# Patient Record
Sex: Female | Born: 1985 | Race: White | Hispanic: No | Smoking: Former smoker
Health system: Southern US, Community
[De-identification: ages and names within clinical notes are randomized; demographics above are authoritative.]

## PROBLEM LIST (undated history)

## (undated) ENCOUNTER — Inpatient Hospital Stay: Payer: Self-pay

## (undated) ENCOUNTER — Ambulatory Visit: Admission: EM | Payer: Medicaid Other | Source: Home / Self Care

## (undated) DIAGNOSIS — D509 Iron deficiency anemia, unspecified: Secondary | ICD-10-CM

## (undated) DIAGNOSIS — G43909 Migraine, unspecified, not intractable, without status migrainosus: Secondary | ICD-10-CM

## (undated) DIAGNOSIS — M179 Osteoarthritis of knee, unspecified: Secondary | ICD-10-CM

## (undated) DIAGNOSIS — O24419 Gestational diabetes mellitus in pregnancy, unspecified control: Secondary | ICD-10-CM

## (undated) DIAGNOSIS — F1110 Opioid abuse, uncomplicated: Secondary | ICD-10-CM

## (undated) DIAGNOSIS — R519 Headache, unspecified: Secondary | ICD-10-CM

## (undated) DIAGNOSIS — R51 Headache: Secondary | ICD-10-CM

## (undated) DIAGNOSIS — M2241 Chondromalacia patellae, right knee: Secondary | ICD-10-CM

## (undated) DIAGNOSIS — F32A Depression, unspecified: Secondary | ICD-10-CM

## (undated) HISTORY — DX: Depression, unspecified: F32.A

## (undated) HISTORY — DX: Morbid (severe) obesity due to excess calories: E66.01

## (undated) HISTORY — DX: Chondromalacia patellae, right knee: M22.41

## (undated) HISTORY — DX: Iron deficiency anemia, unspecified: D50.9

## (undated) HISTORY — DX: Osteoarthritis of knee, unspecified: M17.9

## (undated) HISTORY — DX: Migraine, unspecified, not intractable, without status migrainosus: G43.909

## (undated) HISTORY — DX: Gestational diabetes mellitus in pregnancy, unspecified control: O24.419

---

## 2005-03-03 ENCOUNTER — Ambulatory Visit: Payer: Self-pay | Admitting: Otolaryngology

## 2005-12-07 HISTORY — PX: TONSILLECTOMY: SUR1361

## 2007-03-24 ENCOUNTER — Emergency Department: Payer: Self-pay | Admitting: Internal Medicine

## 2007-07-05 ENCOUNTER — Emergency Department: Payer: Self-pay | Admitting: Emergency Medicine

## 2007-08-01 ENCOUNTER — Emergency Department: Payer: Self-pay | Admitting: Emergency Medicine

## 2008-03-17 ENCOUNTER — Other Ambulatory Visit: Payer: Self-pay

## 2008-03-17 ENCOUNTER — Emergency Department: Payer: Self-pay | Admitting: Emergency Medicine

## 2008-03-23 ENCOUNTER — Emergency Department: Payer: Self-pay | Admitting: Emergency Medicine

## 2008-10-17 ENCOUNTER — Emergency Department: Payer: Self-pay | Admitting: Emergency Medicine

## 2008-10-19 ENCOUNTER — Ambulatory Visit: Payer: Self-pay | Admitting: Emergency Medicine

## 2008-10-19 ENCOUNTER — Emergency Department: Payer: Self-pay | Admitting: Emergency Medicine

## 2008-10-31 ENCOUNTER — Emergency Department: Payer: Self-pay | Admitting: Internal Medicine

## 2008-11-02 ENCOUNTER — Ambulatory Visit: Payer: Self-pay | Admitting: Unknown Physician Specialty

## 2009-01-11 ENCOUNTER — Inpatient Hospital Stay: Payer: Self-pay | Admitting: Psychiatry

## 2009-04-01 ENCOUNTER — Emergency Department: Payer: Self-pay | Admitting: Unknown Physician Specialty

## 2009-07-06 ENCOUNTER — Emergency Department: Payer: Self-pay | Admitting: Emergency Medicine

## 2009-08-04 ENCOUNTER — Emergency Department: Payer: Self-pay | Admitting: Emergency Medicine

## 2009-09-02 ENCOUNTER — Observation Stay: Payer: Self-pay | Admitting: Surgery

## 2009-09-15 ENCOUNTER — Observation Stay: Payer: Self-pay

## 2009-10-07 ENCOUNTER — Encounter: Payer: Self-pay | Admitting: Maternal & Fetal Medicine

## 2009-10-23 ENCOUNTER — Observation Stay: Payer: Self-pay | Admitting: Obstetrics and Gynecology

## 2009-11-09 ENCOUNTER — Emergency Department: Payer: Self-pay | Admitting: Emergency Medicine

## 2009-11-25 ENCOUNTER — Ambulatory Visit: Payer: Self-pay

## 2009-12-07 ENCOUNTER — Ambulatory Visit: Payer: Self-pay

## 2009-12-21 ENCOUNTER — Observation Stay: Payer: Self-pay | Admitting: Obstetrics and Gynecology

## 2009-12-31 ENCOUNTER — Observation Stay: Payer: Self-pay

## 2010-01-07 ENCOUNTER — Ambulatory Visit: Payer: Self-pay

## 2010-01-15 ENCOUNTER — Observation Stay: Payer: Self-pay | Admitting: Obstetrics and Gynecology

## 2010-01-19 ENCOUNTER — Inpatient Hospital Stay: Payer: Self-pay | Admitting: Obstetrics and Gynecology

## 2010-06-01 ENCOUNTER — Emergency Department: Payer: Self-pay | Admitting: Emergency Medicine

## 2010-06-02 ENCOUNTER — Emergency Department: Payer: Self-pay | Admitting: Emergency Medicine

## 2010-06-03 ENCOUNTER — Emergency Department: Payer: Self-pay

## 2010-06-09 ENCOUNTER — Emergency Department: Payer: Self-pay | Admitting: Emergency Medicine

## 2010-08-20 ENCOUNTER — Emergency Department: Payer: Self-pay | Admitting: Emergency Medicine

## 2010-09-26 ENCOUNTER — Emergency Department: Payer: Self-pay | Admitting: Emergency Medicine

## 2010-10-26 ENCOUNTER — Emergency Department: Payer: Self-pay | Admitting: Emergency Medicine

## 2010-11-09 ENCOUNTER — Emergency Department: Payer: Self-pay | Admitting: Unknown Physician Specialty

## 2010-12-07 HISTORY — PX: ANKLE SURGERY: SHX546

## 2011-03-15 ENCOUNTER — Emergency Department: Payer: Self-pay | Admitting: Emergency Medicine

## 2011-03-20 ENCOUNTER — Inpatient Hospital Stay: Payer: Self-pay | Admitting: Psychiatry

## 2011-04-30 ENCOUNTER — Emergency Department: Payer: Self-pay | Admitting: Emergency Medicine

## 2011-05-18 ENCOUNTER — Emergency Department: Payer: Self-pay | Admitting: Unknown Physician Specialty

## 2011-05-22 ENCOUNTER — Emergency Department: Payer: Self-pay | Admitting: Emergency Medicine

## 2011-05-26 ENCOUNTER — Emergency Department: Payer: Self-pay | Admitting: *Deleted

## 2011-06-08 ENCOUNTER — Emergency Department: Payer: Self-pay | Admitting: Emergency Medicine

## 2011-06-16 ENCOUNTER — Emergency Department: Payer: Self-pay | Admitting: Emergency Medicine

## 2011-07-26 ENCOUNTER — Inpatient Hospital Stay: Payer: Self-pay | Admitting: Psychiatry

## 2011-08-04 ENCOUNTER — Inpatient Hospital Stay: Payer: Self-pay | Admitting: Psychiatry

## 2011-08-19 ENCOUNTER — Emergency Department: Payer: Self-pay | Admitting: *Deleted

## 2011-08-23 ENCOUNTER — Inpatient Hospital Stay: Payer: Self-pay | Admitting: Psychiatry

## 2011-09-25 ENCOUNTER — Emergency Department: Payer: Self-pay | Admitting: Emergency Medicine

## 2011-09-28 ENCOUNTER — Inpatient Hospital Stay: Payer: Self-pay | Admitting: Unknown Physician Specialty

## 2011-10-11 ENCOUNTER — Emergency Department: Payer: Self-pay | Admitting: Emergency Medicine

## 2011-12-08 HISTORY — PX: DILATION AND CURETTAGE OF UTERUS: SHX78

## 2011-12-31 ENCOUNTER — Emergency Department: Payer: Self-pay | Admitting: Emergency Medicine

## 2011-12-31 LAB — COMPREHENSIVE METABOLIC PANEL
Alkaline Phosphatase: 68 U/L (ref 50–136)
BUN: 11 mg/dL (ref 7–18)
Bilirubin,Total: 0.3 mg/dL (ref 0.2–1.0)
Calcium, Total: 8.9 mg/dL (ref 8.5–10.1)
Chloride: 107 mmol/L (ref 98–107)
Co2: 26 mmol/L (ref 21–32)
Creatinine: 0.9 mg/dL (ref 0.60–1.30)
EGFR (Non-African Amer.): >60
Glucose: 85 mg/dL (ref 65–99)
SGOT(AST): 25 U/L (ref 15–37)
SGPT (ALT): 35 U/L
Sodium: 142 mmol/L (ref 136–145)

## 2011-12-31 LAB — URINALYSIS, COMPLETE
Bacteria: NONE SEEN
Blood: NEGATIVE
Glucose,UR: NEGATIVE mg/dL (ref 0–75)
Leukocyte Esterase: NEGATIVE
Nitrite: NEGATIVE
Ph: 6 (ref 4.5–8.0)
Protein: NEGATIVE
Specific Gravity: 1.02 (ref 1.003–1.030)
WBC UR: 1 /HPF (ref 0–5)

## 2011-12-31 LAB — CBC
HCT: 41.2 % (ref 35.0–47.0)
MCHC: 32.7 g/dL (ref 32.0–36.0)
Platelet: 260 10*3/uL (ref 150–440)
RBC: 4.79 10*6/uL (ref 3.80–5.20)
RDW: 17 % — ABNORMAL HIGH (ref 11.5–14.5)
WBC: 8 10*3/uL (ref 3.6–11.0)

## 2011-12-31 LAB — DRUG SCREEN, URINE
Amphetamines, Ur Screen: NEGATIVE (ref ?–1000)
Cocaine Metabolite,Ur ~~LOC~~: NEGATIVE (ref ?–300)
MDMA (Ecstasy)Ur Screen: NEGATIVE (ref ?–500)
Methadone, Ur Screen: NEGATIVE (ref ?–300)
Opiate, Ur Screen: NEGATIVE (ref ?–300)
Phencyclidine (PCP) Ur S: POSITIVE (ref ?–25)
Tricyclic, Ur Screen: NEGATIVE (ref ?–1000)

## 2011-12-31 LAB — ETHANOL: Ethanol %: <0.003 % (ref 0.000–0.080)

## 2011-12-31 LAB — TSH: Thyroid Stimulating Horm: 1.7 u[IU]/mL

## 2012-01-21 LAB — URINALYSIS, COMPLETE
Bilirubin,UR: NEGATIVE
Blood: NEGATIVE
Glucose,UR: NEGATIVE mg/dL (ref 0–75)
Leukocyte Esterase: NEGATIVE
Nitrite: NEGATIVE
Protein: NEGATIVE
WBC UR: <1 /HPF (ref 0–5)

## 2012-01-21 LAB — TSH: Thyroid Stimulating Horm: 2.45 u[IU]/mL

## 2012-01-21 LAB — COMPREHENSIVE METABOLIC PANEL
Albumin: 4.6 g/dL (ref 3.4–5.0)
Alkaline Phosphatase: 68 U/L (ref 50–136)
BUN: 12 mg/dL (ref 7–18)
Bilirubin,Total: 0.3 mg/dL (ref 0.2–1.0)
Creatinine: 0.97 mg/dL (ref 0.60–1.30)
EGFR (Non-African Amer.): >60
Glucose: 76 mg/dL (ref 65–99)
Osmolality: 282 (ref 275–301)
Sodium: 142 mmol/L (ref 136–145)
Total Protein: 8.7 g/dL — ABNORMAL HIGH (ref 6.4–8.2)

## 2012-01-21 LAB — CBC
HCT: 45.6 % (ref 35.0–47.0)
HGB: 14.9 g/dL (ref 12.0–16.0)
MCH: 27.9 pg (ref 26.0–34.0)
MCHC: 32.6 g/dL (ref 32.0–36.0)
RDW: 17.6 % — ABNORMAL HIGH (ref 11.5–14.5)
WBC: 11.1 10*3/uL — ABNORMAL HIGH (ref 3.6–11.0)

## 2012-01-21 LAB — DRUG SCREEN, URINE
Amphetamines, Ur Screen: NEGATIVE (ref ?–1000)
Barbiturates, Ur Screen: NEGATIVE (ref ?–200)
Benzodiazepine, Ur Scrn: NEGATIVE (ref ?–200)
MDMA (Ecstasy)Ur Screen: NEGATIVE (ref ?–500)
Methadone, Ur Screen: NEGATIVE (ref ?–300)
Phencyclidine (PCP) Ur S: POSITIVE (ref ?–25)
Tricyclic, Ur Screen: POSITIVE (ref ?–1000)

## 2012-01-21 LAB — ETHANOL
Ethanol %: <0.003 % (ref 0.000–0.080)
Ethanol: <3 mg/dL

## 2012-01-21 LAB — SALICYLATE LEVEL: Salicylates, Serum: 2.9 mg/dL — ABNORMAL HIGH

## 2012-01-21 LAB — PREGNANCY, URINE: Pregnancy Test, Urine: NEGATIVE m[IU]/mL

## 2012-01-22 ENCOUNTER — Inpatient Hospital Stay: Payer: Self-pay | Admitting: Psychiatry

## 2012-02-11 ENCOUNTER — Emergency Department: Payer: Self-pay | Admitting: Emergency Medicine

## 2012-04-15 ENCOUNTER — Ambulatory Visit: Payer: Self-pay | Admitting: Specialist

## 2012-04-20 ENCOUNTER — Ambulatory Visit: Payer: Self-pay | Admitting: Specialist

## 2012-04-20 LAB — HEMOGLOBIN: HGB: 11.7 g/dL — ABNORMAL LOW (ref 12.0–16.0)

## 2012-04-20 LAB — DRUG SCREEN, URINE
Benzodiazepine, Ur Scrn: NEGATIVE (ref ?–200)
Cannabinoid 50 Ng, Ur ~~LOC~~: NEGATIVE (ref ?–50)
Cocaine Metabolite,Ur ~~LOC~~: NEGATIVE (ref ?–300)
MDMA (Ecstasy)Ur Screen: POSITIVE (ref ?–500)
Tricyclic, Ur Screen: NEGATIVE (ref ?–1000)

## 2012-04-20 LAB — PREGNANCY, URINE: Pregnancy Test, Urine: NEGATIVE m[IU]/mL

## 2012-04-21 LAB — HEMATOCRIT: HCT: 36.2 % (ref 35.0–47.0)

## 2012-05-26 IMAGING — US US PELV - US TRANSVAGINAL
1 series · 17 of 25 positions shown · non-contrast
Comparison: none

REASON FOR EXAM: left lower quadrant pain, please eval for torsion with
doppler flow
COMMENTS:

PROCEDURE:     US  - US PELVIS EXAM W/TRANSVAGINAL  - June 10, 2010  [DATE]
RESULT:
HISTORY: Left lower quadrant pain.

[Series 1: us pelv - us transvaginal · 17 of 57 slices shown]
[im 1/57]
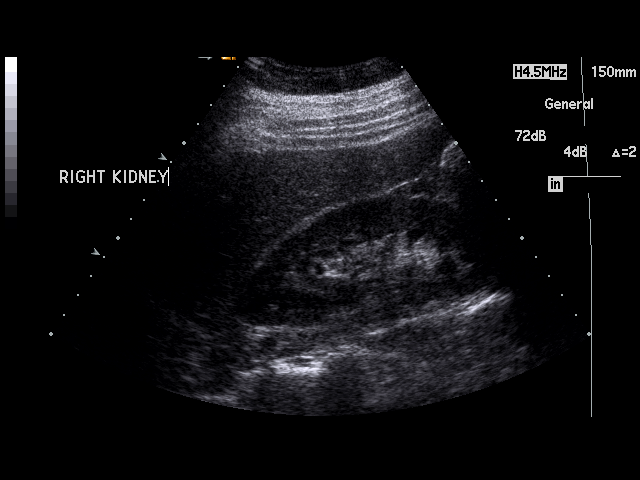
[im 5/57]
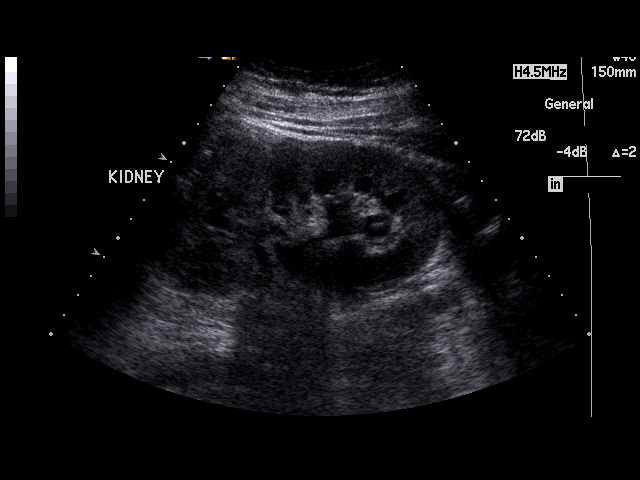
[im 8/57]
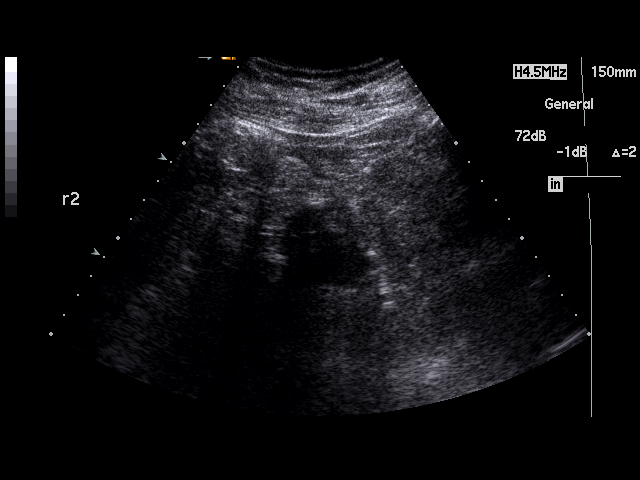
[im 12/57]
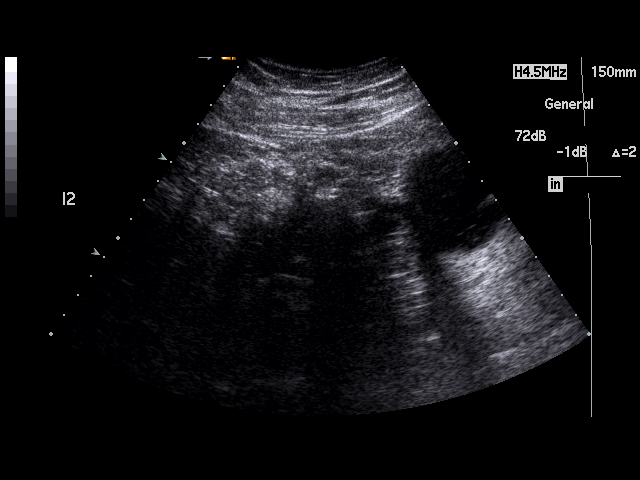
[im 15/57]
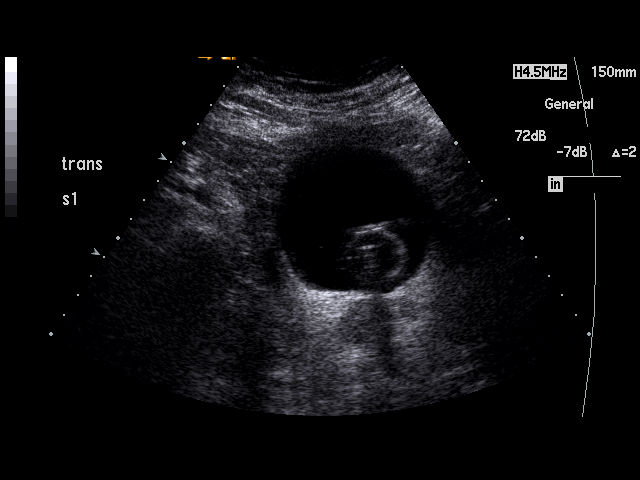
[im 19/57]
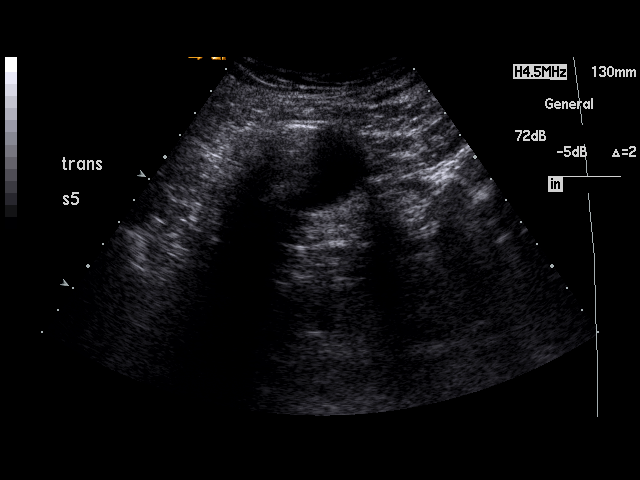
[im 22/57]
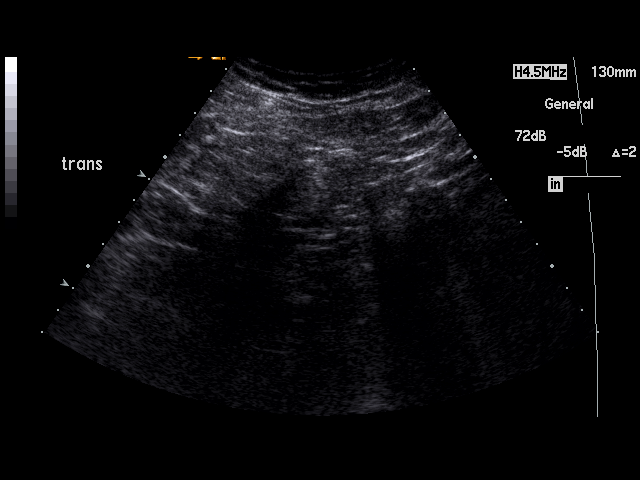
[im 26/57]
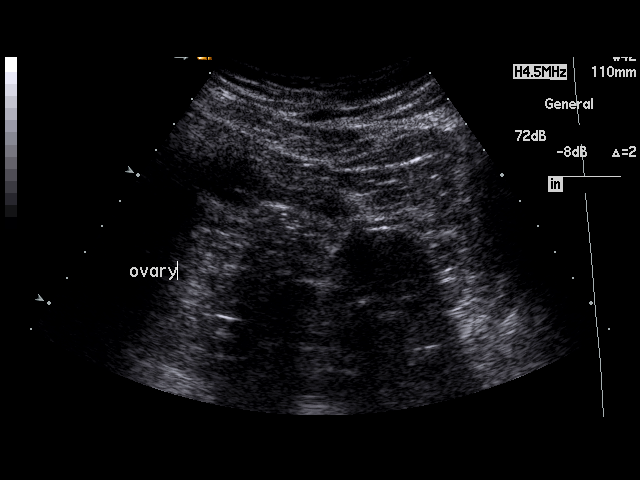
[im 29/57]
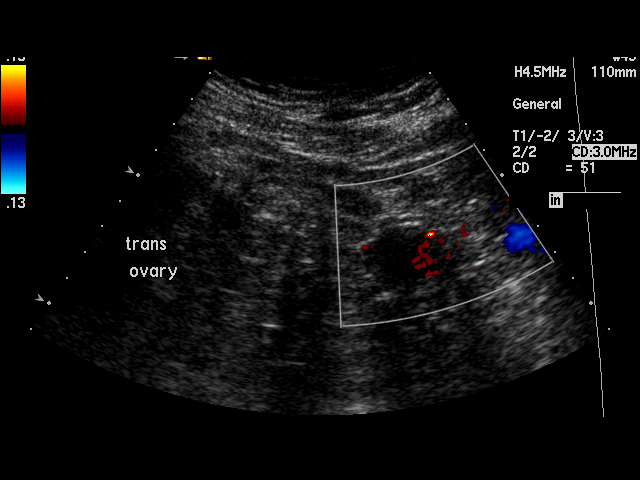
[im 31/57]
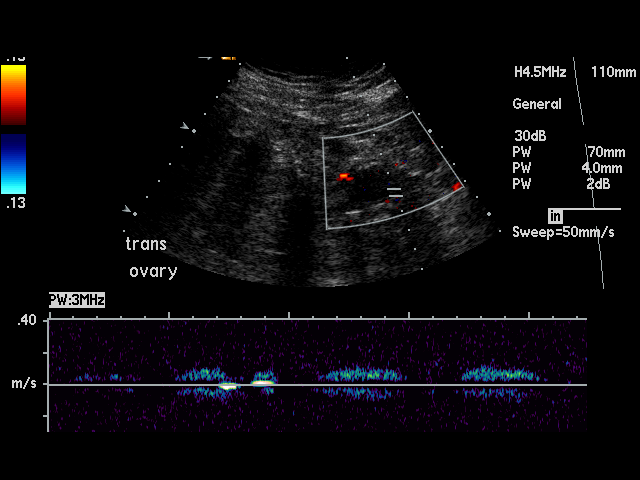
[im 36/57]
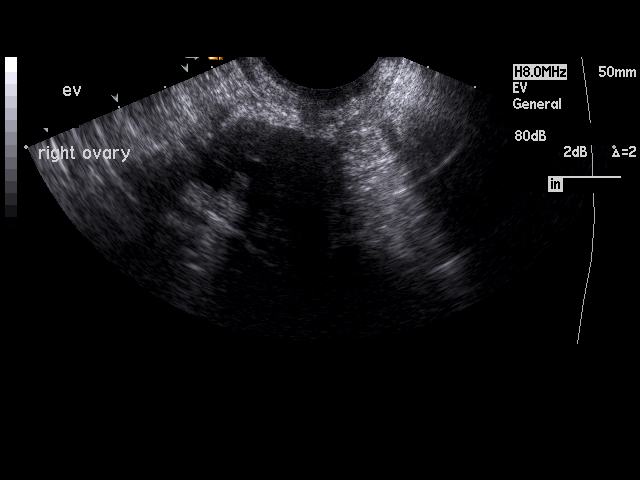
[im 38/57]
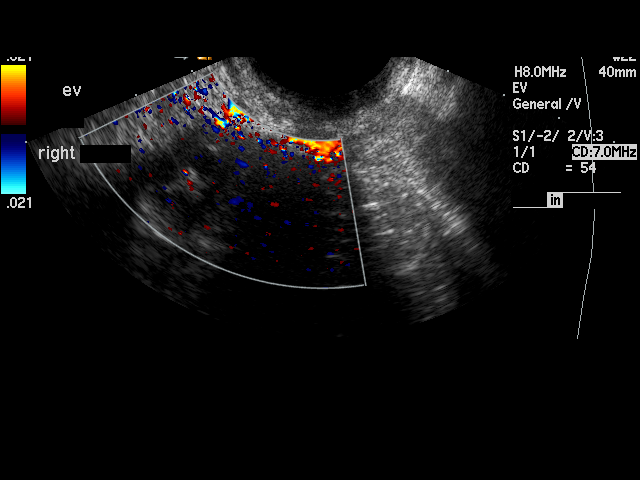
[im 43/57]
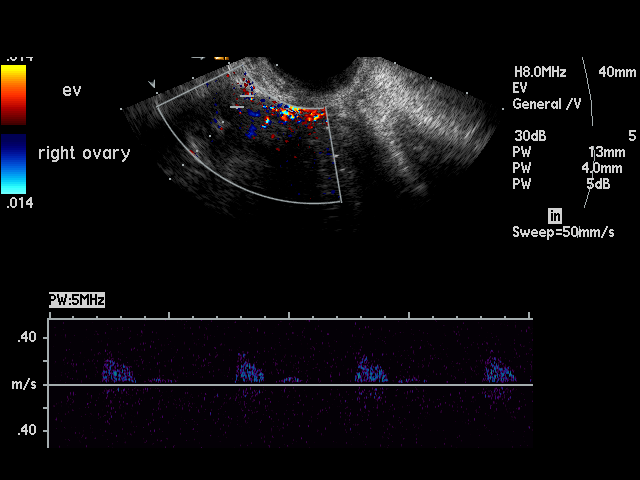
[im 45/57]
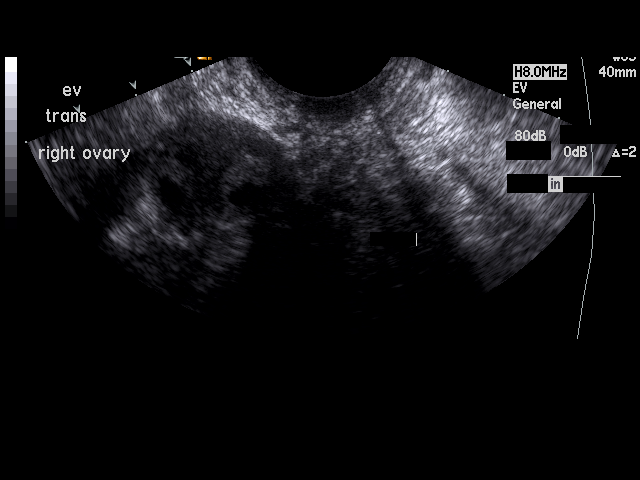
[im 50/57]
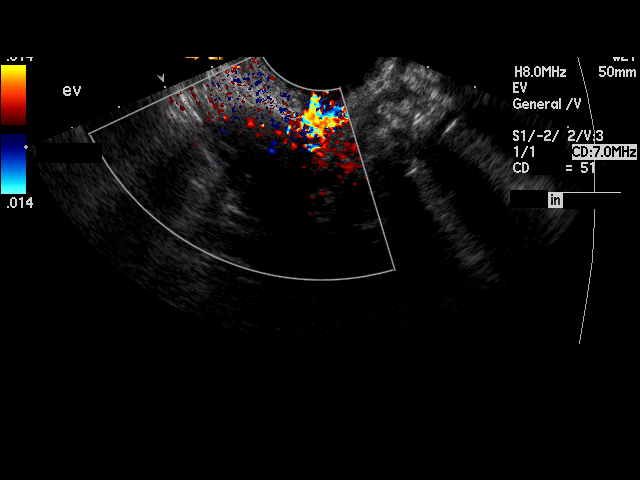
[im 52/57]
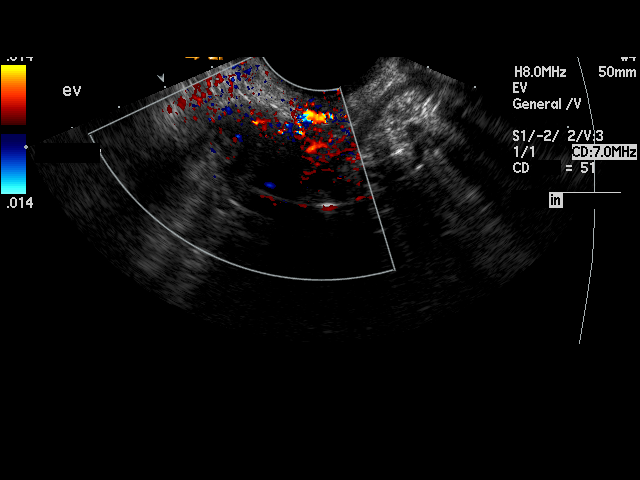
[im 57/57]
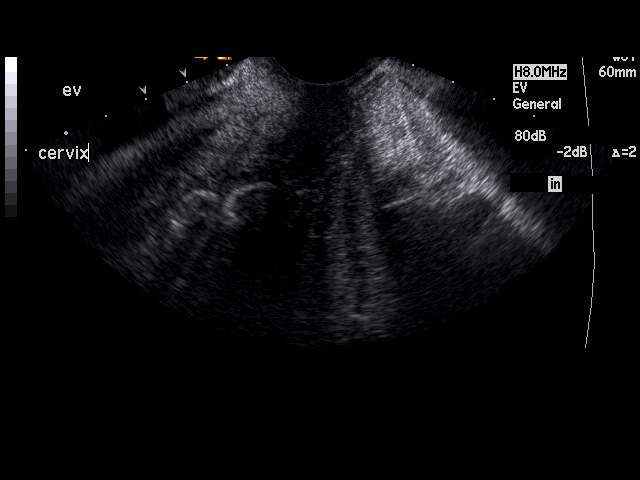

[17 of 25 positions shown; findings below may reference images not displayed]

FINDINGS: The uterus measures 8.1 x 3.5 x 4.6 cm. The endometrial stripe
is 3 mm and is normal. No free fluid is noted. The right ovary measures
cm in maximum diameter and the left 2.5. No abnormal flow signal is noted.
No evidence of torsion. Minimal hydronephrosis is noted in the left kidney
with a tiny punctate calcification in the lower pole most likely tiny renal
calculus.
IMPRESSION: 1. Normal pelvic ultrasound No evidence of torsion.
2. Mild hydronephrosis on the left with tiny punctate left lower pole
calyceal stone.

## 2012-06-30 ENCOUNTER — Encounter: Payer: Self-pay | Admitting: Obstetrics & Gynecology

## 2012-07-06 ENCOUNTER — Ambulatory Visit: Payer: Self-pay | Admitting: Obstetrics and Gynecology

## 2012-07-06 LAB — CBC
HCT: 39.3 % (ref 35.0–47.0)
HGB: 13.3 g/dL (ref 12.0–16.0)
MCHC: 33.8 g/dL (ref 32.0–36.0)
MCV: 86 fL (ref 80–100)
WBC: 8.6 10*3/uL (ref 3.6–11.0)

## 2012-07-06 LAB — COMPREHENSIVE METABOLIC PANEL
Albumin: 3.5 g/dL (ref 3.4–5.0)
Alkaline Phosphatase: 76 U/L (ref 50–136)
Anion Gap: 4 — ABNORMAL LOW (ref 7–16)
BUN: 8 mg/dL (ref 7–18)
Calcium, Total: 8.7 mg/dL (ref 8.5–10.1)
Glucose: 84 mg/dL (ref 65–99)
Osmolality: 277 (ref 275–301)
Potassium: 3.8 mmol/L (ref 3.5–5.1)
SGOT(AST): 20 U/L (ref 15–37)
SGPT (ALT): 23 U/L
Total Protein: 7 g/dL (ref 6.4–8.2)

## 2012-07-07 ENCOUNTER — Ambulatory Visit: Payer: Self-pay | Admitting: Obstetrics and Gynecology

## 2012-07-27 LAB — COMPREHENSIVE METABOLIC PANEL
Albumin: 4.2 g/dL (ref 3.4–5.0)
Alkaline Phosphatase: 98 U/L (ref 50–136)
Anion Gap: 4 — ABNORMAL LOW (ref 7–16)
BUN: 12 mg/dL (ref 7–18)
Bilirubin,Total: 0.3 mg/dL (ref 0.2–1.0)
EGFR (African American): >60
Glucose: 81 mg/dL (ref 65–99)
Potassium: 3.7 mmol/L (ref 3.5–5.1)
SGOT(AST): 77 U/L — ABNORMAL HIGH (ref 15–37)
SGPT (ALT): 69 U/L (ref 12–78)
Total Protein: 8.2 g/dL (ref 6.4–8.2)

## 2012-07-27 LAB — URINALYSIS, COMPLETE
Blood: NEGATIVE
Nitrite: NEGATIVE
Ph: 5 (ref 4.5–8.0)
Protein: NEGATIVE
Specific Gravity: 1.038 (ref 1.003–1.030)
WBC UR: 3 /HPF (ref 0–5)

## 2012-07-27 LAB — CBC
HGB: 13.8 g/dL (ref 12.0–16.0)
MCH: 28.3 pg (ref 26.0–34.0)
MCHC: 32.7 g/dL (ref 32.0–36.0)
MCV: 86 fL (ref 80–100)
RDW: 15.7 % — ABNORMAL HIGH (ref 11.5–14.5)
WBC: 11 10*3/uL (ref 3.6–11.0)

## 2012-07-27 LAB — DRUG SCREEN, URINE
Benzodiazepine, Ur Scrn: POSITIVE (ref ?–200)
Cannabinoid 50 Ng, Ur ~~LOC~~: NEGATIVE (ref ?–50)
Cocaine Metabolite,Ur ~~LOC~~: NEGATIVE (ref ?–300)
MDMA (Ecstasy)Ur Screen: NEGATIVE (ref ?–500)
Opiate, Ur Screen: POSITIVE (ref ?–300)
Phencyclidine (PCP) Ur S: POSITIVE (ref ?–25)

## 2012-07-27 LAB — ETHANOL
Ethanol %: <0.003 % (ref 0.000–0.080)
Ethanol: <3 mg/dL

## 2012-07-27 LAB — TSH: Thyroid Stimulating Horm: 2.47 u[IU]/mL

## 2012-07-27 LAB — ACETAMINOPHEN LEVEL: Acetaminophen: <2 ug/mL

## 2012-07-28 ENCOUNTER — Inpatient Hospital Stay: Payer: Self-pay | Admitting: Psychiatry

## 2012-07-29 LAB — LIPID PANEL
Ldl Cholesterol, Calc: 98 mg/dL (ref 0–100)
VLDL Cholesterol, Calc: 70 mg/dL — ABNORMAL HIGH (ref 5–40)

## 2012-07-29 LAB — FOLATE: Folic Acid: 18.9 ng/mL (ref 3.1–100.0)

## 2012-08-26 ENCOUNTER — Emergency Department: Payer: Self-pay | Admitting: Emergency Medicine

## 2012-08-26 LAB — URINALYSIS, COMPLETE
Bacteria: NONE SEEN
Blood: NEGATIVE
Glucose,UR: 50 mg/dL (ref 0–75)
Leukocyte Esterase: NEGATIVE
Nitrite: NEGATIVE
RBC,UR: <1 /HPF (ref 0–5)
Specific Gravity: 1.015 (ref 1.003–1.030)
WBC UR: 1 /HPF (ref 0–5)

## 2012-08-26 LAB — COMPREHENSIVE METABOLIC PANEL
Albumin: 3.1 g/dL — ABNORMAL LOW (ref 3.4–5.0)
Alkaline Phosphatase: 86 U/L (ref 50–136)
Anion Gap: 10 (ref 7–16)
Calcium, Total: 8.7 mg/dL (ref 8.5–10.1)
Chloride: 106 mmol/L (ref 98–107)
Creatinine: 0.74 mg/dL (ref 0.60–1.30)
EGFR (Non-African Amer.): >60
Glucose: 122 mg/dL — ABNORMAL HIGH (ref 65–99)
Osmolality: 278 (ref 275–301)
Potassium: 3.8 mmol/L (ref 3.5–5.1)
SGOT(AST): 25 U/L (ref 15–37)
SGPT (ALT): 47 U/L (ref 12–78)
Sodium: 139 mmol/L (ref 136–145)

## 2012-08-26 LAB — CBC
MCHC: 33.7 g/dL (ref 32.0–36.0)
RBC: 4.15 10*6/uL (ref 3.80–5.20)
RDW: 15.7 % — ABNORMAL HIGH (ref 11.5–14.5)
WBC: 7.4 10*3/uL (ref 3.6–11.0)

## 2012-08-29 ENCOUNTER — Emergency Department: Payer: Self-pay | Admitting: Emergency Medicine

## 2012-08-29 LAB — COMPREHENSIVE METABOLIC PANEL
Alkaline Phosphatase: 110 U/L (ref 50–136)
Anion Gap: 12 (ref 7–16)
Calcium, Total: 9.1 mg/dL (ref 8.5–10.1)
Chloride: 100 mmol/L (ref 98–107)
Co2: 24 mmol/L (ref 21–32)
Creatinine: 0.93 mg/dL (ref 0.60–1.30)
SGOT(AST): 51 U/L — ABNORMAL HIGH (ref 15–37)
SGPT (ALT): 47 U/L (ref 12–78)
Total Protein: 8.6 g/dL — ABNORMAL HIGH (ref 6.4–8.2)

## 2012-08-29 LAB — DRUG SCREEN, URINE
Amphetamines, Ur Screen: NEGATIVE (ref ?–1000)
Benzodiazepine, Ur Scrn: NEGATIVE (ref ?–200)
Cocaine Metabolite,Ur ~~LOC~~: NEGATIVE (ref ?–300)
MDMA (Ecstasy)Ur Screen: NEGATIVE (ref ?–500)
Methadone, Ur Screen: NEGATIVE (ref ?–300)
Opiate, Ur Screen: NEGATIVE (ref ?–300)
Phencyclidine (PCP) Ur S: NEGATIVE (ref ?–25)

## 2012-08-29 LAB — TSH: Thyroid Stimulating Horm: 3.65 u[IU]/mL

## 2012-08-29 LAB — CBC
HGB: 13.7 g/dL (ref 12.0–16.0)
MCH: 28.1 pg (ref 26.0–34.0)
MCV: 82 fL (ref 80–100)
Platelet: 381 10*3/uL (ref 150–440)
RBC: 4.86 10*6/uL (ref 3.80–5.20)
WBC: 15.5 10*3/uL — ABNORMAL HIGH (ref 3.6–11.0)

## 2012-08-29 LAB — ACETAMINOPHEN LEVEL: Acetaminophen: <2 ug/mL

## 2012-08-29 LAB — ETHANOL: Ethanol: <3 mg/dL

## 2012-08-29 LAB — PREGNANCY, URINE: Pregnancy Test, Urine: NEGATIVE m[IU]/mL

## 2012-10-02 ENCOUNTER — Emergency Department: Payer: Self-pay | Admitting: *Deleted

## 2012-10-10 ENCOUNTER — Emergency Department: Payer: Self-pay | Admitting: Emergency Medicine

## 2012-10-10 LAB — COMPREHENSIVE METABOLIC PANEL
BUN: 4 mg/dL — ABNORMAL LOW (ref 7–18)
Bilirubin,Total: 0.5 mg/dL (ref 0.2–1.0)
Chloride: 106 mmol/L (ref 98–107)
Co2: 27 mmol/L (ref 21–32)
Creatinine: 0.71 mg/dL (ref 0.60–1.30)
EGFR (African American): >60
EGFR (Non-African Amer.): >60
Osmolality: 277 (ref 275–301)
Potassium: 3.7 mmol/L (ref 3.5–5.1)
SGOT(AST): 18 U/L (ref 15–37)
SGPT (ALT): 35 U/L (ref 12–78)
Sodium: 141 mmol/L (ref 136–145)

## 2012-10-10 LAB — CBC
HGB: 12.6 g/dL (ref 12.0–16.0)
MCHC: 32.1 g/dL (ref 32.0–36.0)
MCV: 84 fL (ref 80–100)
RBC: 4.7 10*6/uL (ref 3.80–5.20)

## 2012-10-10 LAB — PROTIME-INR: Prothrombin Time: 13.2 secs (ref 11.5–14.7)

## 2012-10-30 ENCOUNTER — Emergency Department: Payer: Self-pay | Admitting: Emergency Medicine

## 2012-10-30 LAB — URINALYSIS, COMPLETE
Bilirubin,UR: NEGATIVE
Blood: NEGATIVE
Glucose,UR: NEGATIVE mg/dL (ref 0–75)
Nitrite: NEGATIVE
Protein: NEGATIVE
RBC,UR: 4 /HPF (ref 0–5)
Squamous Epithelial: 7
WBC UR: 16 /HPF (ref 0–5)

## 2012-10-30 LAB — DRUG SCREEN, URINE
Barbiturates, Ur Screen: NEGATIVE (ref ?–200)
Cannabinoid 50 Ng, Ur ~~LOC~~: POSITIVE (ref ?–50)
Cocaine Metabolite,Ur ~~LOC~~: POSITIVE (ref ?–300)
MDMA (Ecstasy)Ur Screen: NEGATIVE (ref ?–500)
Phencyclidine (PCP) Ur S: NEGATIVE (ref ?–25)
Tricyclic, Ur Screen: NEGATIVE (ref ?–1000)

## 2012-10-30 LAB — CBC WITH DIFFERENTIAL/PLATELET
Basophil %: 1.3 %
Eosinophil %: 1.1 %
HCT: 41 % (ref 35.0–47.0)
HGB: 13.5 g/dL (ref 12.0–16.0)
MCH: 26.9 pg (ref 26.0–34.0)
MCHC: 32.9 g/dL (ref 32.0–36.0)
MCV: 82 fL (ref 80–100)
Monocyte #: 0.5 x10 3/mm (ref 0.2–0.9)
Monocyte %: 5.5 %
Neutrophil #: 6 10*3/uL (ref 1.4–6.5)
Neutrophil %: 65.9 %
Platelet: 338 10*3/uL (ref 150–440)
WBC: 9.1 10*3/uL (ref 3.6–11.0)

## 2012-10-30 LAB — COMPREHENSIVE METABOLIC PANEL
Albumin: 4.3 g/dL (ref 3.4–5.0)
Alkaline Phosphatase: 80 U/L (ref 50–136)
BUN: 9 mg/dL (ref 7–18)
Bilirubin,Total: 1 mg/dL (ref 0.2–1.0)
Calcium, Total: 9.2 mg/dL (ref 8.5–10.1)
Co2: 26 mmol/L (ref 21–32)
Creatinine: 0.74 mg/dL (ref 0.60–1.30)
EGFR (Non-African Amer.): >60
Osmolality: 274 (ref 275–301)
SGOT(AST): 93 U/L — ABNORMAL HIGH (ref 15–37)
SGPT (ALT): 132 U/L — ABNORMAL HIGH (ref 12–78)
Total Protein: 8 g/dL (ref 6.4–8.2)

## 2012-12-07 DIAGNOSIS — F1210 Cannabis abuse, uncomplicated: Secondary | ICD-10-CM

## 2012-12-07 HISTORY — DX: Cannabis abuse, uncomplicated: F12.10

## 2013-02-19 ENCOUNTER — Emergency Department: Payer: Self-pay | Admitting: Emergency Medicine

## 2013-03-04 IMAGING — CT CT MAXILLOFACIAL WITHOUT CONTRAST
1 series · 16 of 30 positions shown, 20 images · non-contrast
Comparison: none

REASON FOR EXAM: pain around L orbit s/p trauma last wk with intermittent
double vision
COMMENTS:

PROCEDURE:     CT  - CT MAXILLOFACIAL AREA WO  - March 19, 2011 [DATE]
RESULT:
TECHNIQUE: Coronal and sagittal imaging of the maxillofacial region was
obtained utilizing helical 3 mm acquisition and bone reconstruction
algorithm.

[Series 2: facial 3.0 h60f · axial · 0.33mm/px · z∈[+1016,+1162]mm · 16 of 53 slices shown, 20 images]
[im 2/53  brain]
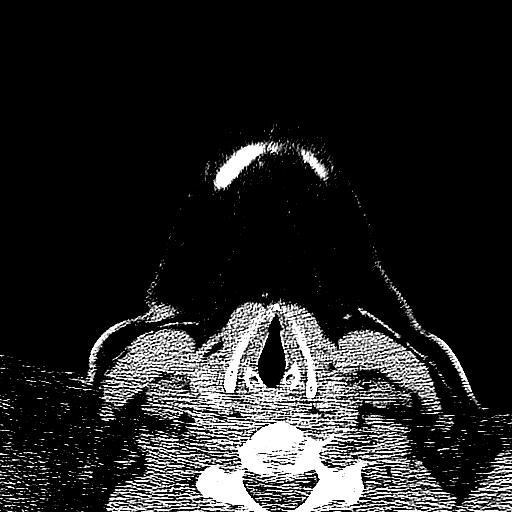
[im 2/53  bone]
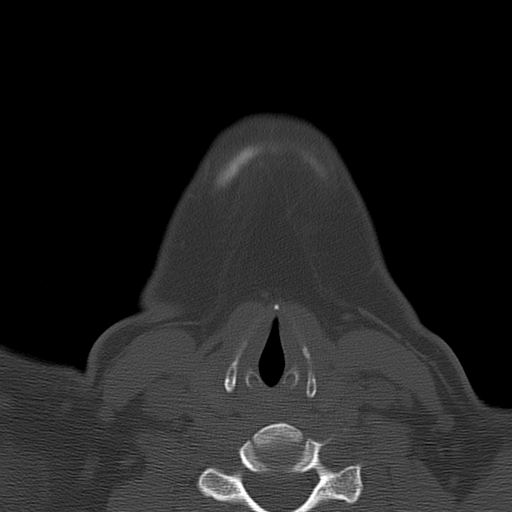
[im 6/53  bone]
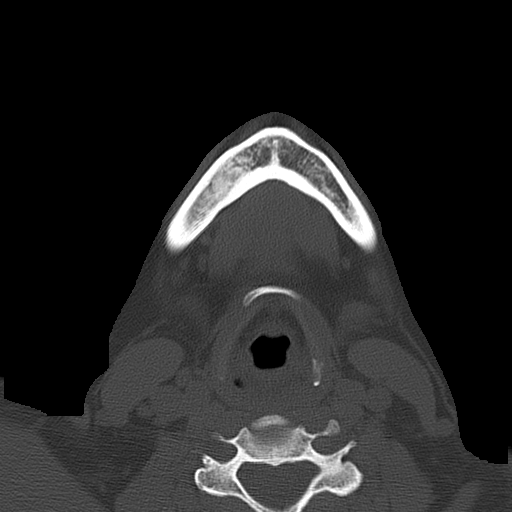
[im 9/53  bone]
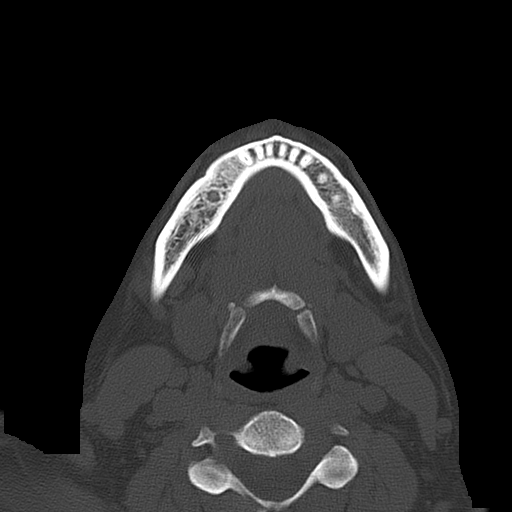
[im 13/53  bone]
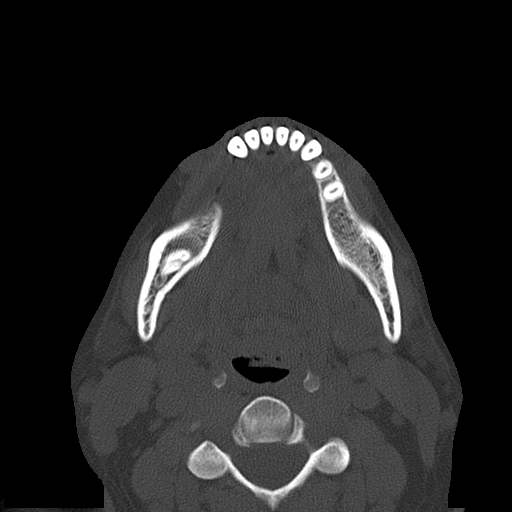
[im 15/53  brain]
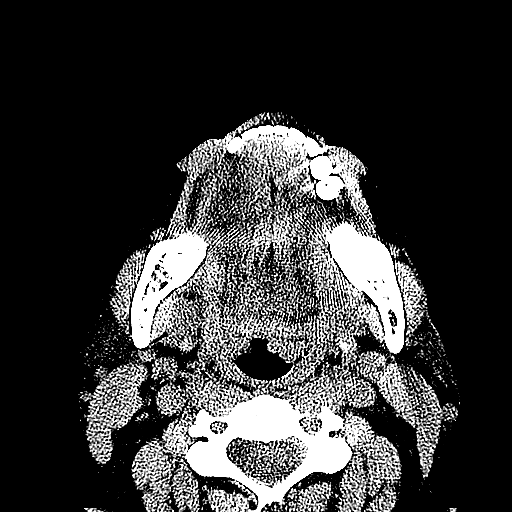
[im 15/53  bone]
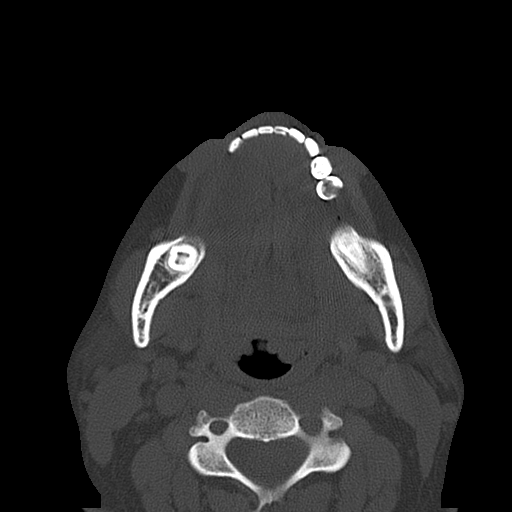
[im 18/53  bone]
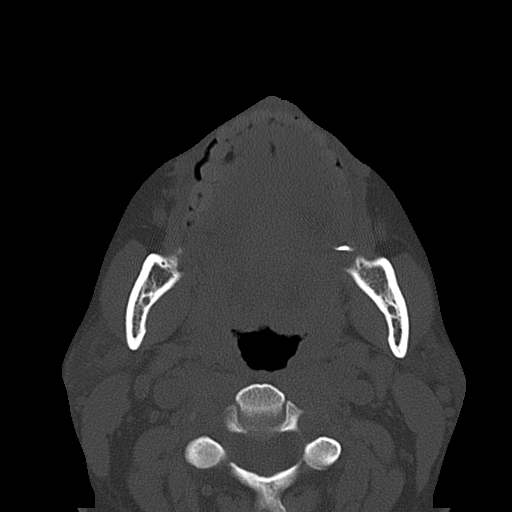
[im 22/53  bone]
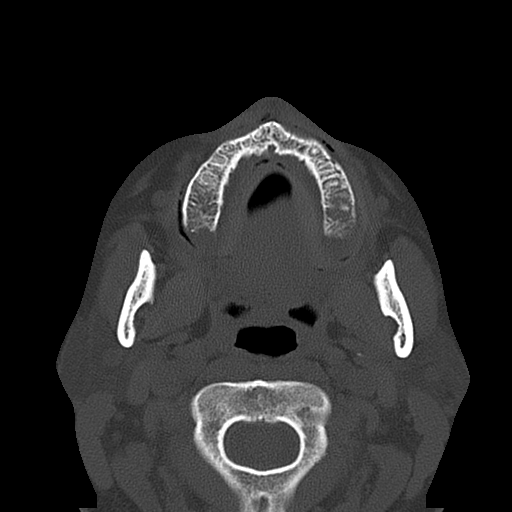
[im 26/53  bone]
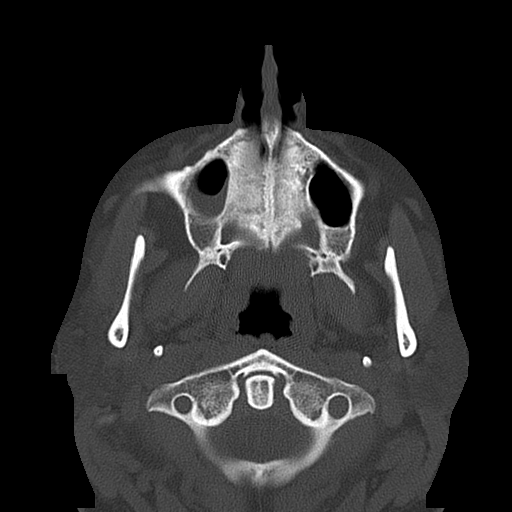
[im 27/53  brain]
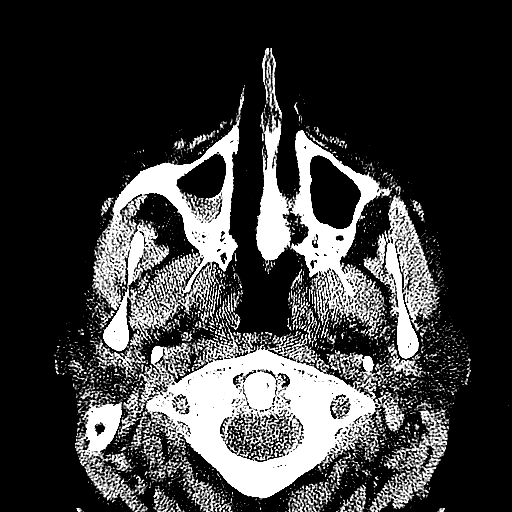
[im 27/53  bone]
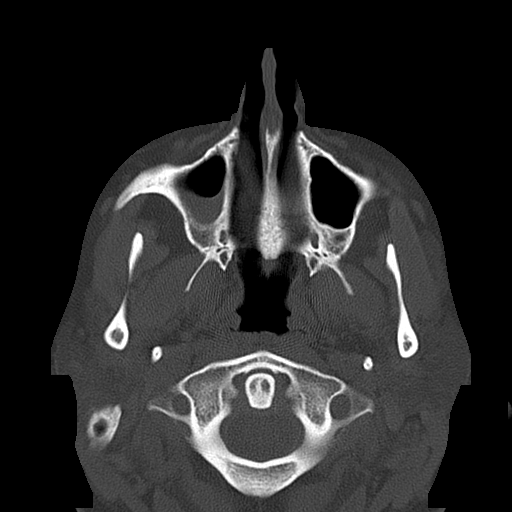
[im 31/53  bone]
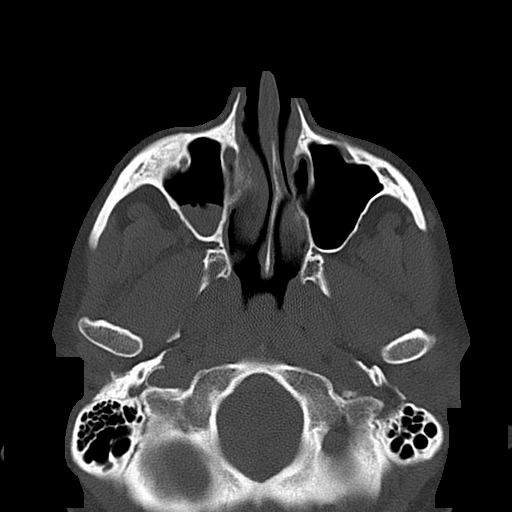
[im 35/53  bone]
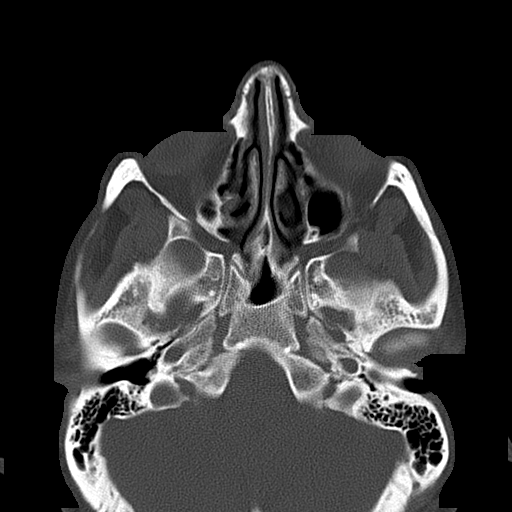
[im 38/53  bone]
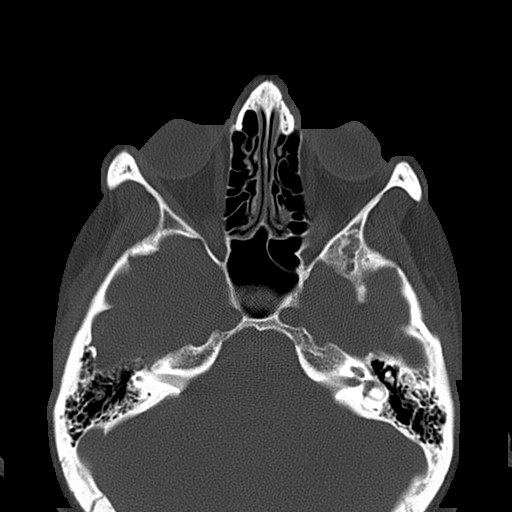
[im 40/53  brain]
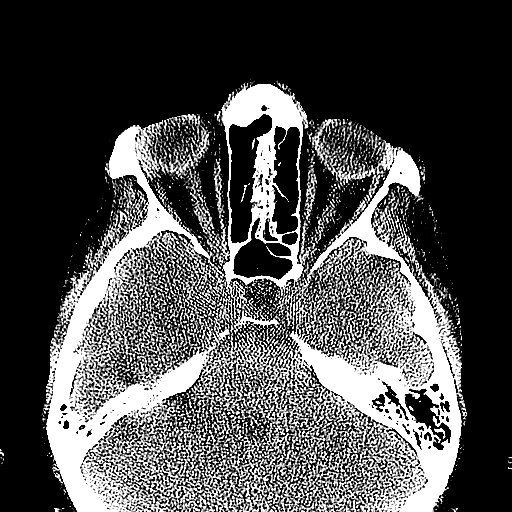
[im 40/53  bone]
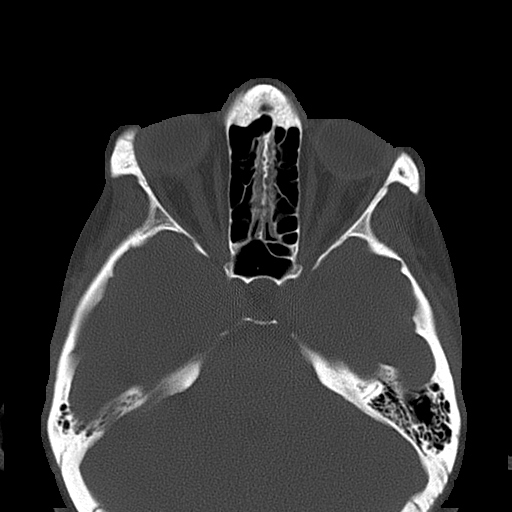
[im 44/53  bone]
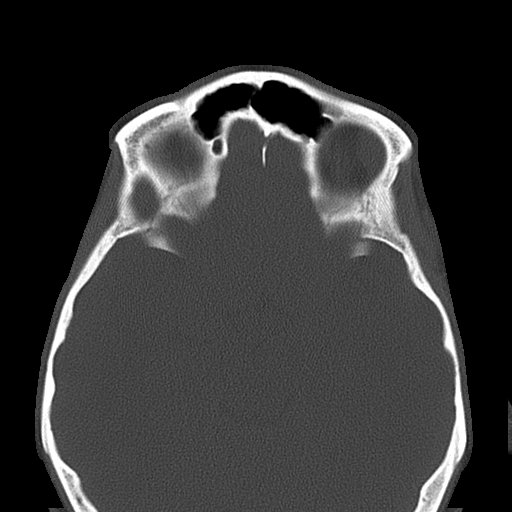
[im 47/53  bone]
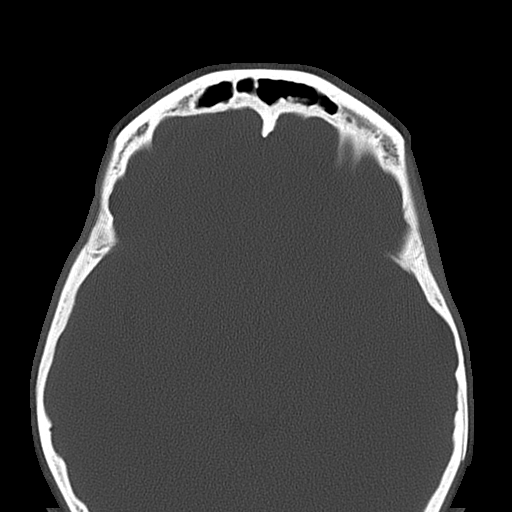
[im 51/53  bone]
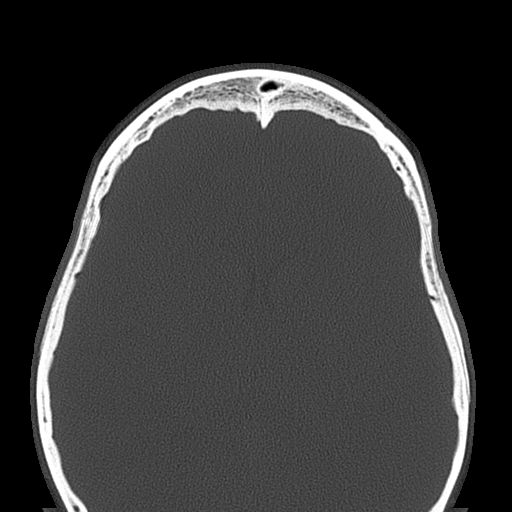

[16 of 30 positions shown; findings below may reference images not displayed]

FINDINGS: Evaluation of the osseous structures demonstrates no evidence of
fracture, dislocation or malalignment. An air-fluid level is identified
within the right maxillary sinus. The ostiomeatal complexes appear intact.
The remaining paranasal sinuses are unremarkable.
IMPRESSION: 1.  No CT evidence of acute osseous abnormalities.
2.  Findings suspicious for sinus disease within the right maxillary sinus.
3.  Dr. Battles of the Emergency Department was informed of these findings
via a preliminary faxed report.

## 2013-04-16 DIAGNOSIS — F603 Borderline personality disorder: Secondary | ICD-10-CM | POA: Insufficient documentation

## 2013-04-16 DIAGNOSIS — R4589 Other symptoms and signs involving emotional state: Secondary | ICD-10-CM | POA: Insufficient documentation

## 2013-04-16 DIAGNOSIS — F1410 Cocaine abuse, uncomplicated: Secondary | ICD-10-CM | POA: Insufficient documentation

## 2013-04-16 DIAGNOSIS — F1210 Cannabis abuse, uncomplicated: Secondary | ICD-10-CM | POA: Insufficient documentation

## 2013-04-16 HISTORY — DX: Borderline personality disorder: F60.3

## 2013-05-25 ENCOUNTER — Inpatient Hospital Stay: Payer: Self-pay | Admitting: Psychiatry

## 2013-05-25 LAB — CBC
HCT: 37.5 % (ref 35.0–47.0)
MCH: 27.9 pg (ref 26.0–34.0)
MCV: 85 fL (ref 80–100)
Platelet: 256 10*3/uL (ref 150–440)
RBC: 4.41 10*6/uL (ref 3.80–5.20)
RDW: 15.4 % — ABNORMAL HIGH (ref 11.5–14.5)
WBC: 12.3 10*3/uL — ABNORMAL HIGH (ref 3.6–11.0)

## 2013-05-25 LAB — ETHANOL
Ethanol %: <0.003 % (ref 0.000–0.080)
Ethanol: <3 mg/dL

## 2013-05-25 LAB — URINALYSIS, COMPLETE
Blood: NEGATIVE
Glucose,UR: NEGATIVE mg/dL (ref 0–75)
Ketone: NEGATIVE
Nitrite: NEGATIVE
Ph: 5 (ref 4.5–8.0)
Specific Gravity: 1.027 (ref 1.003–1.030)
WBC UR: 6 /HPF (ref 0–5)

## 2013-05-25 LAB — DRUG SCREEN, URINE
Amphetamines, Ur Screen: NEGATIVE (ref ?–1000)
Cannabinoid 50 Ng, Ur ~~LOC~~: NEGATIVE (ref ?–50)
Cocaine Metabolite,Ur ~~LOC~~: POSITIVE (ref ?–300)
Phencyclidine (PCP) Ur S: POSITIVE (ref ?–25)
Tricyclic, Ur Screen: NEGATIVE (ref ?–1000)

## 2013-05-25 LAB — COMPREHENSIVE METABOLIC PANEL
BUN: 11 mg/dL (ref 7–18)
Bilirubin,Total: 0.3 mg/dL (ref 0.2–1.0)
Calcium, Total: 8.4 mg/dL — ABNORMAL LOW (ref 8.5–10.1)
Chloride: 112 mmol/L — ABNORMAL HIGH (ref 98–107)
Co2: 28 mmol/L (ref 21–32)
Creatinine: 1 mg/dL (ref 0.60–1.30)
EGFR (African American): >60
EGFR (Non-African Amer.): >60
Osmolality: 281 (ref 275–301)
Potassium: 3.8 mmol/L (ref 3.5–5.1)
Total Protein: 6.7 g/dL (ref 6.4–8.2)

## 2013-05-25 LAB — SALICYLATE LEVEL: Salicylates, Serum: 3.5 mg/dL — ABNORMAL HIGH

## 2013-05-26 LAB — BEHAVIORAL MEDICINE 1 PANEL
Albumin: 2.9 g/dL — ABNORMAL LOW (ref 3.4–5.0)
Alkaline Phosphatase: 71 U/L (ref 50–136)
Anion Gap: 2 — ABNORMAL LOW (ref 7–16)
BUN: 15 mg/dL (ref 7–18)
Basophil #: 0 10*3/uL (ref 0.0–0.1)
Basophil %: 0.7 %
Bilirubin,Total: 0.2 mg/dL (ref 0.2–1.0)
Calcium, Total: 8.1 mg/dL — ABNORMAL LOW (ref 8.5–10.1)
Chloride: 110 mmol/L — ABNORMAL HIGH (ref 98–107)
Co2: 30 mmol/L (ref 21–32)
Creatinine: 0.78 mg/dL (ref 0.60–1.30)
EGFR (African American): >60
EGFR (Non-African Amer.): >60
Eosinophil #: 0.2 10*3/uL (ref 0.0–0.7)
Eosinophil %: 3.1 %
Glucose: 81 mg/dL (ref 65–99)
HCT: 35.3 % (ref 35.0–47.0)
HGB: 11.5 g/dL — ABNORMAL LOW (ref 12.0–16.0)
Lymphocyte #: 3.1 10*3/uL (ref 1.0–3.6)
Lymphocyte %: 41.3 %
MCH: 27.6 pg (ref 26.0–34.0)
MCHC: 32.5 g/dL (ref 32.0–36.0)
MCV: 85 fL (ref 80–100)
Monocyte #: 0.5 x10 3/mm (ref 0.2–0.9)
Monocyte %: 6.8 %
Neutrophil #: 3.6 10*3/uL (ref 1.4–6.5)
Neutrophil %: 48.1 %
Osmolality: 283 (ref 275–301)
Platelet: 208 10*3/uL (ref 150–440)
Potassium: 4.2 mmol/L (ref 3.5–5.1)
RBC: 4.15 10*6/uL (ref 3.80–5.20)
RDW: 15.3 % — ABNORMAL HIGH (ref 11.5–14.5)
SGOT(AST): 20 U/L (ref 15–37)
SGPT (ALT): 25 U/L (ref 12–78)
Sodium: 142 mmol/L (ref 136–145)
Thyroid Stimulating Horm: 1.37 u[IU]/mL
Total Protein: 5.7 g/dL — ABNORMAL LOW (ref 6.4–8.2)
WBC: 7.4 10*3/uL (ref 3.6–11.0)

## 2013-05-27 LAB — URINALYSIS, COMPLETE
Bilirubin,UR: NEGATIVE
Blood: NEGATIVE
Glucose,UR: NEGATIVE mg/dL (ref 0–75)
Ketone: NEGATIVE
Leukocyte Esterase: NEGATIVE
Nitrite: NEGATIVE
Ph: 7 (ref 4.5–8.0)
Protein: NEGATIVE
RBC,UR: NONE SEEN /HPF (ref 0–5)
Specific Gravity: 1.017 (ref 1.003–1.030)
Squamous Epithelial: 6
WBC UR: 2 /HPF (ref 0–5)

## 2013-06-13 ENCOUNTER — Inpatient Hospital Stay: Payer: Self-pay | Admitting: Psychiatry

## 2013-06-13 LAB — URINALYSIS, COMPLETE
Bilirubin,UR: NEGATIVE
Nitrite: NEGATIVE
Ph: 5 (ref 4.5–8.0)
Squamous Epithelial: <1
WBC UR: 26 /HPF (ref 0–5)

## 2013-06-13 LAB — COMPREHENSIVE METABOLIC PANEL
Albumin: 3.6 g/dL (ref 3.4–5.0)
Alkaline Phosphatase: 75 U/L (ref 50–136)
Chloride: 108 mmol/L — ABNORMAL HIGH (ref 98–107)
EGFR (African American): >60
EGFR (Non-African Amer.): >60
Glucose: 88 mg/dL (ref 65–99)
Osmolality: 279 (ref 275–301)
Potassium: 3.3 mmol/L — ABNORMAL LOW (ref 3.5–5.1)
SGPT (ALT): 35 U/L (ref 12–78)
Total Protein: 7.4 g/dL (ref 6.4–8.2)

## 2013-06-13 LAB — APTT: Activated PTT: 26.3 secs (ref 23.6–35.9)

## 2013-06-13 LAB — CBC
HCT: 35.9 % (ref 35.0–47.0)
HGB: 11.2 g/dL — ABNORMAL LOW (ref 12.0–16.0)
MCH: 26.2 pg (ref 26.0–34.0)
MCHC: 31.2 g/dL — ABNORMAL LOW (ref 32.0–36.0)
RBC: 4.28 10*6/uL (ref 3.80–5.20)
WBC: 11.8 10*3/uL — ABNORMAL HIGH (ref 3.6–11.0)

## 2013-06-13 LAB — SALICYLATE LEVEL: Salicylates, Serum: <1.7 mg/dL

## 2013-06-13 LAB — DRUG SCREEN, URINE
Barbiturates, Ur Screen: NEGATIVE (ref ?–200)
Benzodiazepine, Ur Scrn: POSITIVE (ref ?–200)
Cannabinoid 50 Ng, Ur ~~LOC~~: NEGATIVE (ref ?–50)
MDMA (Ecstasy)Ur Screen: POSITIVE (ref ?–500)
Methadone, Ur Screen: NEGATIVE (ref ?–300)
Phencyclidine (PCP) Ur S: POSITIVE (ref ?–25)
Tricyclic, Ur Screen: POSITIVE (ref ?–1000)

## 2013-06-13 LAB — ACETAMINOPHEN LEVEL: Acetaminophen: <2 ug/mL

## 2013-06-13 LAB — ETHANOL: Ethanol: <3 mg/dL

## 2013-06-14 LAB — BEHAVIORAL MEDICINE 1 PANEL
Albumin: 3.6 g/dL (ref 3.4–5.0)
Alkaline Phosphatase: 79 U/L (ref 50–136)
Anion Gap: 5 — ABNORMAL LOW (ref 7–16)
BUN: 12 mg/dL (ref 7–18)
Basophil #: 0 10*3/uL (ref 0.0–0.1)
Basophil %: 0.6 %
Bilirubin,Total: 0.3 mg/dL (ref 0.2–1.0)
Calcium, Total: 8.5 mg/dL (ref 8.5–10.1)
Chloride: 107 mmol/L (ref 98–107)
Co2: 29 mmol/L (ref 21–32)
Creatinine: 0.95 mg/dL (ref 0.60–1.30)
EGFR (African American): >60
EGFR (Non-African Amer.): >60
Eosinophil #: 0.1 10*3/uL (ref 0.0–0.7)
Eosinophil %: 2 %
Glucose: 93 mg/dL (ref 65–99)
HCT: 37.4 % (ref 35.0–47.0)
HGB: 12.2 g/dL (ref 12.0–16.0)
Lymphocyte #: 3.2 10*3/uL (ref 1.0–3.6)
Lymphocyte %: 43.7 %
MCH: 27.4 pg (ref 26.0–34.0)
MCHC: 32.5 g/dL (ref 32.0–36.0)
MCV: 84 fL (ref 80–100)
Monocyte #: 0.4 x10 3/mm (ref 0.2–0.9)
Monocyte %: 5.4 %
Neutrophil #: 3.5 10*3/uL (ref 1.4–6.5)
Neutrophil %: 48.3 %
Osmolality: 281 (ref 275–301)
Platelet: 280 10*3/uL (ref 150–440)
Potassium: 3.9 mmol/L (ref 3.5–5.1)
RBC: 4.43 10*6/uL (ref 3.80–5.20)
RDW: 16 % — ABNORMAL HIGH (ref 11.5–14.5)
SGOT(AST): 17 U/L (ref 15–37)
SGPT (ALT): 32 U/L (ref 12–78)
Sodium: 141 mmol/L (ref 136–145)
Thyroid Stimulating Horm: 1.91 u[IU]/mL
Total Protein: 6.9 g/dL (ref 6.4–8.2)
WBC: 7.3 10*3/uL (ref 3.6–11.0)

## 2013-09-14 ENCOUNTER — Emergency Department: Payer: Self-pay | Admitting: Emergency Medicine

## 2013-09-14 LAB — BASIC METABOLIC PANEL
Anion Gap: 4 — ABNORMAL LOW (ref 7–16)
BUN: 8 mg/dL (ref 7–18)
Chloride: 106 mmol/L (ref 98–107)
Glucose: 106 mg/dL — ABNORMAL HIGH (ref 65–99)
Osmolality: 274 (ref 275–301)
Potassium: 3.9 mmol/L (ref 3.5–5.1)

## 2013-09-14 LAB — CBC
HCT: 34.4 % — ABNORMAL LOW (ref 35.0–47.0)
HGB: 11.3 g/dL — ABNORMAL LOW (ref 12.0–16.0)
MCH: 25.5 pg — ABNORMAL LOW (ref 26.0–34.0)
RBC: 4.42 10*6/uL (ref 3.80–5.20)

## 2014-01-10 ENCOUNTER — Ambulatory Visit: Payer: Self-pay

## 2014-01-31 ENCOUNTER — Encounter: Payer: Self-pay | Admitting: General Surgery

## 2014-01-31 ENCOUNTER — Ambulatory Visit: Payer: Self-pay

## 2014-02-15 ENCOUNTER — Ambulatory Visit (INDEPENDENT_AMBULATORY_CARE_PROVIDER_SITE_OTHER): Payer: PRIVATE HEALTH INSURANCE | Admitting: General Surgery

## 2014-02-15 ENCOUNTER — Other Ambulatory Visit: Payer: PRIVATE HEALTH INSURANCE

## 2014-02-15 ENCOUNTER — Encounter: Payer: Self-pay | Admitting: General Surgery

## 2014-02-15 VITALS — BP 130/88 | HR 88 | Resp 14 | Ht 64.0 in | Wt 227.0 lb

## 2014-02-15 DIAGNOSIS — N63 Unspecified lump in unspecified breast: Secondary | ICD-10-CM

## 2014-02-15 NOTE — Patient Instructions (Addendum)
Continue self breast exams. Call office for any new breast issues or concerns. 

## 2014-02-15 NOTE — Progress Notes (Signed)
Patient ID: Ariana FischerMegan [name], female   DOB: December 18, 1985, 28 y.o.   MRN: [id number]  Chief Complaint  Patient presents with  . Breast Problem    right breast lump    HPI Ariana FischerMegan [name] is a 28 y.o. female.  who presents for a breast evaluation. States she found a right breast lump about one month ago below nipple area. It has gotten larger but no pain. Patient does perform regular self breast checks.  No family history breast cancer. Denies breast trauma.   HPI  History reviewed. No pertinent past medical history.  Past Surgical History  Procedure Laterality Date  . Tonsillectomy  2007  . Cesarean section  2011  . Ankle surgery Left 2012  . Dilation and curettage of uterus  2013    Family History  Problem Relation Age of Onset  . Cancer Mother     skin    Social History History  Substance Use Topics  . Smoking status: Current Every Day Smoker -- 1.00 packs/day for 13 years    Types: Cigarettes  . Smokeless tobacco: Not on file  . Alcohol Use: No    Allergies  Allergen Reactions  . Clindamycin/Lincomycin Anaphylaxis and Rash  . Amoxicillin Rash    Current Outpatient Prescriptions  Medication Sig Dispense Refill  . Buprenorphine HCl-Naloxone HCl (SUBOXONE SL) Place 6 mg under the tongue daily.      Marland Kitchen. Fexofenadine HCl (MUCINEX ALLERGY PO) Take by mouth as needed.      . ranitidine (ZANTAC) 150 MG tablet Take 150 mg by mouth daily.       No current facility-administered medications for this visit.    Review of Systems Review of Systems  Constitutional: Negative.   Respiratory: Negative.   Cardiovascular: Negative.     Blood pressure 130/88, pulse 88, resp. rate 14, height 5\' 4"  (1.626 m), weight 227 lb (102.967 kg), last menstrual period 02/14/2014.  Physical Exam Physical Exam  Constitutional: She is oriented to person, place, and time. She appears well-developed and well-nourished.  Eyes: No scleral icterus.  Neck: Neck supple.  Pulmonary/Chest: Right  breast exhibits no inverted nipple, no mass, no nipple discharge, no skin change and no tenderness. Left breast exhibits no inverted nipple, no mass, no nipple discharge, no skin change and no tenderness.  Irregular firm tissue right breast at 4 o'clock retroareolar, about 0.5 cm size.  No findings on left breast  Lymphadenopathy:    She has no cervical adenopathy.    She has no axillary adenopathy.  Neurological: She is alert and oriented to person, place, and time.  Skin: Skin is warm and dry.    Data Reviewed Targeted ultrasound right breast shows no findings.   Assessment    Likely benign finding in right breast, can be followed.     Plan    Follow up in 3 months with office visit.       Teretha Chalupa G 02/15/2014, 2:28 PM

## 2014-04-20 ENCOUNTER — Emergency Department: Payer: Self-pay | Admitting: Emergency Medicine

## 2014-04-20 LAB — CBC
HCT: 39.9 % (ref 35.0–47.0)
HGB: 13 g/dL (ref 12.0–16.0)
MCH: 27.2 pg (ref 26.0–34.0)
MCHC: 32.5 g/dL (ref 32.0–36.0)
MCV: 84 fL (ref 80–100)
PLATELETS: 260 10*3/uL (ref 150–440)
RBC: 4.77 10*6/uL (ref 3.80–5.20)
RDW: 15.8 % — ABNORMAL HIGH (ref 11.5–14.5)
WBC: 9.2 10*3/uL (ref 3.6–11.0)

## 2014-04-20 LAB — COMPREHENSIVE METABOLIC PANEL
Albumin: 3.8 g/dL (ref 3.4–5.0)
Alkaline Phosphatase: 98 U/L
Anion Gap: 7 (ref 7–16)
BILIRUBIN TOTAL: 0.4 mg/dL (ref 0.2–1.0)
BUN: 14 mg/dL (ref 7–18)
Calcium, Total: 8.9 mg/dL (ref 8.5–10.1)
Chloride: 108 mmol/L — ABNORMAL HIGH (ref 98–107)
Co2: 27 mmol/L (ref 21–32)
Creatinine: 0.91 mg/dL (ref 0.60–1.30)
EGFR (Non-African Amer.): >60
Glucose: 95 mg/dL (ref 65–99)
Osmolality: 283 (ref 275–301)
POTASSIUM: 3.7 mmol/L (ref 3.5–5.1)
SGOT(AST): 28 U/L (ref 15–37)
SGPT (ALT): 39 U/L (ref 12–78)
Sodium: 142 mmol/L (ref 136–145)
TOTAL PROTEIN: 7.3 g/dL (ref 6.4–8.2)

## 2014-04-20 LAB — DRUG SCREEN, URINE
Amphetamines, Ur Screen: NEGATIVE (ref ?–1000)
Barbiturates, Ur Screen: NEGATIVE (ref ?–200)
Benzodiazepine, Ur Scrn: POSITIVE (ref ?–200)
COCAINE METABOLITE, UR ~~LOC~~: NEGATIVE (ref ?–300)
Cannabinoid 50 Ng, Ur ~~LOC~~: NEGATIVE (ref ?–50)
MDMA (Ecstasy)Ur Screen: NEGATIVE (ref ?–500)
Methadone, Ur Screen: NEGATIVE (ref ?–300)
OPIATE, UR SCREEN: POSITIVE (ref ?–300)
Phencyclidine (PCP) Ur S: NEGATIVE (ref ?–25)
TRICYCLIC, UR SCREEN: NEGATIVE (ref ?–1000)

## 2014-04-20 LAB — ETHANOL: Ethanol %: <0.003 % (ref 0.000–0.080)

## 2014-04-20 LAB — TSH: THYROID STIMULATING HORM: 1.19 u[IU]/mL

## 2014-04-20 LAB — ACETAMINOPHEN LEVEL: Acetaminophen: <2 ug/mL

## 2014-04-20 LAB — SALICYLATE LEVEL: SALICYLATES, SERUM: 2.3 mg/dL

## 2014-04-28 ENCOUNTER — Emergency Department: Payer: Self-pay | Admitting: Emergency Medicine

## 2014-04-28 LAB — DRUG SCREEN, URINE
Amphetamines, Ur Screen: NEGATIVE (ref ?–1000)
BARBITURATES, UR SCREEN: NEGATIVE (ref ?–200)
Benzodiazepine, Ur Scrn: NEGATIVE (ref ?–200)
Cannabinoid 50 Ng, Ur ~~LOC~~: NEGATIVE (ref ?–50)
Cocaine Metabolite,Ur ~~LOC~~: NEGATIVE (ref ?–300)
MDMA (Ecstasy)Ur Screen: NEGATIVE (ref ?–500)
METHADONE, UR SCREEN: NEGATIVE (ref ?–300)
Opiate, Ur Screen: POSITIVE (ref ?–300)
Phencyclidine (PCP) Ur S: NEGATIVE (ref ?–25)
TRICYCLIC, UR SCREEN: NEGATIVE (ref ?–1000)

## 2014-04-28 LAB — URINALYSIS, COMPLETE
BILIRUBIN, UR: NEGATIVE
Bacteria: NONE SEEN
Blood: NEGATIVE
GLUCOSE, UR: NEGATIVE mg/dL (ref 0–75)
Ketone: NEGATIVE
Leukocyte Esterase: NEGATIVE
Nitrite: NEGATIVE
PH: 6 (ref 4.5–8.0)
PROTEIN: NEGATIVE
Specific Gravity: 1.017 (ref 1.003–1.030)
Squamous Epithelial: 2

## 2014-04-28 LAB — COMPREHENSIVE METABOLIC PANEL
ALBUMIN: 3.8 g/dL (ref 3.4–5.0)
AST: 20 U/L (ref 15–37)
Alkaline Phosphatase: 92 U/L
Anion Gap: 10 (ref 7–16)
BUN: 12 mg/dL (ref 7–18)
Bilirubin,Total: 0.5 mg/dL (ref 0.2–1.0)
CHLORIDE: 106 mmol/L (ref 98–107)
CO2: 24 mmol/L (ref 21–32)
Calcium, Total: 9.2 mg/dL (ref 8.5–10.1)
Creatinine: 0.79 mg/dL (ref 0.60–1.30)
EGFR (African American): >60
EGFR (Non-African Amer.): >60
Glucose: 117 mg/dL — ABNORMAL HIGH (ref 65–99)
Osmolality: 280 (ref 275–301)
Potassium: 3.9 mmol/L (ref 3.5–5.1)
SGPT (ALT): 36 U/L (ref 12–78)
Sodium: 140 mmol/L (ref 136–145)
Total Protein: 7.4 g/dL (ref 6.4–8.2)

## 2014-04-28 LAB — CBC
HCT: 40.6 % (ref 35.0–47.0)
HGB: 13.2 g/dL (ref 12.0–16.0)
MCH: 26.9 pg (ref 26.0–34.0)
MCHC: 32.4 g/dL (ref 32.0–36.0)
MCV: 83 fL (ref 80–100)
Platelet: 261 10*3/uL (ref 150–440)
RBC: 4.88 10*6/uL (ref 3.80–5.20)
RDW: 16 % — AB (ref 11.5–14.5)
WBC: 10.5 10*3/uL (ref 3.6–11.0)

## 2014-04-28 LAB — ETHANOL

## 2014-06-12 ENCOUNTER — Ambulatory Visit: Payer: PRIVATE HEALTH INSURANCE | Admitting: General Surgery

## 2014-06-14 ENCOUNTER — Encounter: Payer: Self-pay | Admitting: *Deleted

## 2014-10-08 ENCOUNTER — Encounter: Payer: Self-pay | Admitting: General Surgery

## 2015-03-26 NOTE — Op Note (Signed)
PATIENT NAME:  [name], [name] MR#:  [id number] DATE OF BIRTH:  01/10/1986  DATE OF PROCEDURE:  07/07/2012  PREOPERATIVE DIAGNOSIS: Missed abortion.   POSTOPERATIVE DIAGNOSIS: Missed abortion.   PROCEDURE: Suction D and C.  ANESTHESIA: General LMA.   SURGEON: Will Bonnet, MD   ESTIMATED BLOOD LOSS: 300 mL.  OPERATIVE FLUIDS: 1000 mL Crystalloid.   COMPLICATIONS: None.   FINDINGS:  1. 10 week size uterus.  2. Apparent products of conception.   SPECIMENS: Endometrial curettings and products of conception to pathology for permanent.   INDICATIONS FOR PROCEDURE: Talia is a 29 year old female who presented to clinic over the last several weeks for some vaginal spotting. Her quantitative hCG has been followed and was not rising correctly. She also has been followed using ultrasound and it was noted that she recently had an ultrasound with size of the fetal pole consistent with an eight week pregnancy with no fetal cardiac motion. She was explained the options for management and she chose to go to the operating room for surgical management.    PROCEDURE IN DETAIL: The patient was met in the preoperative area where the procedure was reviewed in detail. She was taken to the operating room and placed under general anesthesia which was found to be adequate. She was placed in the dorsal supine high lithotomy position and prepped and draped in the usual sterile fashion.   Her bladder was emptied using straight a catheter . A sterile speculum placed in the vagina and a single-tooth tenaculum was affixed to the anterior lip of the cervix. The uterus was sounded to 12 cm. Her cervix was then gently dilated to 11 mm using Hegar dilators. A #10 suction cannula was then introduced gently through the cervix until the fundus was reached. Approximately three passes were made with the suction curette. A sharp  curettage was then undertaken for two passes until a gritty texture was noted throughout.  Approximately two more passes were made with the suction cannula with continued return of products of conception. One other pass with the sharp curette was undertaken followed by one with the suction curette and this time minimal blood or products of conception were returned. All instrumentation was then removed from the vagina. Hemostasis was noted.   The patient tolerated the procedure well. Sponge, lap, and instrument counts were correct x2. For antibiotic prophylaxis she was given doxycycline 100 mg at the beginning of the procedure. She was awakened in the operating room and taken to the recovery area in stable condition.   ____________________________ Will Bonnet, MD sdj:drc D: 07/07/2012 18:48:20 ET T: 07/08/2012 10:41:08 ET JOB#: [id number]  cc: Will Bonnet, MD, <Dictator> Will Bonnet MD ELECTRONICALLY SIGNED 07/14/2012 9:18

## 2015-03-26 NOTE — Consult Note (Signed)
Referral Information:   Reason for Referral Medication use in pregnancy    Referring Physician [facility]    Prenatal Hx [name] is a 29 year-old G3 P1011 at 278 2/[redacted] weeks gestation who presents for genetic counseling and maternal-fetal medicine consultaiton regrading medication exposure in pregnancy.  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 Coricidin contains an antihistamine and dextromethorphan. She last took a recreational dose (entire box of Coricidin at once) about one week ago.   She also had been taking Adderall up until 1-2 weeks ago and trazadone up until 2 days ago. She takes Adderall to control ADHD symptoms and takes trazadone to help with sleep.  She has been in rehab for opioid use in the past but states she is no longer taking narcotics.    Past Obstetrical Hx G3 P1011 -Ectopic pregnancy -Full-term C-section for non-reassuring fetal status in 2011, female infant 6 pounds 13 ounces, complicated by diet-controlled gestational diabetes.   Home Medications: Medication Instructions Status  Prenatal Multivitamins oral tablet 1 tab(s) orally once a day Active   Allergies:   Amoxicillin: Rash  Clindamycin: Anaphylaxis, Resp. Distress  Vital Signs/Notes:  Nursing Vital Signs: **Vital Signs.:   25-Jul-13 13:12   Pulse Pulse 91   Systolic BP Systolic BP 117   Diastolic BP (mmHg) Diastolic BP (mmHg) 75   Perinatal Consult:   PGyn Hx Deneis history of abnormal paps or STDS    Past Medical History cont'd Denies history of hypertension.  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 C-section.    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 Denies ETOH or other drug use except for mentioned above.   Review Of Systems:   Subjective No complaints. Feels  safe at home.    Fever/Chills No    Cough No    Abdominal Pain No    Nausea/Vomiting No     Additional Lab/Radiology Notes Prenatal labs (06/14/12): O negative, antibody screen negative, Rubella immune, Varicella immune, HIV non-reactive, HEp b negative, RPR non-reactive, HCT 40.9, Plt 282  US ([facility] 06/20/12): Single live IUP at 6 6/7 weeks.   Impression/Recommendations:   Impression [name] is a 29 year-old G3 P1011 at 8 2/7 weeks with pregnancy complicated by Trazadone and Adderall exposure and recreational Coricidin abuse.    Recommendations See separately dictated genetic counseling consultation regarding detailed descriptions of potential fetal affects of trazadone, adderall and coricidin on the fetus. In addition, I explained the risks of each of these medications.   Trazadone is not expected to increase the risk for birth defects. It is unknown if trazadone affects fetal neurologic development. [name] uses it for sleep, is no longer taking it and states that she does not need it during the pregnancy.  Adderall is a stimulant and is in the amphetamine class of medications. It is associated with fetal growth restriction and may affect neonatal neurologic development/behavior. We discussed balancing risks of medications with need to control maternal ADHD symptoms. She has discontinued the medication and states that she does not require it to keep her symptoms in control.  Coricidin and dextromethorphan is not expected to increase the risk of congenital anomlies but at high doses (she takes the entire box at once), the risk is unknown.  We counseled  her on the need to avoid taking coricidin, especially in high doses. She states she understood.  Recs: -See Genetic counseling note -Offer CF carrier screen, aneuploidy and ONTD screening -Referral to psychiatry to address her ADHD symptoms and abuse of recreational medications -Detailed ultrasound at approxiamtely 18  weeks -Fetal growth scan at approximately 28 weeks in setting of Adderall exposure -Early glucola to screen for type II diabets -Blood type O negative. Will need Rhogam at 28 weeks and with bleeding episodes or invasive procedurs     Total Time Spent with Patient 60 minutes    >50% of visit spent in couseling/coordination of care yes    Office Use Only 99244  Level 4 ( ) NEW office consult low complexity   Coding Description: MATERNAL CONDITIONS/HISTORY INDICATION(S).   OTHER: Substance abuse.  Electronic Signatures: Shyenne Maggard, [place] (MD)  (Signed 25-Jul-13 16:37)  Authored: Referral, Home Medications, Allergies, Vital Signs/Notes, Consult, Exam, Lab/Radiology Notes, Impression, Billing, Coding Description   Last Updated: 25-Jul-13 16:37 by Passion Lavin, [place] (MD)
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
## 2015-03-26 NOTE — Consult Note (Signed)
[facility] kapur leg swelling and pain left >rightDVThad recent left ankle fracture s/p surgery in May 2013,pt has been mobile, no other recent illnessgive trial of HCTZ. start low dose leg elevation while in edhose knee high Polysubtance abuse/SI/OVerdose per Dr Maryruth BunKapur for the consult  Electronic Signatures: Willow OraPatel, Torres Hardenbrook A (MD)  (Signed on 27-Aug-13 14:35)  Authored  Last Updated: 27-Aug-13 14:35 by Willow OraPatel, Milee Qualls A (MD)
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
## 2015-03-31 NOTE — H&P (Signed)
PATIENT NAME:  [name], [name] MR#:  [id number] DATE OF BIRTH:  Apr 04, 1986  DATE OF ADMISSION:  01/22/2012  IDENTIFYING INFORMATION AND CHIEF COMPLAINT: 29 year old woman with a history of substance abuse and depression brought into the Emergency Room by her family because of escalating substance abuse and suicidal ideation.   CHIEF COMPLAINT: "I got to get off these pills."  HISTORY OF PRESENT ILLNESS: The patient reports that she has been abusing over-the-counter cold medicines, specifically Coricidin or "triple C's". She has been abusing them heavily just in the last two months. Yesterday, took about 27 pills in a day. She feels like her use has escalated to the point that it is out of control. She is aware that she has not been able to take care of her son properly. Her memory is bad. She has been behaving erratically. She started having some suicidal thoughts, feeling like she was a burden to her family and life was not worth living anymore. The patient says she has tried to stop on her own and feels like it is out of control. She has had some auditory and visual hallucinations while intoxicated. Says that she has not been drinking alcohol and denies that she has been abusing any other pills recently.   PAST PSYCHIATRIC HISTORY: The patient has had previous admissions to psychiatry for similar problems. She has been abusing various kinds of pills since she was about 29 years old. It started when she was given narcotics for some sort of medical problem and has grown since then. She has a history of abuse of narcotics, benzodiazepines, multiple over-the-counter medicines. She says basically "I'll take any pills I can get my hands on". She has been diagnosed with depression in the past but she admits that since she has only managed to ever stay sober for about two months at a time, it is a little hard to tell how much of the depression is endogenous versus substance induced. In any case, she has been  treated with antidepressants including Prozac and Remeron in the past and feels like they have not been of any benefit and have tended to cause her side effects. Recently, she has not been going to an outpatient provider for the last couple of months. She stopped going to [facility] a couple of months ago. Up until then she was taking trazodone at least at night for sleep. She does have a past history of suicide attempt by overdose in the past. Does not have a past history of violence to others. She has been to [facility] once previously within the last year.   PAST MEDICAL HISTORY: No real significant ongoing medical problems.   SOCIAL HISTORY: Lives with her husband and 67-year-old son. Husband is employed but they do not have any health insurance. The patient is not employed. Because of her substance use she has been ineffective in taking care of her son resulting in the mother-in-law having to be involved taking care of the child much of the time.   FAMILY HISTORY: Multiple family members with substance abuse and mental health problems.   CURRENT MEDICATIONS: None.   ALLERGIES: Amoxicillin and clindamycin.   REVIEW OF SYSTEMS: Complains of feeling tired. Mild achiness. No specific severe pain. No current gastrointestinal complaints. She feels like her memory has been bad. Her mood has been irritable and cranky recently. Not currently having active psychotic symptoms.   MENTAL STATUS EXAM: Disheveled woman interviewed in the Emergency Room. Cooperative and appropriate during the interview. Eye contact  was good. Psychomotor activity normal. A little bit fidgety, but not remarkably so. Speech normal rate, tone and volume. Affect is blunted, a little bit dysphoric, not tearful. Mood is stated as being nervous and sad and feeling guilty. Thoughts are lucid. No evidence of loosening of associations or bizarre thinking. Current short-term memory for immediate events is adequate. Denies acute hallucinations. No  acute psychotic thinking. Denies immediate suicidal and homicidal ideation, but within the last day has had suicidal thoughts and still has a lot of hopelessness and negative thinking. Feels like she might kill herself if she cannot get sober.   PHYSICAL EXAMINATION:  GENERAL: The patient is moderately overweight. Does not appear to be in any acute physical distress.   CURRENT VITAL SIGNS: Temperature of 96.1, pulse 98, respirations 20, blood pressure 117/77. Has an old healed scar on her left forearm evidently from a household accident, not self-inflicted. No other skin lesions.   HEENT: Pupils are equal and reactive, but generally enlarged. Face is symmetric.   NEUROLOGICAL: Cranial nerves all symmetric and intact. Gait within normal limits.   MUSCULOSKELETAL: Full range of motion in all extremities. Strength is normal and symmetric as are reflexes.   LUNGS: Clear to auscultation. No wheezes.   HEART: Regular rate and rhythm, normal sounds.   ABDOMEN: Soft, nontender, normal bowel sounds.   ASSESSMENT: 29 year old woman with polysubstance dependence. Currently heavily abusing over-the-counter dextromethorphan and antihistamines to the point of hallucinations and delirium. Mood is bad and irritable. Recent suicidal thoughts and a past history of suicidal behavior. Behavior out of control and unsafe. The patient requires hospitalization for stability of her mood and re-evaluation of the need for treatment of mood with medication, substance abuse orientation and re-evaluation of need for substance abuse treatment and education.   TREATMENT PLAN: The patient will be admitted to the hospital. I will give her an order for trazodone as needed for sleep and Seroquel as needed for anxiety. I want to avoid both benzodiazepines and Atarax and she has abused both benzodiazepines and antihistamines as an outpatient. She is telling me that she has not been abusing any pain medicine, but her drug screen is  positive for opiates. There is a possibility that she may have some narcotic withdrawal. We will monitor vital signs and watch for that. Right now, the rest of her labs are not remarkably abnormal. We do not need recheck any labs immediately. She will be engaged in substance abuse groups on the unit. We will try to get the family involved. Substance abuse counselors will work with her. We will re-evaluate whether she needs specific antidepressants.   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   SECONDARY DIAGNOSES:  AXIS I: Polysubstance dependence.   AXIS II: Deferred.   AXIS III: Status post acute antihistamine intoxication.   AXIS IV: Severe stress from her ongoing substance abuse and failure to function at home.    AXIS V: Functioning at time of evaluation 30.   ____________________________ Audery AmelJohn T. Toya Palacios, MD jtc:ap D: 01/22/2012 10:26:07 ET T: 01/22/2012 10:38:34 ET JOB#: [id number]  cc: Audery AmelJohn T. Anisia Leija, MD, <Dictator> Audery AmelJOHN T Song Garris MD ELECTRONICALLY SIGNED 01/22/2012 14:57

## 2015-03-31 NOTE — Op Note (Signed)
PATIENT NAME:  [name], [name] MR#:  [id number] DATE OF BIRTH:  1986/01/16  DATE OF PROCEDURE:  04/20/2012  PREOPERATIVE DIAGNOSIS: Delayed malunion bimalleolar ankle fracture.   POSTOPERATIVE DIAGNOSIS: Delayed malunion bimalleolar ankle fracture.   PROCEDURE PERFORMED: Open reduction internal fixation left bimalleolar ankle fracture with delayed malunion.   SURGEON: Valinda HoarHoward E. Bandon Sherwin, MD   ANESTHESIA: General LMA.   COMPLICATIONS: None.   DRAINS: None.   DESCRIPTION OF PROCEDURE: The patient was brought to the Operating Room where she underwent satisfactory general LMA anesthesia in the supine position after refusing [name] spinal anesthetic. The left leg was prepped and draped in sterile fashion. An Esmarch was applied. The tourniquet was inflated to 350 mmHg. Tourniquet time was 73 minutes. [name]  longitudinal lateral incision was made over the distal fibula and dissection carried out sharply through subcutaneous tissue and down to the thickened periosteum. The fracture site was identified and gradually opened with [name] periosteal elevator. I was able to push the distal portion of the fibula medially and applied [name] six-hole locking plate. Fluoroscopy showed that the distal fibula had moved over medially into good position and improved the ankle mortise appearance. The six-hole plate was bent to contour with the shaft of the distal fibula and was affixed with five cortical and one distal locking screw. Fluoroscopy showed that the correction of the fibula was quite good. [name] medial incision was then made longitudinally over the medial malleolus. Dissection again was carried out sharply through subcutaneous tissue revealing [name] thickened periosteum. The fracture site was identified and reopened. The joint was opened and scar tissue removed. The medial malleolar fragment was freed up gently with [name] periosteal elevator, and I was able to move it medially somewhat. This was pinned with two K-wires, and fluoroscopy  showed those to be in good position and the malleolus to be improved in position. The 48 and 50 mm cannulated 4.0 screws were inserted and cinched the malleolus down nicely. The pins were removed. Fluoroscopy showed that the hardware was in good position and the medial and lateral malleolar were improved in position. The ankle mortise was anatomic again. The wounds were irrigated and closed with 3-0 Vicryl and staples, and 0.5% Marcaine was placed in the wounds. [name] dry sterile compression dressing and posterior splint was applied. The tourniquet was deflated with good return of blood flow to the foot. The patient was awakened and taken to recovery in good condition.   ____________________________ Valinda HoarHoward E. Vue Pavon, MD hem:cbb D: 04/20/2012 15:12:52 ET T: 04/20/2012 16:04:17 ET JOB#: [id number]  cc: Valinda HoarHoward E. Benny Henrie, MD, <Dictator> Valinda HoarHOWARD E Jann Milkovich MD ELECTRONICALLY SIGNED 04/21/2012 11:17

## 2015-03-31 NOTE — Discharge Summary (Signed)
PATIENT NAME:  [name], Ariana A MR#:  [id number] DATE OF BIRTH:  1986/09/11  DATE OF ADMISSION:  01/22/2012 DATE OF DISCHARGE:  01/27/2012  HOSPITAL COURSE: The patient was admitted through the Emergency Room intoxicated with over-the-counter medication and opiates. Voicing some suicidal ideation. Almost delirious. Out of control behavior at home. In the hospital, she was detoxed and monitored. Initially she was sedated but had improved substantially over the last couple of days. She has shown increasing insight and has participated in groups. She is able to articulate an understanding about how her addiction has caused her severe problems with her family and mood. As far as mood medication, she was continued on gabapentin, trazodone, and Seroquel, which have been helpful in the past. These have helped her to sleep and help control her irritability and mood during the day. She is agreeable now to a plan to go to outpatient treatment through [facility]. She had a family meeting with her husband and it is agreed that she will be able to go back home. At no time has she engaged in any suicidal or aggressive behavior or shown psychotic symptoms on the unit.   LABS/STUDIES: On admission drug screen was positive for opiates, phencyclidine, and tricyclics. TSH normal. Alcohol negative. Total protein a little elevated at 8.7. CBC showed a slightly elevated white count at 11.1. Pregnancy test was negative.   EKG: Sinus tachycardia.   DISCHARGE MEDICATIONS:  1. Quetiapine 200 mg at bedtime. 2. Trazodone 200 mg at bedtime p.r.n. 3. Paxil 20 mg per day.  4. Quetiapine 25 mg every 8 hours p.r.n. for anxiety. 5. Gabapentin 600 mg twice a day.   DISCHARGE MENTAL STATUS EXAMINATION: Casually dressed, neatly groomed woman. Cooperative. Good eye contact, normal psychomotor activity. Speech normal rate, tone, and volume. Affect euthymic, reactive, and appropriate. Mood stated as being good. Thoughts are lucid and directed,  no evidence of loosening of associations. Denies suicidal or homicidal ideation. Shows improved judgment and insight.    DIAGNOSIS PRINCIPLE AND PRIMARY:   AXIS I: Mood disorder, not otherwise specified.   SECONDARY DIAGNOSES:   AXIS I: Polysubstance dependence.   AXIS II: Deferred.   AXIS III: Deferred.   AXIS IV: Severe - Stress from disruption in her family.   AXIS V: Functioning at time of discharge 55. ____________________________ Audery AmelJohn T. Arnulfo Batson, MD jtc:slb D: 01/27/2012 12:11:36 ET T: 01/27/2012 12:17:57 ET JOB#: [id number]  cc: Audery AmelJohn T. Sua Spadafora, MD, <Dictator> Audery AmelJOHN T Martina Brodbeck MD ELECTRONICALLY SIGNED 01/28/2012 15:18

## 2015-04-16 NOTE — H&P (Signed)
Psychiatric Admission Assessment Adult Patient Identification:  29 year old female Date of Evaluation:  05/26/2013 Chief Complaint:  "depressed because my dad tried to kill himself." History of Present Illness (8 essential elements): Patient is a 29 year old caucasian married female who has a long history of impulsive behavior, overdoses and diagnosis of borderline personality disorder. Patient reports she is here for depression because her father tried to commit suicide a few days ago by shooting himself. Patient reports not being on any medication for the depression, not seen a psychiatrist in a long time. She was UDS positive for benzodiazepines, opiates, cocaine, phencyclidine. she denies abuse of these drugs. states she takes Ativan 1mg  po qid for anxiety and Percocet 10mg  every 6 hours foor pain. sheis denying withdrawal symptoms and states she can get off these meds anytime.consult note, presented after she harmed herself and presented with self-inflicted injuries to her legs after she was involved in an argument with her husband in which he stated that he does not love her anymore. The patient was [facility] for [facility]. The patient was not started on any medications. She reported that she has history of impulsive behavior, and she gets upset and is unable to control herself. 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 The patient was noted to have 3 or 4 self-inflicted injuries on both of her legs. She reported that she has severe depression but denied having any suicidal ideations or plans. PAST PSYCHIATRIC HISTORY: The patient has been hospitalized at [facility] multiple times in the past and has a history of multiple overdoses. She has been diagnosed with bipolar disorder, borderline personality disorder and is not following with any outpatient psychiatrist. She has been tried on multiple psychotropic medications  including Celexa, Cymbalta, Seroquel and Paxil. She remains noncompliant with her outpatient appointments and treatments.   Associated Signs/SymptomsSymptoms:  Depressed mood, agitated (Hypo) Manic Symptoms:  denies, h/o impulsive behaviors Anxiety Symptoms: anxious Psychotic Symptoms:  denies PTSD Symptoms:  Denies  Psychiatric Specialty Exam: Physical Exam:  Completed in ED, reviewed, stable  ROS:   CONSTITUTIONAL: No weight loss, fever, chills, weakness or fatigue. HEENT: Eyes: No diplopia or blurred vision. ENT: No earache, sore throat or runny nose.  CARDIOVASCULAR: No pressure, squeezing, strangling, tightness, heaviness or aching about the chest, neck, axilla or epigastrium.  RESPIRATORY: No cough, shortness of breath, dyspnea or orthopnea.  GASTROINTESTINAL: No nausea, vomiting or diarrhea.  GENITOURINARY: No dysuria, frequency or urgency.  MUSCULOSKELETAL: No muscle or joint pain, stiffness.  SKIN: No change in skin, hair or nails.  NEURO: No paresthesias, fasciculations, seizures or weakness.  PSYCHIATRIC:   depressed, polysubstance abuse ENDOCRINE: No heat or cold intolerance, polyuria or polydipsia.  HEMATOLOGICAL: No easy bruising or bleeding.   Vital Signs:  Blood pressure 160/97, pulse 105, temperature 99.3 degrees F oral, respiratory rate 18  General Appearance:  Neat  Eye Contact:  Fair  Speech:  Normal rate  Volume:  Normal  Mood:  Agitated  Affect:  Flat  Thought Process:  circumstantial  Orientation:  Alert and oriented x 3  Thought Content: non Delusional  Suicidal Thoughts: Denies  Homicidal Thoughts:  Denies  Memory:  fair  Judgment:  Impaired  Insight:  Lacking  Psychomotor Activity:  Increased slightly  Concentration:  fair  Recall:  fair, though evasive  Akathisia:  None  Handed:  Right  AIMS (if indicated):  Assets:  Resilience, physical health, social support  Sleep:  0 hours   Past Psychiatric History: Diagnosis:  Bipolar disorder,  Polysubstance abuse  Hospitalizations:  multiple  Outpatient Care:  [facility]  Substance Abuse Care:  None  Self-Mutilation:  None  Suicidal Attempts:  multiple  Violent Behaviors:  Denies   Past Medical History:  None (denies)  Past Medical (concussions, head injury, cardiac issues with apnea episode):  None  Allergies:  None  PTA Medications:  None  Previous Psychotropic Medications:  zoloft, made her swell up  Medication/Dose:  None           Substance Abuse History in the last 12 months: ativan, opiates, cocaine Consequences of Substance Abuse:  conflict with family  Social History:  Lives with husband and 3 yo son. she has high school education and currently has a job.  Additional Social History: Marital Status:  married Education:  high school History of Abuse (Emotional/Physical/Sexual):  Denies Hotel managerMilitary History:  None  Family History:  signifuicant for depression and drug abuse, father recently attempted suicide  No results found for this or any previous visit (from the past 72 hours).Evaluations Assessment:I:  Bipolar disorder, current episode depressed, Polysubstance dependenceII:  DeferredIII:  Denies AXIS IV: conflict with husband, fathers suicidea ttempt AXIS V:  35  Treatment Plan/Recommendations:  Review of chart, vital signs, medications, and notes. 1. Admit for crisis management and stabilization; estimated length of stay 5-7 days.Individual and group therapy encouraged.Medication management for current  agitation to reduce current     symptoms to baseline and improve patient's overall level of functioning. starting on detox protocol for Ativan and bupropion for depression.Patient agrreable,. 4. Coping skills for agitation developing.Continue crisis stabilization and management.Address health issues:  Monitoring vital signs, elevated due to agitation upon arriving     to the unit; will continue to monitor closely.Treatment plan in progress to prevent  relapse of depression and anxiety.Psychosocial education regarding relapse prevention and self-care.Health care follow-up for any health concerns that may arise.Call for consult with hospitalist for addtional specialty patient services as needed.  Current Medications: Seroquel 100mg  po qhs, Librium 25mg  po q 6 hours prn for anxiety and agitation  Observation Level/Precautions:  Every 15 minute checks  Laboratory:  Completed and reviewed, stable  Psychotherapy:  Individual and group therapy  Medications:  Management  Consultations:  None  Discharge Concerns:  safety, stabilization  Estimated LOS:  5-7 days  Other:  certify that inpatient services furnished can reasonably be expected to improve the patient's condition.  Hima Mukesh Kornegay,MD   Electronic Signatures: Patrick Northavi, Melisha Eggleton (MD)  (Signed on 20-Jun-14 11:58)  Authored  Last Updated: 20-Jun-14 11:58 by Patrick Northavi, Shanquita Ronning (MD)
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
## 2015-07-23 DIAGNOSIS — T8484XA Pain due to internal orthopedic prosthetic devices, implants and grafts, initial encounter: Secondary | ICD-10-CM | POA: Insufficient documentation

## 2016-09-22 ENCOUNTER — Inpatient Hospital Stay
Admission: EM | Admit: 2016-09-22 | Discharge: 2016-09-28 | DRG: 419 | Disposition: A | Discharge status: Home / Self Care | Payer: Medicaid Other | Attending: Surgery | Admitting: Surgery

## 2016-09-22 ENCOUNTER — Emergency Department: Payer: Medicaid Other

## 2016-09-22 ENCOUNTER — Encounter: Payer: Self-pay | Admitting: Emergency Medicine

## 2016-09-22 DIAGNOSIS — K8000 Calculus of gallbladder with acute cholecystitis without obstruction: Secondary | ICD-10-CM | POA: Diagnosis present

## 2016-09-22 DIAGNOSIS — R1011 Right upper quadrant pain: Secondary | ICD-10-CM | POA: Diagnosis present

## 2016-09-22 DIAGNOSIS — F17210 Nicotine dependence, cigarettes, uncomplicated: Secondary | ICD-10-CM | POA: Diagnosis present

## 2016-09-22 DIAGNOSIS — K810 Acute cholecystitis: Secondary | ICD-10-CM | POA: Diagnosis not present

## 2016-09-22 DIAGNOSIS — K8020 Calculus of gallbladder without cholecystitis without obstruction: Secondary | ICD-10-CM | POA: Diagnosis not present

## 2016-09-22 DIAGNOSIS — Z881 Allergy status to other antibiotic agents status: Secondary | ICD-10-CM | POA: Diagnosis not present

## 2016-09-22 DIAGNOSIS — F1410 Cocaine abuse, uncomplicated: Secondary | ICD-10-CM | POA: Diagnosis present

## 2016-09-22 HISTORY — DX: Headache: R51

## 2016-09-22 HISTORY — DX: Headache, unspecified: R51.9

## 2016-09-22 HISTORY — DX: Opioid abuse, uncomplicated: F11.10

## 2016-09-22 LAB — COMPREHENSIVE METABOLIC PANEL
ALK PHOS: 71 U/L (ref 38–126)
ALT: 23 U/L (ref 14–54)
AST: 20 U/L (ref 15–41)
Albumin: 4.2 g/dL (ref 3.5–5.0)
Anion gap: 9 (ref 5–15)
BILIRUBIN TOTAL: 0.4 mg/dL (ref 0.3–1.2)
BUN: 9 mg/dL (ref 6–20)
CALCIUM: 9.3 mg/dL (ref 8.9–10.3)
CHLORIDE: 104 mmol/L (ref 101–111)
CO2: 28 mmol/L (ref 22–32)
CREATININE: 0.89 mg/dL (ref 0.44–1.00)
GFR calc Af Amer: >60 mL/min (ref >60)
Glucose, Bld: 113 mg/dL — ABNORMAL HIGH (ref 65–99)
Potassium: 3.7 mmol/L (ref 3.5–5.1)
Sodium: 141 mmol/L (ref 135–145)
Total Protein: 7.1 g/dL (ref 6.5–8.1)

## 2016-09-22 LAB — URINALYSIS COMPLETE WITH MICROSCOPIC (ARMC ONLY)
BILIRUBIN URINE: NEGATIVE
Bacteria, UA: NONE SEEN
GLUCOSE, UA: NEGATIVE mg/dL
HGB URINE DIPSTICK: NEGATIVE
Ketones, ur: NEGATIVE mg/dL
LEUKOCYTES UA: NEGATIVE
Nitrite: NEGATIVE
Protein, ur: NEGATIVE mg/dL
Specific Gravity, Urine: 1.017 (ref 1.005–1.030)
pH: 6 (ref 5.0–8.0)

## 2016-09-22 LAB — CBC
HCT: 42.7 % (ref 35.0–47.0)
Hemoglobin: 14.3 g/dL (ref 12.0–16.0)
MCH: 28.3 pg (ref 26.0–34.0)
MCHC: 33.4 g/dL (ref 32.0–36.0)
MCV: 84.8 fL (ref 80.0–100.0)
PLATELETS: 266 10*3/uL (ref 150–440)
RBC: 5.03 MIL/uL (ref 3.80–5.20)
RDW: 15.1 % — AB (ref 11.5–14.5)
WBC: 13.1 10*3/uL — AB (ref 3.6–11.0)

## 2016-09-22 LAB — POCT PREGNANCY, URINE: Preg Test, Ur: NEGATIVE

## 2016-09-22 LAB — LIPASE, BLOOD: LIPASE: 24 U/L (ref 11–51)

## 2016-09-22 MED ORDER — FEXOFENADINE HCL 180 MG PO TABS
180.0000 mg | ORAL_TABLET | Freq: Every day | ORAL | Status: DC | PRN
  Filled 2016-09-22: qty 1

## 2016-09-22 MED ORDER — CIPROFLOXACIN IN D5W 400 MG/200ML IV SOLN
400.0000 mg | Freq: Two times a day (BID) | INTRAVENOUS | Status: DC
  Administered 2016-09-22 – 2016-09-27 (×11): 400 mg via INTRAVENOUS
  Filled 2016-09-22 (×12): qty 200

## 2016-09-22 MED ORDER — HYDROMORPHONE HCL 1 MG/ML IJ SOLN
1.0000 mg | INTRAMUSCULAR | Status: DC | PRN
  Administered 2016-09-22 – 2016-09-28 (×39): 1 mg via INTRAVENOUS
  Filled 2016-09-22 (×39): qty 1

## 2016-09-22 MED ORDER — TOPIRAMATE 25 MG PO TABS
50.0000 mg | ORAL_TABLET | Freq: Every day | ORAL | Status: DC
  Administered 2016-09-24 – 2016-09-28 (×4): 50 mg via ORAL
  Filled 2016-09-22 (×4): qty 2

## 2016-09-22 MED ORDER — BUPRENORPHINE HCL 8 MG SL SUBL
8.0000 mg | SUBLINGUAL_TABLET | Freq: Three times a day (TID) | SUBLINGUAL | Status: DC
  Administered 2016-09-22 – 2016-09-24 (×4): 8 mg via SUBLINGUAL
  Filled 2016-09-22 (×4): qty 1

## 2016-09-22 MED ORDER — BUPRENORPHINE HCL-NALOXONE HCL 5.7-1.4 MG SL SUBL: 1.0000 | SUBLINGUAL_TABLET | Freq: Three times a day (TID) | SUBLINGUAL | Status: DC

## 2016-09-22 MED ORDER — TRAZODONE HCL 50 MG PO TABS
150.0000 mg | ORAL_TABLET | Freq: Every day | ORAL | Status: DC
  Administered 2016-09-22 – 2016-09-27 (×5): 150 mg via ORAL
  Filled 2016-09-22 (×6): qty 1

## 2016-09-22 MED ORDER — SODIUM CHLORIDE 0.9 % IV BOLUS (SEPSIS)
1000.0000 mL | Freq: Once | INTRAVENOUS | Status: AC
  Administered 2016-09-22: 1000 mL via INTRAVENOUS

## 2016-09-22 MED ORDER — MORPHINE SULFATE (PF) 4 MG/ML IV SOLN: 4.0000 mg | Freq: Once | INTRAVENOUS | Status: DC

## 2016-09-22 MED ORDER — MORPHINE SULFATE (PF) 2 MG/ML IV SOLN
INTRAVENOUS | Status: AC
  Administered 2016-09-22: 4 mg via INTRAVENOUS
  Filled 2016-09-22: qty 2

## 2016-09-22 MED ORDER — MORPHINE SULFATE (PF) 4 MG/ML IV SOLN
4.0000 mg | Freq: Once | INTRAVENOUS | Status: DC
  Filled 2016-09-22: qty 1

## 2016-09-22 MED ORDER — ONDANSETRON HCL 4 MG/2ML IJ SOLN
4.0000 mg | Freq: Once | INTRAMUSCULAR | Status: AC
  Administered 2016-09-22: 4 mg via INTRAVENOUS
  Filled 2016-09-22: qty 2

## 2016-09-22 MED ORDER — HYDROXYZINE HCL 25 MG PO TABS
50.0000 mg | ORAL_TABLET | Freq: Every day | ORAL | Status: DC
  Administered 2016-09-22 – 2016-09-27 (×5): 50 mg via ORAL
  Filled 2016-09-22 (×6): qty 2

## 2016-09-22 MED ORDER — FAMOTIDINE 20 MG PO TABS
20.0000 mg | ORAL_TABLET | Freq: Every day | ORAL | Status: DC
  Administered 2016-09-22 – 2016-09-28 (×5): 20 mg via ORAL
  Filled 2016-09-22 (×5): qty 1

## 2016-09-22 MED ORDER — ONDANSETRON HCL 4 MG PO TABS
4.0000 mg | ORAL_TABLET | Freq: Four times a day (QID) | ORAL | Status: DC | PRN
  Administered 2016-09-25: 4 mg via ORAL
  Filled 2016-09-22: qty 1

## 2016-09-22 MED ORDER — HEPARIN SODIUM (PORCINE) 5000 UNIT/ML IJ SOLN
5000.0000 [IU] | Freq: Three times a day (TID) | INTRAMUSCULAR | Status: DC
  Administered 2016-09-22 – 2016-09-28 (×16): 5000 [IU] via SUBCUTANEOUS
  Filled 2016-09-22 (×16): qty 1

## 2016-09-22 MED ORDER — ONDANSETRON HCL 4 MG/2ML IJ SOLN
4.0000 mg | Freq: Four times a day (QID) | INTRAMUSCULAR | Status: DC | PRN
  Administered 2016-09-27: 4 mg via INTRAVENOUS
  Filled 2016-09-22: qty 2

## 2016-09-22 MED ORDER — QUETIAPINE FUMARATE 25 MG PO TABS
50.0000 mg | ORAL_TABLET | Freq: Every day | ORAL | Status: DC
  Administered 2016-09-22 – 2016-09-27 (×5): 50 mg via ORAL
  Filled 2016-09-22 (×6): qty 2

## 2016-09-22 MED ORDER — LACTATED RINGERS IV SOLN
INTRAVENOUS | Status: DC
  Administered 2016-09-22 – 2016-09-28 (×15): via INTRAVENOUS

## 2016-09-22 NOTE — ED Triage Notes (Signed)
Pt to ed with c/o right upper quad abd pain and nausea, denies vomiting, denies diarrhea.

## 2016-09-22 NOTE — H&P (Signed)
[name] is an 30 y.o. female.    Chief Complaint: Right upper quadrant pain  HPI: This a patient writhing in pain with 2 hours of abdominal pain. She's never had an episode like this before stating that it is 10 out of 10 and points to the right upper quadrant. She states it rose to the back as well she's had nausea vomiting but no fevers or chills and no jaundice or acholic stools she's not had a bowel movement today.  She may have mesh in place of a umbilical hernia repaired at some point.  Past Medical History:  Diagnosis Date  . Heroin abuse     Past Surgical History:  Procedure Laterality Date  . ANKLE SURGERY Left 2012  . CESAREAN SECTION  2011  . DILATION AND CURETTAGE OF UTERUS  2013  . TONSILLECTOMY  2007    Family History  Problem Relation Age of Onset  . Cancer Mother     skin   Social History:  reports that she has been smoking Cigarettes.  She has a 13.00 pack-year smoking history. She has never used smokeless tobacco. She reports that she does not drink alcohol or use drugs. 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 Clindamycin/Lincomycin Anaphylaxis and Rash  . Amoxicillin Rash     (Not in a hospital admission)   Review of Systems  Constitutional: Negative for chills and fever.  HENT: Negative.   Eyes: Negative.   Respiratory: Negative.   Cardiovascular: Negative.   Gastrointestinal: Positive for abdominal pain, nausea and vomiting. Negative for blood in stool, constipation, diarrhea and heartburn.  Genitourinary: Negative.   Musculoskeletal: Negative.   Skin: Negative.   Neurological: Negative.   Endo/Heme/Allergies: Negative.   Psychiatric/Behavioral: Negative.      Physical Exam:  BP (!) 143/97 (BP Location: Right Arm)   Pulse 65   Temp 98.1 F (36.7 C)   Resp 18   Wt 227 lb (103 kg)   LMP 09/21/2016   SpO2 99%    BMI 38.96 kg/m   Physical Exam  Constitutional: She is oriented to person, place, and time. She appears distressed.  Writhing in pain and crying  HENT:  Head: Normocephalic and atraumatic.  Eyes: Pupils are equal, round, and reactive to light. Right eye exhibits no discharge. Left eye exhibits no discharge. No scleral icterus.  Neck: Normal range of motion.  Cardiovascular: Normal rate, regular rhythm and normal heart sounds.   Pulmonary/Chest: Effort normal and breath sounds normal. No respiratory distress. She has no wheezes. She has no rales.  Abdominal: Soft. She exhibits no distension. There is tenderness. There is no rebound and no guarding.  Positive Murphy sign  Musculoskeletal: Normal range of motion. She exhibits no edema or tenderness.  Lymphadenopathy:    She has no cervical adenopathy.  Neurological: She is alert and oriented to person, place, and time.  Skin: Skin is warm and dry. She is not diaphoretic. No erythema.  Multiple tattoos  Psychiatric: Mood and affect normal.  Vitals reviewed.       Results for orders placed or performed during the hospital encounter of 09/22/16 (from the past 48 hour(s))  Lipase, blood     Status: None   Collection Time: 09/22/16 11:16 AM  Result Value Ref Range   Lipase 24 11 - 51 U/L  Comprehensive metabolic panel  Status: Abnormal   Collection Time: 09/22/16 11:16 AM  Result Value Ref Range   Sodium 141 135 - 145 mmol/L   Potassium 3.7 3.5 - 5.1 mmol/L   Chloride 104 101 - 111 mmol/L   CO2 28 22 - 32 mmol/L   Glucose, Bld 113 (H) 65 - 99 mg/dL   BUN 9 6 - 20 mg/dL   Creatinine, Ser 0.89 0.44 - 1.00 mg/dL   Calcium 9.3 8.9 - 10.3 mg/dL   Total Protein 7.1 6.5 - 8.1 g/dL   Albumin 4.2 3.5 - 5.0 g/dL   AST 20 15 - 41 U/L   ALT 23 14 - 54 U/L   Alkaline Phosphatase 71 38 - 126 U/L   Total Bilirubin 0.4 0.3 - 1.2 mg/dL   GFR calc non Af Amer >60 >60 mL/min   GFR calc Af Amer >60 >60 mL/min    Comment: (NOTE) The eGFR  has been calculated using the CKD EPI equation. This calculation has not been validated in all clinical situations. eGFR's persistently <60 mL/min signify possible Chronic Kidney Disease.    Anion gap 9 5 - 15  CBC     Status: Abnormal   Collection Time: 09/22/16 11:16 AM  Result Value Ref Range   WBC 13.1 (H) 3.6 - 11.0 K/uL   RBC 5.03 3.80 - 5.20 MIL/uL   Hemoglobin 14.3 12.0 - 16.0 g/dL   HCT 42.7 35.0 - 47.0 %   MCV 84.8 80.0 - 100.0 fL   MCH 28.3 26.0 - 34.0 pg   MCHC 33.4 32.0 - 36.0 g/dL   RDW 15.1 (H) 11.5 - 14.5 %   Platelets 266 150 - 440 K/uL  Urinalysis complete, with microscopic     Status: Abnormal   Collection Time: 09/22/16  2:46 PM  Result Value Ref Range   Color, Urine YELLOW (A) YELLOW   APPearance CLEAR (A) CLEAR   Glucose, UA NEGATIVE NEGATIVE mg/dL   Bilirubin Urine NEGATIVE NEGATIVE   Ketones, ur NEGATIVE NEGATIVE mg/dL   Specific Gravity, Urine 1.017 1.005 - 1.030   Hgb urine dipstick NEGATIVE NEGATIVE   pH 6.0 5.0 - 8.0   Protein, ur NEGATIVE NEGATIVE mg/dL   Nitrite NEGATIVE NEGATIVE   Leukocytes, UA NEGATIVE NEGATIVE   RBC / HPF 0-5 0 - 5 RBC/hpf   WBC, UA 0-5 0 - 5 WBC/hpf   Bacteria, UA NONE SEEN NONE SEEN   Squamous Epithelial / LPF 0-5 (A) NONE SEEN   Mucous PRESENT   Pregnancy, urine POC     Status: None   Collection Time: 09/22/16  2:54 PM  Result Value Ref Range   Preg Test, Ur NEGATIVE NEGATIVE    Comment:        THE SENSITIVITY OF THIS METHODOLOGY IS >24 mIU/mL    US Abdomen Limited Ruq  Result Date: 09/22/2016 CLINICAL DATA:  Right upper quadrant pain for 1 day EXAM: US ABDOMEN LIMITED - RIGHT UPPER QUADRANT COMPARISON:  None. FINDINGS: Gallbladder: Multiple gallstones are noted without gallbladder wall thickening or pericholecystic fluid. Common bile duct: Diameter: 4 mm. Liver: No focal lesion identified. Within normal limits in parenchymal echogenicity. IMPRESSION: Multiple gallstones without complicating factors. Electronically  Signed   By: Inez Catalina M.D.   On: 09/22/2016 15:41     Assessment/Plan  This a patient with likely biliary colic or possibly acute cholecystitis. She has been ill for2 hours but her symptoms are quite significant and she's been vomiting.  I've recommended admission to the  hospital starting IV antibiotics and proceeding with laparoscopic cholecystectomy for control of her symptoms. I discussed with her the rationale for this and the options of observation and the risks of bleeding infection recurrence failure to resolve her symptoms conversion to an open procedure while duct damage or bowel injury she understood and agreed to proceed.  She has had an umbilical hernia repair with possible mesh placement as well.  Florene Glen, MD, FACS

## 2016-09-22 NOTE — ED Notes (Signed)
Pt unable to urinate at this time given specimen cup for when is able to void. Pt sent back out to lobby.

## 2016-09-22 NOTE — ED Triage Notes (Signed)
Pt with RUQ pain.

## 2016-09-22 NOTE — ED Provider Notes (Signed)
[facility] Emergency Department Provider Note   ____________________________________________   First MD Initiated Contact with Patient 09/22/16 1251     (approximate)  I have reviewed the triage vital signs and the nursing notes.   HISTORY  Chief Complaint Abdominal Pain   HPI [name] is a 30 y.o. female with a history of heroin abuse who is presenting with right upper quadrant abdominal pain. She says it is associated with nausea and vomiting. Denies any blood in her vomitus. Says that the pain started last and has been constant and is now 10 out of 10. She says it radiates through to her back as well.Denies any blood in her vomitus.   Past Medical History:  Diagnosis Date  . Heroin abuse     There are no active problems to display for this patient.   Past Surgical History:  Procedure Laterality Date  . ANKLE SURGERY Left 2012  . CESAREAN SECTION  2011  . DILATION AND CURETTAGE OF UTERUS  2013  . TONSILLECTOMY  2007    Prior to Admission medications   Medication Sig Start Date End Date Taking? Authorizing Provider  CELEBREX 200 MG capsule Take 1 capsule by mouth daily. 09/17/16  Yes Historical Provider, MD  hydrOXYzine (ATARAX/VISTARIL) 50 MG tablet Take 1 tablet by mouth at bedtime. 09/17/16  Yes Historical Provider, MD  QUEtiapine (SEROQUEL) 50 MG tablet Take 1 tablet by mouth at bedtime. 09/17/16  Yes Historical Provider, MD  topiramate (TOPAMAX) 50 MG tablet Take 1 tablet by mouth daily. 09/17/16  Yes Historical Provider, MD  traZODone (DESYREL) 150 MG tablet Take 1 tablet by mouth at bedtime. 09/17/16  Yes Historical Provider, MD  ZUBSOLV 5.7-1.4 MG SUBL Take 1 tablet by mouth 3 (three) times daily. 09/17/16  Yes Historical Provider, MD  Buprenorphine HCl-Naloxone HCl (SUBOXONE SL) Place 6 mg under the tongue daily.    Historical Provider, MD  Fexofenadine HCl (MUCINEX ALLERGY PO) Take by mouth as needed.    Historical  Provider, MD  ranitidine (ZANTAC) 150 MG tablet Take 150 mg by mouth daily.    Historical Provider, MD    Allergies Clindamycin/lincomycin and Amoxicillin  Family History  Problem Relation Age of Onset  . Cancer Mother     skin    Social History Social History  Substance Use Topics  . Smoking status: Current Every Day Smoker    Packs/day: 1.00    Years: 13.00    Types: Cigarettes  . Smokeless tobacco: Never Used  . Alcohol use No    Review of Systems Constitutional: No fever/chills Eyes: No visual changes. ENT: No sore throat. Cardiovascular: Denies chest pain. Respiratory: Denies shortness of breath. Gastrointestinal:  No diarrhea.  No constipation. Genitourinary: Negative for dysuria. Musculoskeletal: Negative for back pain. Skin: Negative for rash. Neurological: Negative for headaches, focal weakness or numbness.  10-point ROS otherwise negative.  ____________________________________________   PHYSICAL EXAM:  VITAL SIGNS: ED Triage Vitals  Enc Vitals Group     BP --      Pulse --      Resp --      Temp --      Temp src --      SpO2 --      Weight 09/22/16 1102 227 lb (103 kg)     Height --      Head Circumference --      Peak Flow --      Pain Score 09/22/16 1103 10  Pain Loc --      Pain Edu? --      Excl. in GC? --     Constitutional: Alert and oriented. Appears uncomfortable. Rolling around on the bed. Eyes: Conjunctivae are normal. PERRL. EOMI. Head: Atraumatic. Nose: No congestion/rhinnorhea. Mouth/Throat: Mucous membranes are moist.   Neck: No stridor.   Cardiovascular: Normal rate, regular rhythm. Grossly normal heart sounds.   Respiratory: Normal respiratory effort.  No retractions. Lungs CTAB. Gastrointestinal: Soft . No distention. Positive Murphy sign to the right upper quadrant. No tenderness in any other quadrant. Mild right-sided CVA tenderness to palpation. Musculoskeletal: No lower extremity tenderness nor edema.     Neurologic:  Normal speech and language. No gross focal neurologic deficits are appreciated.  Skin:  Skin is warm, dry and intact. No rash noted. Psychiatric: Mood and affect are normal. Speech and behavior are normal.  ____________________________________________   LABS (all labs ordered are listed, but only abnormal results are displayed)  Labs Reviewed  COMPREHENSIVE METABOLIC PANEL - Abnormal; Notable for the following:       Result Value   Glucose, Bld 113 (*)    All other components within normal limits  CBC - Abnormal; Notable for the following:    WBC 13.1 (*)    RDW 15.1 (*)    All other components within normal limits  LIPASE, BLOOD  URINALYSIS COMPLETEWITH MICROSCOPIC ([facility] ONLY)  POC URINE PREG, ED   ____________________________________________  EKG   ____________________________________________  RADIOLOGY  Pending ultrasound of the right upper quadrant at this time. ____________________________________________   PROCEDURES  Procedure(s) performed:   Procedures  Critical Care performed:   ____________________________________________   INITIAL IMPRESSION / ASSESSMENT AND PLAN / ED COURSE  Pertinent labs & imaging results that were available during my care of the patient were reviewed by me and considered in my medical decision making (see chart for details).  ----------------------------------------- 345 PM on 09/22/2016 ----------------------------------------- Pending ultrasound of the right upper quadrant. Signed out to Dr. Don Perking.    Clinical Course     ____________________________________________   FINAL CLINICAL IMPRESSION(S) / ED DIAGNOSES  Final diagnoses:  RUQ abdominal pain      NEW MEDICATIONS STARTED DURING THIS VISIT:  New Prescriptions   No medications on file     Note:  This document was prepared using Dragon voice recognition software and may include unintentional dictation errors.    Myrna Blazer, MD 09/22/16 [phone]

## 2016-09-23 ENCOUNTER — Inpatient Hospital Stay: Payer: Medicaid Other | Admitting: Certified Registered"

## 2016-09-23 ENCOUNTER — Encounter
Admission: EM | Disposition: A | Discharge status: Home / Self Care | Payer: Self-pay | Source: Home / Self Care | Attending: Surgery

## 2016-09-23 ENCOUNTER — Encounter: Payer: Self-pay | Admitting: *Deleted

## 2016-09-23 DIAGNOSIS — K8020 Calculus of gallbladder without cholecystitis without obstruction: Secondary | ICD-10-CM

## 2016-09-23 LAB — CBC
HCT: 40.1 % (ref 35.0–47.0)
Hemoglobin: 13.1 g/dL (ref 12.0–16.0)
MCH: 28 pg (ref 26.0–34.0)
MCHC: 32.7 g/dL (ref 32.0–36.0)
MCV: 85.6 fL (ref 80.0–100.0)
PLATELETS: 222 10*3/uL (ref 150–440)
RBC: 4.69 MIL/uL (ref 3.80–5.20)
RDW: 15.1 % — ABNORMAL HIGH (ref 11.5–14.5)
WBC: 13.3 10*3/uL — AB (ref 3.6–11.0)

## 2016-09-23 LAB — COMPREHENSIVE METABOLIC PANEL
ALK PHOS: 53 U/L (ref 38–126)
ALT: 19 U/L (ref 14–54)
AST: 16 U/L (ref 15–41)
Albumin: 3.2 g/dL — ABNORMAL LOW (ref 3.5–5.0)
Anion gap: 5 (ref 5–15)
BILIRUBIN TOTAL: 0.8 mg/dL (ref 0.3–1.2)
BUN: 5 mg/dL — ABNORMAL LOW (ref 6–20)
CALCIUM: 8.3 mg/dL — AB (ref 8.9–10.3)
CO2: 28 mmol/L (ref 22–32)
CREATININE: 0.75 mg/dL (ref 0.44–1.00)
Chloride: 107 mmol/L (ref 101–111)
Glucose, Bld: 115 mg/dL — ABNORMAL HIGH (ref 65–99)
Potassium: 3.9 mmol/L (ref 3.5–5.1)
Sodium: 140 mmol/L (ref 135–145)
TOTAL PROTEIN: 5.8 g/dL — AB (ref 6.5–8.1)

## 2016-09-23 LAB — URINE DRUG SCREEN, QUALITATIVE (ARMC ONLY)
Amphetamines, Ur Screen: NOT DETECTED
BARBITURATES, UR SCREEN: NOT DETECTED
BENZODIAZEPINE, UR SCRN: POSITIVE — AB
Cannabinoid 50 Ng, Ur ~~LOC~~: NOT DETECTED
Cocaine Metabolite,Ur ~~LOC~~: POSITIVE — AB
MDMA (Ecstasy)Ur Screen: NOT DETECTED
METHADONE SCREEN, URINE: NOT DETECTED
OPIATE, UR SCREEN: POSITIVE — AB
Phencyclidine (PCP) Ur S: NOT DETECTED
TRICYCLIC, UR SCREEN: NOT DETECTED

## 2016-09-23 LAB — SURGICAL PCR SCREEN
MRSA, PCR: NEGATIVE
Staphylococcus aureus: NEGATIVE

## 2016-09-23 SURGERY — LAPAROSCOPIC CHOLECYSTECTOMY WITH INTRAOPERATIVE CHOLANGIOGRAM
Anesthesia: Choice

## 2016-09-23 MED ORDER — ACETAMINOPHEN 10 MG/ML IV SOLN
INTRAVENOUS | Status: AC
  Filled 2016-09-23: qty 100

## 2016-09-23 MED ORDER — BUPIVACAINE-EPINEPHRINE (PF) 0.25% -1:200000 IJ SOLN
INTRAMUSCULAR | Status: AC
  Filled 2016-09-23: qty 30

## 2016-09-23 MED ORDER — BOOST / RESOURCE BREEZE PO LIQD
1.0000 | Freq: Three times a day (TID) | ORAL | Status: DC
  Administered 2016-09-26: 1 via ORAL

## 2016-09-23 SURGICAL SUPPLY — 42 items
ADHESIVE MASTISOL STRL (MISCELLANEOUS) ×2 IMPLANT
APPLIER CLIP ROT 10 11.4 M/L (STAPLE) ×2
BLADE SURG SZ11 CARB STEEL (BLADE) ×2 IMPLANT
CANISTER SUCT 1200ML W/VALVE (MISCELLANEOUS) ×2 IMPLANT
CATH CHOLANGI 4FR 420404F (CATHETERS) IMPLANT
CHLORAPREP W/TINT 26ML (MISCELLANEOUS) ×2 IMPLANT
CLIP APPLIE ROT 10 11.4 M/L (STAPLE) ×1 IMPLANT
CONRAY 60ML FOR OR (MISCELLANEOUS) IMPLANT
DRAPE C-ARM XRAY 36X54 (DRAPES) IMPLANT
ELECT REM PT RETURN 9FT ADLT (ELECTROSURGICAL) ×2
ELECTRODE REM PT RTRN 9FT ADLT (ELECTROSURGICAL) ×1 IMPLANT
ENDOPOUCH RETRIEVER 10 (MISCELLANEOUS) ×2 IMPLANT
GAUZE SPONGE NON-WVN 2X2 STRL (MISCELLANEOUS) ×4 IMPLANT
GLOVE BIO SURGEON STRL SZ8 (GLOVE) ×2 IMPLANT
GOWN STRL REUS W/ TWL LRG LVL3 (GOWN DISPOSABLE) ×4 IMPLANT
GOWN STRL REUS W/TWL LRG LVL3 (GOWN DISPOSABLE) ×4
IRRIGATION STRYKERFLOW (MISCELLANEOUS) IMPLANT
IRRIGATOR STRYKERFLOW (MISCELLANEOUS)
IV CATH ANGIO 12GX3 LT BLUE (NEEDLE) ×2 IMPLANT
IV NS 1000ML (IV SOLUTION)
IV NS 1000ML BAXH (IV SOLUTION) IMPLANT
JACKSON PRATT 10 (INSTRUMENTS) IMPLANT
KIT RM TURNOVER STRD PROC AR (KITS) ×2 IMPLANT
LABEL OR SOLS (LABEL) ×2 IMPLANT
NDL SAFETY 22GX1.5 (NEEDLE) ×2 IMPLANT
NEEDLE VERESS 14GA 120MM (NEEDLE) ×2 IMPLANT
NS IRRIG 500ML POUR BTL (IV SOLUTION) ×2 IMPLANT
PACK LAP CHOLECYSTECTOMY (MISCELLANEOUS) ×2 IMPLANT
SCISSORS METZENBAUM CVD 33 (INSTRUMENTS) ×2 IMPLANT
SLEEVE ENDOPATH XCEL 5M (ENDOMECHANICALS) ×4 IMPLANT
SPONGE EXCIL AMD DRAIN 4X4 6P (MISCELLANEOUS) IMPLANT
SPONGE LAP 18X18 5 PK (GAUZE/BANDAGES/DRESSINGS) ×2 IMPLANT
SPONGE VERSALON 2X2 STRL (MISCELLANEOUS) ×4
STRIP CLOSURE SKIN 1/2X4 (GAUZE/BANDAGES/DRESSINGS) ×2 IMPLANT
SUT MNCRL 4-0 (SUTURE) ×1
SUT MNCRL 4-0 27XMFL (SUTURE) ×1
SUT VICRYL 0 AB UR-6 (SUTURE) ×2 IMPLANT
SUTURE MNCRL 4-0 27XMF (SUTURE) ×1 IMPLANT
SYR 20CC LL (SYRINGE) ×2 IMPLANT
TROCAR XCEL NON-BLD 11X100MML (ENDOMECHANICALS) ×2 IMPLANT
TROCAR XCEL NON-BLD 5MMX100MML (ENDOMECHANICALS) ×2 IMPLANT
TUBING INSUFFLATOR HI FLOW (MISCELLANEOUS) ×2 IMPLANT

## 2016-09-23 NOTE — Progress Notes (Signed)
Per Dr. Excell Seltzerooper discontinue order for urine screen send out

## 2016-09-23 NOTE — Progress Notes (Signed)
Patient has a piercing in her left cheek, states it Will not come out unless surgically removed.  Will notify Anesthesia.

## 2016-09-23 NOTE — Progress Notes (Signed)
The patient's urine tox screen was positive for cocaine metabolites. Anesthesia did not feel that general anesthetic was appropriate in this patient. The risks were too high. Therefore I discussed with the patient the need for canceling her surgery today rechecking her tox screen tomorrow morning and scheduling once she is negative. She understood and agreed with this plan

## 2016-09-23 NOTE — Progress Notes (Signed)
Chaplain was making rounds when he visited the Pt. Chaplain asked Pt if he could talk to her, but Pt said who was partly sleep asked Ch to come back next time. Ch plans have a follow-up visit later.    09/23/16 1500  Clinical Encounter Type  Visited With Patient  Visit Type Initial  Referral From Nurse  Spiritual Encounters  Spiritual Needs Prayer

## 2016-09-23 NOTE — Progress Notes (Signed)
Dr. Noralyn Pickarroll notified of order for UDS, has history of Heroin abuse.

## 2016-09-23 NOTE — Progress Notes (Signed)
Notified Floor RN and Dr. Randa NgoPiscitello of history of Heroin abuse noted in chart.  Need UDS and ordered.

## 2016-09-23 NOTE — Progress Notes (Signed)
CC: RUQ pain Subjective: Is with right upper quadrant pain and a diagnosis of acute cholecystitis. She has ongoing pain and vomited again last night. She denies fevers or chills jaundice or acholic stools  Objective: Vital signs in last 24 hours: Temp:  [98.1 F (36.7 C)-98.5 F (36.9 C)] 98.5 F (36.9 C) (10/17 2020) Pulse Rate:  [53-65] 53 (10/17 2020) Resp:  [18-20] 18 (10/17 2020) BP: (143-157)/(77-99) 147/77 (10/17 2020) SpO2:  [97 %-100 %] 99 % (10/17 2020) Weight:  [207 lb 7 oz (94.1 kg)-227 lb (103 kg)] 207 lb 7 oz (94.1 kg) (10/17 1858)    Intake/Output from previous day: 10/17 0701 - 10/18 0700 In: 2132.6 [I.V.:1132.6; IV Piggyback:1000] Out: 800 [Urine:800] Intake/Output this shift: No intake/output data recorded.  Physical exam:  Vital signs are reviewed she is afebrile.  No icterus no jaundice.  Abdomen is soft but tender in the right upper quadrant with a positive Murphy sign extremities are without edema calves are nontender no jaundice.  Lab Results: CBC   Recent Labs  09/22/16 1116 09/23/16 0457  WBC 13.1* 13.3*  HGB 14.3 13.1  HCT 42.7 40.1  PLT 266 222   BMET  Recent Labs  09/22/16 1116 09/23/16 0457  NA 141 140  K 3.7 3.9  CL 104 107  CO2 28 28  GLUCOSE 113* 115*  BUN 9 5*  CREATININE 0.89 0.75  CALCIUM 9.3 8.3*   PT/INR No results for input(s): LABPROT, INR in the last 72 hours. ABG No results for input(s): PHART, HCO3 in the last 72 hours.  Invalid input(s): PCO2, PO2  Studies/Results: Koreas Abdomen Limited Ruq  Result Date: 09/22/2016 CLINICAL DATA:  Right upper quadrant pain for 1 day EXAM: US ABDOMEN LIMITED - RIGHT UPPER QUADRANT COMPARISON:  None. FINDINGS: Gallbladder: Multiple gallstones are noted without gallbladder wall thickening or pericholecystic fluid. Common bile duct: Diameter: 4 mm. Liver: No focal lesion identified. Within normal limits in parenchymal echogenicity. IMPRESSION: Multiple gallstones without  complicating factors. Electronically Signed   By: Alcide CleverMark  Lukens M.D.   On: 09/22/2016 15:41    Anti-infectives: Anti-infectives    Start     Dose/Rate Route Frequency Ordered Stop   09/22/16 2000  ciprofloxacin (CIPRO) IVPB 400 mg     400 mg 200 mL/hr over 60 Minutes Intravenous Every 12 hours 09/22/16 1713        Assessment/Plan:  Labs are personally reviewed showing no signs of choledocholithiasis.  My recommendations are to proceed with laparoscopic cholecystectomy today. I described for her the procedure itself and the options of observation as well as risks of bleeding infection recurrence of symptoms failure to resolve her symptoms and conversion to an open procedure to avoid damaging bile ducts or bowel she understood and agreed to proceed she had no questions this morning she will be scheduled for later today.  Lattie Hawichard E Cooper, MD, FACS  09/23/2016

## 2016-09-23 NOTE — Progress Notes (Signed)
Initial Nutrition Assessment  DOCUMENTATION CODES:   Obesity unspecified  INTERVENTION:  Boost Breeze po TID, each supplement provides 250 kcal and 9 grams of protein.  Diet advancement per surgery after cholecystectomy.  RD will continue to monitor and assess for more appropriate intervention (snacks vs oral nutrition supplement) when diet advanced.   NUTRITION DIAGNOSIS:   Unintentional weight loss related to social / environmental circumstances, poor appetite as evidenced by 14 percent weight loss over 2 months, per patient/family report.  GOAL:   Patient will meet greater than or equal to 90% of their needs  MONITOR:   PO intake, Supplement acceptance, Diet advancement, Labs, Weight trends, I & O's  REASON FOR ASSESSMENT:   Malnutrition Screening Tool    ASSESSMENT:   30 year old female presenting with right upper quadrant pain, found to have acute cholecystitis. Plan is for laparoscopic cholecystectomy.   Initially, patient reported her appetite was good and that she had been eating fine at home PTA. Upon further probing, revealed that it had been poor and she had only been able to eat 2 meals per day for a while. Patient was unable to provide any more details on intake. Patient is unsure of UBW, but believes she has lost 40 lbs in 2 months due to stress and poor appetite. Per chart, able to determine that patient did weigh 241 lbs [facility]23/2016 [facility]. This is a weight loss of 34 lbs (14% body weight) over 2 months, which is significant for time frame.  Medications reviewed and include: famotidine, LR @ 125 ml/hr, zofran prn.   Labs reviewed: BUN 5, Glucose 115.  Nutrition-Focused physical exam completed. Findings are no fat depletion, no muscle depletion, and no edema.   Patient does likely does have severe acute malnutrition in setting of significant weight loss, but unable to diagnose in absence of more information on intake  PTA and no depletions found on NFPE.  Discussed plan with RN. Patient did not have surgery today because she was found positive for cocaine.   Diet Order:  Diet clear liquid Room service appropriate? Yes; Fluid consistency: Thin Diet NPO time specified  Skin:  Reviewed, no issues  Last BM:  09/22/2016  Height:   Ht Readings from Last 1 Encounters:  09/22/16 5\' 4"  (1.626 m)    Weight:   Wt Readings from Last 1 Encounters:  09/22/16 207 lb 7 oz (94.1 kg)    Ideal Body Weight:  54.54 kg  BMI:  Body mass index is 35.61 kg/m.  Estimated Nutritional Needs:   Kcal:  2000-2300 (MSJ x 1.2-1.4)  Protein:  115-130 grams (1.2-1.4 grams/kg)  Fluid:  2-2.3 L/day  EDUCATION NEEDS:   Education needs no appropriate at this time  Helane RimaLeanne Dakiya Puopolo, MS, RD, LDN Pager: [phone] After Hours Pager: [phone]

## 2016-09-24 LAB — URINE DRUG SCREEN, QUALITATIVE (ARMC ONLY)
Amphetamines, Ur Screen: NOT DETECTED
BARBITURATES, UR SCREEN: NOT DETECTED
BENZODIAZEPINE, UR SCRN: POSITIVE — AB
Cannabinoid 50 Ng, Ur ~~LOC~~: NOT DETECTED
Cocaine Metabolite,Ur ~~LOC~~: POSITIVE — AB
MDMA (Ecstasy)Ur Screen: NOT DETECTED
METHADONE SCREEN, URINE: NOT DETECTED
OPIATE, UR SCREEN: POSITIVE — AB
Phencyclidine (PCP) Ur S: NOT DETECTED
TRICYCLIC, UR SCREEN: NOT DETECTED

## 2016-09-24 NOTE — Progress Notes (Signed)
Pt's suboxone provider called to inform us that we, "Need to not give the pt diluadid and suboxone at the same time." MD paged to notify of this request. MD is in surgery at this time, awaiting a phone back at this time.

## 2016-09-24 NOTE — Progress Notes (Signed)
Telephone conversation with Dr. Cathey EndowBowen the patient's addiction specialist. She made some recommendations concerning medical management and medications. These changes were instituted at her suggestion.

## 2016-09-24 NOTE — Progress Notes (Signed)
CC: Right upper quadrant pain Subjective: This patient with acute cholecystitis with ongoing right upper quadrant pain no nausea vomiting no fevers or chills.  She was scheduled for surgery yesterday but her tox screen, urine, demonstrate cocaine metabolites and anesthesia canceled her case. Today she has given a urine sample but that result is not returned yet.  Objective: Vital signs in last 24 hours: Temp:  [97.6 F (36.4 C)-99.2 F (37.3 C)] 99.2 F (37.3 C) (10/19 0508) Pulse Rate:  [68-87] 87 (10/19 0508) Resp:  [16-18] 18 (10/19 0508) BP: (108-144)/(52-87) 108/52 (10/19 0508) SpO2:  [91 %-95 %] 91 % (10/19 0508) Last BM Date: 09/22/16  Intake/Output from previous day: 10/18 0701 - 10/19 0700 In: 2851 [P.O.:240; I.V.:2211; IV Piggyback:400] Out: 2200 [Urine:2200] Intake/Output this shift: Total I/O In: -  Out: 800 [Urine:800]  Physical exam:  Vital signs reviewed no acute distress abdomen is soft tender in the right upper quadrant with a positive Murphy sign no icterus no jaundice nontender calves  Lab Results: CBC   Recent Labs  09/22/16 1116 09/23/16 0457  WBC 13.1* 13.3*  HGB 14.3 13.1  HCT 42.7 40.1  PLT 266 222   BMET  Recent Labs  09/22/16 1116 09/23/16 0457  NA 141 140  K 3.7 3.9  CL 104 107  CO2 28 28  GLUCOSE 113* 115*  BUN 9 5*  CREATININE 0.89 0.75  CALCIUM 9.3 8.3*   PT/INR No results for input(s): LABPROT, INR in the last 72 hours. ABG No results for input(s): PHART, HCO3 in the last 72 hours.  Invalid input(s): PCO2, PO2  Studies/Results: Kore[place] Abdomen Limited Ruq  Result Date: 09/22/2016 CLINICAL DATA:  Right upper quadrant pain for 1 day EXAM: US ABDOMEN LIMITED - RIGHT UPPER QUADRANT COMPARISON:  None. FINDINGS: Gallbladder: Multiple gallstones are noted without gallbladder wall thickening or pericholecystic fluid. Common bile duct: Diameter: 4 mm. Liver: No focal lesion identified. Within normal limits in parenchymal  echogenicity. IMPRESSION: Multiple gallstones without complicating factors. Electronically Signed   By: Alcide CleverMark  Lukens M.D.   On: 09/22/2016 15:41    Anti-infectives: Anti-infectives    Start     Dose/Rate Route Frequency Ordered Stop   09/22/16 2000  ciprofloxacin (CIPRO) IVPB 400 mg     400 mg 200 mL/hr over 60 Minutes Intravenous Every 12 hours 09/22/16 1713        Assessment/Plan:  Labs reviewed again. Discussed with the patient the need for a negative urine tox screen that tox screen is currently pending at this point. If it is negative for cocaine metabolites and we'll proceed with surgery this afternoon if not then we would reschedule again for tomorrow when anesthesia considers it safe to do so. This was reviewed with her the options were reviewed as well as the risks of surgery in detail as previously discussed  Lattie Hawichard E Dre Gamino, MD, FACS  09/24/2016

## 2016-09-24 NOTE — Progress Notes (Signed)
Patient's urine tox screen returned with positivity for cocaine metabolites. She will be rechecked again tomorrow hives given her full liquid diet today with nothing by mouth after midnight in anticipation of surgery tomorrow.

## 2016-09-25 ENCOUNTER — Encounter: Payer: Self-pay | Admitting: Anesthesiology

## 2016-09-25 LAB — URINE DRUG SCREEN, QUALITATIVE (ARMC ONLY)
Amphetamines, Ur Screen: NOT DETECTED
Barbiturates, Ur Screen: NOT DETECTED
Benzodiazepine, Ur Scrn: POSITIVE — AB
CANNABINOID 50 NG, UR ~~LOC~~: NOT DETECTED
COCAINE METABOLITE, UR ~~LOC~~: POSITIVE — AB
MDMA (ECSTASY) UR SCREEN: NOT DETECTED
Methadone Scn, Ur: NOT DETECTED
Opiate, Ur Screen: POSITIVE — AB
Phencyclidine (PCP) Ur S: NOT DETECTED
TRICYCLIC, UR SCREEN: NOT DETECTED

## 2016-09-25 NOTE — Anesthesia Preprocedure Evaluation (Addendum)
Anesthesia Evaluation  Patient identified by MRN, date of birth, ID band Patient awake    Reviewed: Allergy & Precautions, NPO status , Patient's Chart, lab work & pertinent test results  Airway Mallampati: III       Dental   Pulmonary Current Smoker,    Pulmonary exam normal        Cardiovascular negative cardio ROS Normal cardiovascular exam     Neuro/Psych  Headaches,    GI/Hepatic (+)     substance abuse  cocaine use and IV drug use,   Endo/Other  negative endocrine ROS  Renal/GU negative Renal ROS  negative genitourinary   Musculoskeletal negative musculoskeletal ROS (+)   Abdominal Normal abdominal exam  (+)   Peds negative pediatric ROS (+)  Hematology negative hematology ROS (+)   Anesthesia Other Findings   Reproductive/Obstetrics                            Anesthesia Physical Anesthesia Plan  ASA: III  Anesthesia Plan: General   Post-op Pain Management:    Induction: Intravenous  Airway Management Planned: Oral ETT  Additional Equipment:   Intra-op Plan:   Post-operative Plan: Extubation in OR  Informed Consent: I have reviewed the patients History and Physical, chart, labs and discussed the procedure including the risks, benefits and alternatives for the proposed anesthesia with the patient or authorized representative who has indicated his/her understanding and acceptance.   Dental advisory given  Plan Discussed with: CRNA and Surgeon  Anesthesia Plan Comments:         Anesthesia Quick Evaluation

## 2016-09-25 NOTE — Progress Notes (Signed)
Patient is here with acute cholecystitis but continues to test positive for cocaine again today she was positive on urine drug screen. She describes ongoing right upper quadrant pain and wants to have her surgery.  Vital signs are stable she is afebrile No icterus no jaundice Abdomen is soft tender in the right upper quadrant On tender calves  Drug screen positive for cocaine again  We'll discuss with anesthesia. This has been 3 days in a row that she has been positive for days since she last used according to her. Could likely proceed tomorrow even if negative but only if anesthesia agrees with that plan. This is discussed with the patient and with nursing.

## 2016-09-26 LAB — COMPREHENSIVE METABOLIC PANEL
ALK PHOS: 49 U/L (ref 38–126)
ALT: 78 U/L — AB (ref 14–54)
AST: 45 U/L — ABNORMAL HIGH (ref 15–41)
Albumin: 2.8 g/dL — ABNORMAL LOW (ref 3.5–5.0)
Anion gap: 6 (ref 5–15)
CALCIUM: 8.3 mg/dL — AB (ref 8.9–10.3)
CO2: 23 mmol/L (ref 22–32)
CREATININE: 0.77 mg/dL (ref 0.44–1.00)
Chloride: 112 mmol/L — ABNORMAL HIGH (ref 101–111)
GFR calc non Af Amer: >60 mL/min (ref >60)
GLUCOSE: 83 mg/dL (ref 65–99)
Potassium: 4 mmol/L (ref 3.5–5.1)
SODIUM: 141 mmol/L (ref 135–145)
Total Bilirubin: 0.9 mg/dL (ref 0.3–1.2)
Total Protein: 5.4 g/dL — ABNORMAL LOW (ref 6.5–8.1)

## 2016-09-26 LAB — URINE DRUG SCREEN, QUALITATIVE (ARMC ONLY)
AMPHETAMINES, UR SCREEN: NOT DETECTED
Barbiturates, Ur Screen: NOT DETECTED
Benzodiazepine, Ur Scrn: POSITIVE — AB
Cannabinoid 50 Ng, Ur ~~LOC~~: NOT DETECTED
Cocaine Metabolite,Ur ~~LOC~~: POSITIVE — AB
MDMA (ECSTASY) UR SCREEN: NOT DETECTED
Methadone Scn, Ur: NOT DETECTED
OPIATE, UR SCREEN: POSITIVE — AB
PHENCYCLIDINE (PCP) UR S: NOT DETECTED
Tricyclic, Ur Screen: NOT DETECTED

## 2016-09-26 NOTE — Progress Notes (Signed)
CC:RUQ pain Subjective: Patient with ongoing right upper quadrant pain no back pain no nausea vomiting no fevers or chills.  Objective: Vital signs in last 24 hours: Temp:  [98 F (36.7 C)-98.3 F (36.8 C)] 98.3 F (36.8 C) (10/21 0534) Pulse Rate:  [62-72] 62 (10/21 0534) Resp:  [16-17] 16 (10/21 0534) BP: (107-112)/(54-60) 107/57 (10/21 0534) SpO2:  [96 %-97 %] 97 % (10/21 0534) Last BM Date: 09/24/16  Intake/Output from previous day: 10/20 0701 - 10/21 0700 In: 3182.4 [P.O.:750; I.V.:2432.4] Out: 800 [Urine:800] Intake/Output this shift: No intake/output data recorded.  Physical exam:  Vital signs are stable she is afebrile No icterus no jaundice Patient appears comfortable and in no acute distress Abdomen is soft tender in the right upper quadrant with a questionable Murphy sign. Calves are nontender  Lab Results: CBC  No results for input(s): WBC, HGB, HCT, PLT in the last 72 hours. BMET  Recent Labs  09/26/16 0506  NA 141  K 4.0  CL 112*  CO2 23  GLUCOSE 83  BUN <5*  CREATININE 0.77  CALCIUM 8.3*   PT/INR No results for input(s): LABPROT, INR in the last 72 hours. ABG No results for input(s): PHART, HCO3 in the last 72 hours.  Invalid input(s): PCO2, PO2  Studies/Results: No results found.  Anti-infectives: Anti-infectives    Start     Dose/Rate Route Frequency Ordered Stop   09/22/16 2000  ciprofloxacin (CIPRO) IVPB 400 mg     400 mg 200 mL/hr over 60 Minutes Intravenous Every 12 hours 09/22/16 1713        Assessment/Plan:  LFTs slightly elevated today but more importantly her urine tox screen is again positive for cocaine metabolites.  I discussed her urine tox screen yesterday with Dr. Noralyn Pickarroll from anesthesia and today with Dr. Randa NgoPiscitello from anesthesia. While Dr. Noralyn Pickarroll felt that there is mild possibility that this could be cross reactivity or false positivity from some of her other medications Dr. Randa NgoPiscitello cyst stated that they had  researched her medication list and discussed with pharmacy that none of these would cause that and in fact that is a very extremely rare condition and a consider that this patient may still be using while in the hospital at any rate anesthesia is extremely reluctant to proceed with a general anesthesia unless I declare this case an emergency which I cannot as this likely represents mild acute cholecystitis. I see no sign of cholangitis no sign of sepsis to make this an emergency and we will try again tomorrow. I discussed this with nursing as well. Patient is extremely frustrated by this but anesthesia is not feel that it is safe to proceed with a general anesthetic under these conditions.  Lattie Hawichard E Cooper, MD, FACS  09/26/2016

## 2016-09-26 NOTE — Progress Notes (Signed)
Patient request to have pain medication increased because medication provided little relief at beginning of shift  and stated if relief wasn't obtain may leave AMA ; Surgical MD made aware that patient request increase in pain meds; No new orders obtain; patient educated on importance of medication and distraction techniques.

## 2016-09-27 ENCOUNTER — Inpatient Hospital Stay: Payer: Medicaid Other | Admitting: Certified Registered Nurse Anesthetist

## 2016-09-27 ENCOUNTER — Encounter: Payer: Self-pay | Admitting: Anesthesiology

## 2016-09-27 ENCOUNTER — Encounter
Admission: EM | Disposition: A | Discharge status: Home / Self Care | Payer: Self-pay | Source: Home / Self Care | Attending: Surgery

## 2016-09-27 ENCOUNTER — Inpatient Hospital Stay: Payer: Medicaid Other

## 2016-09-27 HISTORY — PX: CHOLECYSTECTOMY: SHX55

## 2016-09-27 LAB — COMPREHENSIVE METABOLIC PANEL
ALBUMIN: 2.8 g/dL — AB (ref 3.5–5.0)
ALT: 94 U/L — AB (ref 14–54)
AST: 43 U/L — AB (ref 15–41)
Alkaline Phosphatase: 51 U/L (ref 38–126)
Anion gap: 6 (ref 5–15)
CHLORIDE: 111 mmol/L (ref 101–111)
CO2: 25 mmol/L (ref 22–32)
CREATININE: 0.83 mg/dL (ref 0.44–1.00)
Calcium: 8.6 mg/dL — ABNORMAL LOW (ref 8.9–10.3)
GFR calc Af Amer: >60 mL/min (ref >60)
GFR calc non Af Amer: >60 mL/min (ref >60)
Glucose, Bld: 91 mg/dL (ref 65–99)
POTASSIUM: 4 mmol/L (ref 3.5–5.1)
SODIUM: 142 mmol/L (ref 135–145)
Total Bilirubin: 1 mg/dL (ref 0.3–1.2)
Total Protein: 5.5 g/dL — ABNORMAL LOW (ref 6.5–8.1)

## 2016-09-27 LAB — CBC WITH DIFFERENTIAL/PLATELET
BASOS ABS: 0.1 10*3/uL (ref 0–0.1)
BASOS PCT: 1 %
EOS ABS: 0.5 10*3/uL (ref 0–0.7)
EOS PCT: 7 %
HCT: 38.5 % (ref 35.0–47.0)
Hemoglobin: 12.6 g/dL (ref 12.0–16.0)
LYMPHS PCT: 40 %
Lymphs Abs: 2.9 10*3/uL (ref 1.0–3.6)
MCH: 28 pg (ref 26.0–34.0)
MCHC: 32.8 g/dL (ref 32.0–36.0)
MCV: 85.5 fL (ref 80.0–100.0)
MONO ABS: 0.5 10*3/uL (ref 0.2–0.9)
Monocytes Relative: 6 %
Neutro Abs: 3.4 10*3/uL (ref 1.4–6.5)
Neutrophils Relative %: 46 %
PLATELETS: 230 10*3/uL (ref 150–440)
RBC: 4.51 MIL/uL (ref 3.80–5.20)
RDW: 14.4 % (ref 11.5–14.5)
WBC: 7.4 10*3/uL (ref 3.6–11.0)

## 2016-09-27 LAB — URINE DRUG SCREEN, QUALITATIVE (ARMC ONLY)
Amphetamines, Ur Screen: NOT DETECTED
BARBITURATES, UR SCREEN: NOT DETECTED
Benzodiazepine, Ur Scrn: POSITIVE — AB
CANNABINOID 50 NG, UR ~~LOC~~: NOT DETECTED
COCAINE METABOLITE, UR ~~LOC~~: NOT DETECTED
MDMA (ECSTASY) UR SCREEN: NOT DETECTED
Methadone Scn, Ur: NOT DETECTED
OPIATE, UR SCREEN: POSITIVE — AB
Phencyclidine (PCP) Ur S: NOT DETECTED
TRICYCLIC, UR SCREEN: NOT DETECTED

## 2016-09-27 SURGERY — LAPAROSCOPIC CHOLECYSTECTOMY WITH INTRAOPERATIVE CHOLANGIOGRAM
Anesthesia: General

## 2016-09-27 MED ORDER — BUPIVACAINE-EPINEPHRINE (PF) 0.25% -1:200000 IJ SOLN
INTRAMUSCULAR | Status: DC | PRN
  Administered 2016-09-27: 30 mL via PERINEURAL

## 2016-09-27 MED ORDER — SUCCINYLCHOLINE CHLORIDE 20 MG/ML IJ SOLN
INTRAMUSCULAR | Status: DC | PRN
  Administered 2016-09-27: 20 mg via INTRAVENOUS
  Administered 2016-09-27: 100 mg via INTRAVENOUS

## 2016-09-27 MED ORDER — DEXAMETHASONE SODIUM PHOSPHATE 10 MG/ML IJ SOLN
INTRAMUSCULAR | Status: DC | PRN
  Administered 2016-09-27: 10 mg via INTRAVENOUS

## 2016-09-27 MED ORDER — CYCLOBENZAPRINE HCL 10 MG PO TABS
10.0000 mg | ORAL_TABLET | Freq: Three times a day (TID) | ORAL | Status: DC
  Administered 2016-09-27 – 2016-09-28 (×2): 10 mg via ORAL
  Filled 2016-09-27 (×2): qty 1

## 2016-09-27 MED ORDER — PROPOFOL 10 MG/ML IV BOLUS
INTRAVENOUS | Status: DC | PRN
  Administered 2016-09-27: 200 mg via INTRAVENOUS

## 2016-09-27 MED ORDER — ROCURONIUM BROMIDE 100 MG/10ML IV SOLN
INTRAVENOUS | Status: DC | PRN
  Administered 2016-09-27: 20 mg via INTRAVENOUS

## 2016-09-27 MED ORDER — FENTANYL CITRATE (PF) 100 MCG/2ML IJ SOLN
INTRAMUSCULAR | Status: AC
  Administered 2016-09-27: 25 ug via INTRAVENOUS
  Filled 2016-09-27: qty 2

## 2016-09-27 MED ORDER — ACETAMINOPHEN 10 MG/ML IV SOLN
INTRAVENOUS | Status: AC
  Filled 2016-09-27: qty 100

## 2016-09-27 MED ORDER — MIDAZOLAM HCL 2 MG/2ML IJ SOLN
INTRAMUSCULAR | Status: DC | PRN
  Administered 2016-09-27: 1 mg via INTRAVENOUS

## 2016-09-27 MED ORDER — IOTHALAMATE MEGLUMINE 60 % INJ SOLN
INTRAMUSCULAR | Status: DC | PRN
  Administered 2016-09-27: 15 mL

## 2016-09-27 MED ORDER — ACETAMINOPHEN 10 MG/ML IV SOLN
INTRAVENOUS | Status: DC | PRN
  Administered 2016-09-27: 1000 mg via INTRAVENOUS

## 2016-09-27 MED ORDER — PROMETHAZINE HCL 25 MG/ML IJ SOLN: 6.2500 mg | INTRAMUSCULAR | Status: DC | PRN

## 2016-09-27 MED ORDER — FENTANYL CITRATE (PF) 100 MCG/2ML IJ SOLN
25.0000 ug | INTRAMUSCULAR | Status: DC | PRN
  Administered 2016-09-27 (×4): 25 ug via INTRAVENOUS

## 2016-09-27 MED ORDER — KETOROLAC TROMETHAMINE 30 MG/ML IJ SOLN
30.0000 mg | Freq: Four times a day (QID) | INTRAMUSCULAR | Status: DC
  Administered 2016-09-27 – 2016-09-28 (×3): 30 mg via INTRAVENOUS
  Filled 2016-09-27 (×3): qty 1

## 2016-09-27 MED ORDER — ONDANSETRON HCL 4 MG/2ML IJ SOLN
INTRAMUSCULAR | Status: DC | PRN
  Administered 2016-09-27: 4 mg via INTRAVENOUS

## 2016-09-27 MED ORDER — LIDOCAINE HCL (CARDIAC) 20 MG/ML IV SOLN
INTRAVENOUS | Status: DC | PRN
Start: 2016-09-27 — End: 2016-09-27
  Administered 2016-09-27: 30 mg via INTRAVENOUS

## 2016-09-27 MED ORDER — BUPIVACAINE-EPINEPHRINE (PF) 0.25% -1:200000 IJ SOLN
INTRAMUSCULAR | Status: AC
  Filled 2016-09-27: qty 30

## 2016-09-27 MED ORDER — SUGAMMADEX SODIUM 200 MG/2ML IV SOLN
INTRAVENOUS | Status: DC | PRN
  Administered 2016-09-27: 200 mg via INTRAVENOUS

## 2016-09-27 MED ORDER — FENTANYL CITRATE (PF) 100 MCG/2ML IJ SOLN
INTRAMUSCULAR | Status: DC | PRN
  Administered 2016-09-27 (×2): 50 ug via INTRAVENOUS

## 2016-09-27 SURGICAL SUPPLY — 45 items
ADHESIVE MASTISOL STRL (MISCELLANEOUS) ×2 IMPLANT
APPLIER CLIP ROT 10 11.4 M/L (STAPLE) ×2
BLADE SURG SZ11 CARB STEEL (BLADE) ×2 IMPLANT
CANISTER SUCT 1200ML W/VALVE (MISCELLANEOUS) IMPLANT
CANISTER SUCT 3000ML PPV (MISCELLANEOUS) ×2 IMPLANT
CATH CHOLANGI 4FR 420404F (CATHETERS) ×2 IMPLANT
CHLORAPREP W/TINT 26ML (MISCELLANEOUS) ×2 IMPLANT
CLIP APPLIE ROT 10 11.4 M/L (STAPLE) ×1 IMPLANT
CONRAY 60ML FOR OR (MISCELLANEOUS) ×2 IMPLANT
DRAPE C-ARM XRAY 36X54 (DRAPES) ×2 IMPLANT
ELECT REM PT RETURN 9FT ADLT (ELECTROSURGICAL) ×2
ELECTRODE REM PT RTRN 9FT ADLT (ELECTROSURGICAL) ×1 IMPLANT
ENDOPOUCH RETRIEVER 10 (MISCELLANEOUS) ×2 IMPLANT
GAUZE SPONGE 4X4 12PLY STRL (GAUZE/BANDAGES/DRESSINGS) ×2 IMPLANT
GAUZE SPONGE NON-WVN 2X2 STRL (MISCELLANEOUS) ×4 IMPLANT
GLOVE BIO SURGEON STRL SZ8 (GLOVE) ×6 IMPLANT
GOWN STRL REUS W/ TWL LRG LVL3 (GOWN DISPOSABLE) ×4 IMPLANT
GOWN STRL REUS W/TWL LRG LVL3 (GOWN DISPOSABLE) ×4
IRRIGATION STRYKERFLOW (MISCELLANEOUS) ×1 IMPLANT
IRRIGATOR STRYKERFLOW (MISCELLANEOUS) ×2
IV CATH ANGIO 12GX3 LT BLUE (NEEDLE) ×2 IMPLANT
IV NS 1000ML (IV SOLUTION)
IV NS 1000ML BAXH (IV SOLUTION) IMPLANT
JACKSON PRATT 10 (INSTRUMENTS) ×2 IMPLANT
KIT RM TURNOVER STRD PROC AR (KITS) ×2 IMPLANT
LABEL OR SOLS (LABEL) ×2 IMPLANT
NDL SAFETY 22GX1.5 (NEEDLE) ×2 IMPLANT
NEEDLE VERESS 14GA 120MM (NEEDLE) ×2 IMPLANT
NS IRRIG 500ML POUR BTL (IV SOLUTION) ×2 IMPLANT
PACK LAP CHOLECYSTECTOMY (MISCELLANEOUS) ×2 IMPLANT
SCISSORS METZENBAUM CVD 33 (INSTRUMENTS) ×2 IMPLANT
SLEEVE ENDOPATH XCEL 5M (ENDOMECHANICALS) ×4 IMPLANT
SPONGE DRAIN TRACH 4X4 STRL 2S (GAUZE/BANDAGES/DRESSINGS) ×2 IMPLANT
SPONGE EXCIL AMD DRAIN 4X4 6P (MISCELLANEOUS) ×2 IMPLANT
SPONGE LAP 18X18 5 PK (GAUZE/BANDAGES/DRESSINGS) ×2 IMPLANT
SPONGE VERSALON 2X2 STRL (MISCELLANEOUS) ×4
STRIP CLOSURE SKIN 1/2X4 (GAUZE/BANDAGES/DRESSINGS) ×2 IMPLANT
SUT MNCRL 4-0 (SUTURE) ×1
SUT MNCRL 4-0 27XMFL (SUTURE) ×1
SUT VICRYL 0 AB UR-6 (SUTURE) ×2 IMPLANT
SUTURE MNCRL 4-0 27XMF (SUTURE) ×1 IMPLANT
SYR 20CC LL (SYRINGE) ×2 IMPLANT
TROCAR XCEL NON-BLD 11X100MML (ENDOMECHANICALS) ×2 IMPLANT
TROCAR XCEL NON-BLD 5MMX100MML (ENDOMECHANICALS) ×2 IMPLANT
TUBING INSUFFLATOR HI FLOW (MISCELLANEOUS) ×2 IMPLANT

## 2016-09-27 NOTE — Progress Notes (Signed)
Patient educated the benefits of SCD's but still refused.

## 2016-09-27 NOTE — Anesthesia Postprocedure Evaluation (Signed)
Anesthesia Post Note  Patient: [name]  Procedure(s) Performed: Procedure(s) (LRB): LAPAROSCOPIC CHOLECYSTECTOMY WITH INTRAOPERATIVE CHOLANGIOGRAM (N/A)  Patient location during evaluation: PACU Anesthesia Type: General Level of consciousness: awake and alert Pain management: pain level controlled Vital Signs Assessment: post-procedure vital signs reviewed and stable Respiratory status: spontaneous breathing, nonlabored ventilation and respiratory function stable Cardiovascular status: blood pressure returned to baseline and stable Postop Assessment: no signs of nausea or vomiting Anesthetic complications: no    Last Vitals:  Vitals:   09/27/16 1231 09/27/16 1257  BP: 133/71 127/65  Pulse: 73 68  Resp: 17 18  Temp: 37.1 C     Last Pain:  Vitals:   09/27/16 1523  TempSrc:   PainSc: 0-No pain                 Lenard SimmerAndrew Leslee Suire

## 2016-09-27 NOTE — Transfer of Care (Signed)
Immediate Anesthesia Transfer of Care Note  Patient: Rosalio MacadamiaMegan A Geigle  Procedure(s) Performed: Procedure(s): LAPAROSCOPIC CHOLECYSTECTOMY WITH INTRAOPERATIVE CHOLANGIOGRAM (N/A)  Patient Location: PACU  Anesthesia Type:General  Level of Consciousness: awake  Airway & Oxygen Therapy: Patient Spontanous Breathing and Patient connected to face mask oxygen  Post-op Assessment: Report given to RN and Post -op Vital signs reviewed and stable  Post vital signs: Reviewed and stable  Last Vitals:  Vitals:   09/27/16 0937 09/27/16 1117  BP: (!) 103/51 (!) 141/67  Pulse: 70 99  Resp: 16 (!) 23  Temp: 36.8 C 36.9 C    Last Pain:  Vitals:   09/27/16 1117  TempSrc: Tympanic  PainSc:       Patients Stated Pain Goal: 7 (09/26/16 1703)  Complications: No apparent anesthesia complications

## 2016-09-27 NOTE — Progress Notes (Signed)
CC: Right upper quadrant pain Subjective: This patient with acute cholecystitis to surgery is been delayed by repeated cocaine metabolites found on urine qualitative studies. She describes ongoing right upper quadrant pain with some nausea but no emesis no back pain no fevers or chills no jaundice or acholic stools.  Objective: Vital signs in last 24 hours: Temp:  [98 F (36.7 C)-98.5 F (36.9 C)] 98.4 F (36.9 C) (10/22 0419) Pulse Rate:  [63-70] 63 (10/22 0419) Resp:  [18-22] 20 (10/22 0419) BP: (86-110)/(39-62) 96/62 (10/22 0601) SpO2:  [96 %-99 %] 96 % (10/22 0419) Last BM Date: 09/24/16  Intake/Output from previous day: 10/21 0701 - 10/22 0700 In: 2834.4 [I.V.:2834.4] Out: 1800 [Urine:1800] Intake/Output this shift: No intake/output data recorded.  Physical exam:  Vital signs are stable and reviewed. No icterus Abdomen is tender in the right upper quadrant with a positive Murphy sign Patient up and walking without calf tenderness No jaundice  Lab Results: CBC   Recent Labs  09/27/16 0544  WBC 7.4  HGB 12.6  HCT 38.5  PLT 230   BMET  Recent Labs  09/26/16 0506 09/27/16 0544  NA 141 142  K 4.0 4.0  CL 112* 111  CO2 23 25  GLUCOSE 83 91  BUN <5* <5*  CREATININE 0.77 0.83  CALCIUM 8.3* 8.6*   PT/INR No results for input(s): LABPROT, INR in the last 72 hours. ABG No results for input(s): PHART, HCO3 in the last 72 hours.  Invalid input(s): PCO2, PO2  Studies/Results: No results found.  Anti-infectives: Anti-infectives    Start     Dose/Rate Route Frequency Ordered Stop   09/22/16 2000  ciprofloxacin (CIPRO) IVPB 400 mg     400 mg 200 mL/hr over 60 Minutes Intravenous Every 12 hours 09/22/16 1713        Assessment/Plan:  Patient with acute cholecystitis and positive cocaine studies up until today today her urine tox screen is negative for cocaine metabolites. She will be scheduled for surgery today. I recommended a cholangiography as well  as laparoscopic cholecystectomy because her LFTs are slightly elevated. I suspect this is related to her acute cholecystitis and not choledocholithiasis. The rationale for surgery is been discussed the options of observation reviewed the risk of bleeding infection recurrence of symptoms failure to resolve her symptoms conversion to an open procedure bile duct damage bile duct leak retained common bile duct stone any of which could require further surgery and/or ERCP stent papillotomy or bowel injury were all reviewed with her she understood and agreed to proceed nursing was present for this discussion.  Lattie Hawichard E Adithi Gammon, MD, FACS  09/27/2016

## 2016-09-27 NOTE — Anesthesia Procedure Notes (Signed)
Procedure Name: Intubation Date/Time: 09/27/2016 10:09 AM Performed by: Ginger CarneMICHELET, Masyn Rostro Pre-anesthesia Checklist: Patient identified, Emergency Drugs available, Suction available, Patient being monitored and Timeout performed Patient Re-evaluated:Patient Re-evaluated prior to inductionOxygen Delivery Method: Circle system utilized Preoxygenation: Pre-oxygenation with 100% oxygen Intubation Type: IV induction Ventilation: Mask ventilation without difficulty Laryngoscope Size: Miller and 2 Grade View: Grade I Tube type: Oral Tube size: 7.0 mm Number of attempts: 1 Airway Equipment and Method: Stylet Placement Confirmation: ETT inserted through vocal cords under direct vision,  positive ETCO2 and breath sounds checked- equal and bilateral Secured at: 21 cm Tube secured with: Tape Dental Injury: Teeth and Oropharynx as per pre-operative assessment

## 2016-09-27 NOTE — Op Note (Signed)
Laparoscopic Cholecystectomy  Pre-operative Diagnosis: Acute cholecystitis  Post-operative Diagnosis: Acute cholecystitis with hydrops  Procedure: Laparoscopic cholecystectomy with C-arm fluoroscopic cholangiography  Surgeon: Adah Salvage. Excell Seltzer, MD FACS  Anesthesia: Gen. with endotracheal tube  Assistant: Surgical tech  Procedure Details  The patient was seen again in the Holding Room. The benefits, complications, treatment options, and expected outcomes were discussed with the patient. The risks of bleeding, infection, recurrence of symptoms, failure to resolve symptoms, bile duct damage, bile duct leak, retained common bile duct stone, bowel injury, any of which could require further surgery and/or ERCP, stent, or papillotomy were reviewed with the patient. The likelihood of improving the patient's symptoms with return to their baseline status is good.  The patient and/or family concurred with the proposed plan, giving informed consent.  The patient was taken to Operating Room, identified as Rosalio Macadamia and the procedure verified as Laparoscopic Cholecystectomy.  A Time Out was held and the above information confirmed.  Prior to the induction of general anesthesia, antibiotic prophylaxis was administered. VTE prophylaxis was in place. General endotracheal anesthesia was then administered and tolerated well. After the induction, the abdomen was prepped with Chloraprep and draped in the sterile fashion. The patient was positioned in the supine position.  Local anesthetic  was injected into the skin near the umbilicus and an incision made. A site was chosen cephalad to the existing piercing and away from her from prior umbilical hernia repair that was believed to have mesh. The Veress needle was placed. Pneumoperitoneum was then created with CO2 and tolerated well without any adverse changes in the patient's vital signs. A 5mm port was placed in the periumbilical position using a Visiport and  direct visualization technique and the abdominal cavity was explored.  Two 5-mm ports were placed in the right upper quadrant and a 12 mm epigastric port was placed all under direct vision. All skin incisions  were infiltrated with a local anesthetic agent before making the incision and placing the trocars. There were adhesions in the pelvis and around the infraumbilical area no mesh was visualized and no adhesions in the area of the periumbilical port placement.  The patient was positioned  in reverse Trendelenburg, tilted slightly to the patient's left.  The gallbladder was identified, the fundus grasped and retracted cephalad. Adhesions were lysed bluntly. The infundibulum was grasped and retracted laterally, exposing the peritoneum overlying the triangle of Calot. This was then divided and exposed in a blunt fashion. A critical view of the cystic duct and cystic artery was obtained.  The cystic duct was clearly identified and bluntly dissected.   The cystic lymphatics were doubly clipped and divided and the cystic duct was clipped incised and through a separate incision and Angiocath a glandular gram catheter was placed. C-arm fluoroscopic cholangiography demonstrated good flow in the distal bile duct without intraluminal filling defects proximal ducts were identified and the cystic duct was noted to be cannulated and very long.  The cystic duct catheter was removed and the cystic duct was doubly clipped and divided cystic artery was doubly clipped and divided.  The gallbladder was taken from the gallbladder fossa in a retrograde fashion with the electrocautery. The gallbladder was removed and placed in an Endocatch bag. It was found to have a stone impacted in the infundibulum of the gallbladder and hydropic bile behind it. The liver bed was irrigated and inspected. Hemostasis was achieved with the electrocautery. Copious irrigation was utilized and was repeatedly aspirated until clear.  The  gallbladder  and Endocatch sac were then removed through the epigastric port site.   A 10 mm JP drain was brought into a lateral port site and placed in the foramen of Winslow and tied in with 3-0 nylon.  Inspection of the right upper quadrant was performed. No bleeding, bile duct injury or leak, or bowel injury was noted. Pneumoperitoneum was released.  The epigastric port site was closed with figure-of-eight 0 Vicryl sutures. 4-0 subcuticular Monocryl was used to close the skin. Steristrips and Mastisol and sterile dressings were  applied.  The patient was then extubated and brought to the recovery room in stable condition. Sponge, lap, and needle counts were correct at closure and at the conclusion of the case.   Findings: Acute Cholecystitis with hydrops   Estimated Blood Loss: 25 cc         Drains: JP 1         Specimens: Gallbladder           Complications: none               Devanie Galanti E. Excell Seltzerooper, MD, FACS

## 2016-09-27 NOTE — Anesthesia Preprocedure Evaluation (Signed)
Anesthesia Evaluation  Patient identified by MRN, date of birth, ID band Patient awake    Reviewed: Allergy & Precautions, H&P , NPO status , Patient's Chart, lab work & pertinent test results, reviewed documented beta blocker date and time   History of Anesthesia Complications Negative for: history of anesthetic complications  Airway Mallampati: I  TM Distance: >3 FB Neck ROM: full    Dental no notable dental hx. 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,   Endo/Other  diabetes (gestational DM, but no problems now)  Renal/GU negative Renal ROS  negative genitourinary   Musculoskeletal   Abdominal   Peds  Hematology negative hematology ROS (+)   Anesthesia Other Findings Past Medical History: No date: Headache No date: Heroin abuse   Reproductive/Obstetrics negative OB ROS                             Anesthesia Physical Anesthesia Plan  ASA: II  Anesthesia Plan: General   Post-op Pain Management:    Induction:   Airway Management Planned:   Additional Equipment:   Intra-op Plan:   Post-operative Plan:   Informed Consent: I have reviewed the patients History and Physical, chart, labs and discussed the procedure including the risks, benefits and alternatives for the proposed anesthesia with the patient or authorized representative who has indicated his/her understanding and acceptance.   Dental Advisory Given  Plan Discussed with: Anesthesiologist, CRNA and Surgeon  Anesthesia Plan Comments:         Anesthesia Quick Evaluation

## 2016-09-28 ENCOUNTER — Encounter: Payer: Self-pay | Admitting: Surgery

## 2016-09-28 LAB — CBC WITH DIFFERENTIAL/PLATELET
BASOS ABS: 0 10*3/uL (ref 0–0.1)
BASOS PCT: 0 %
EOS PCT: 0 %
Eosinophils Absolute: 0 10*3/uL (ref 0–0.7)
HEMATOCRIT: 35.3 % (ref 35.0–47.0)
Hemoglobin: 12 g/dL (ref 12.0–16.0)
Lymphocytes Relative: 9 %
Lymphs Abs: 1.2 10*3/uL (ref 1.0–3.6)
MCH: 28.8 pg (ref 26.0–34.0)
MCHC: 34.1 g/dL (ref 32.0–36.0)
MCV: 84.5 fL (ref 80.0–100.0)
MONO ABS: 0.8 10*3/uL (ref 0.2–0.9)
MONOS PCT: 6 %
NEUTROS ABS: 11.6 10*3/uL — AB (ref 1.4–6.5)
Neutrophils Relative %: 85 %
PLATELETS: 243 10*3/uL (ref 150–440)
RBC: 4.18 MIL/uL (ref 3.80–5.20)
RDW: 14.2 % (ref 11.5–14.5)
WBC: 13.7 10*3/uL — ABNORMAL HIGH (ref 3.6–11.0)

## 2016-09-28 LAB — COMPREHENSIVE METABOLIC PANEL
ALBUMIN: 3 g/dL — AB (ref 3.5–5.0)
ALT: 75 U/L — ABNORMAL HIGH (ref 14–54)
ANION GAP: 4 — AB (ref 5–15)
AST: 37 U/L (ref 15–41)
Alkaline Phosphatase: 54 U/L (ref 38–126)
BILIRUBIN TOTAL: 0.7 mg/dL (ref 0.3–1.2)
BUN: 7 mg/dL (ref 6–20)
CHLORIDE: 109 mmol/L (ref 101–111)
CO2: 25 mmol/L (ref 22–32)
Calcium: 8.5 mg/dL — ABNORMAL LOW (ref 8.9–10.3)
Creatinine, Ser: 0.8 mg/dL (ref 0.44–1.00)
GFR calc Af Amer: >60 mL/min (ref >60)
Glucose, Bld: 254 mg/dL — ABNORMAL HIGH (ref 65–99)
POTASSIUM: 4.2 mmol/L (ref 3.5–5.1)
Sodium: 138 mmol/L (ref 135–145)
TOTAL PROTEIN: 5.9 g/dL — AB (ref 6.5–8.1)

## 2016-09-28 MED ORDER — CYCLOBENZAPRINE HCL 10 MG PO TABS: 10.0000 mg | ORAL_TABLET | Freq: Three times a day (TID) | ORAL | 0 refills | Status: DC

## 2016-09-28 MED ORDER — ACETAMINOPHEN 325 MG PO TABS: 650.0000 mg | ORAL_TABLET | Freq: Four times a day (QID) | ORAL | 0 refills | Status: DC

## 2016-09-28 MED ORDER — OXYCODONE HCL 5 MG PO TABS: 5.0000 mg | ORAL_TABLET | ORAL | 0 refills | Status: DC | PRN

## 2016-09-28 MED ORDER — IBUPROFEN 600 MG PO TABS: 600.0000 mg | ORAL_TABLET | Freq: Three times a day (TID) | ORAL | 0 refills | Status: DC | PRN

## 2016-09-28 MED ORDER — OXYCODONE HCL 5 MG PO TABS: 5.0000 mg | ORAL_TABLET | ORAL | Status: DC | PRN

## 2016-09-28 MED ORDER — ACETAMINOPHEN 325 MG PO TABS
650.0000 mg | ORAL_TABLET | Freq: Four times a day (QID) | ORAL | Status: DC
  Administered 2016-09-28: 650 mg via ORAL
  Filled 2016-09-28: qty 2

## 2016-09-28 MED ORDER — HYDROMORPHONE HCL 1 MG/ML IJ SOLN
0.5000 mg | INTRAMUSCULAR | Status: DC | PRN
  Administered 2016-09-28: 0.5 mg via INTRAVENOUS
  Filled 2016-09-28: qty 1

## 2016-09-28 NOTE — Progress Notes (Signed)
Patient wanted to leave prior to appointment scheduling offered by staff. Patient to call to make appointment. JP drain removed per order. IV site removed.

## 2016-09-28 NOTE — Progress Notes (Signed)
09/28/2016  Subjective: Patient is 1 Day Post-Op from laparoscopic cholecystectomy with intraoperative cholangiogram. Patient is currently doing well and tolerating a diet. He has required IV pain medication for pain control. Otherwise denies any nausea or vomiting.  Vital signs: Temp:  [97.7 F (36.5 C)-98.7 F (37.1 C)] 97.9 F (36.6 C) (10/23 0432) Pulse Rate:  [55-99] 55 (10/23 0432) Resp:  [17-23] 18 (10/23 0432) BP: (101-141)/(47-77) 101/47 (10/23 0432) SpO2:  [92 %-100 %] 92 % (10/23 0432)   Intake/Output: 10/22 0701 - 10/23 0700 In: 6098.7 [P.O.:2400; I.V.:3668.7] Out: 464 [Urine:401; Drains:60; Blood:3] Last BM Date: 09/29/16  Physical Exam: Constitutional: No acute distress Abdomen: Soft, nondistended, appropriately tender. Incisions are clean dry and intact with dressings in place. She has a right upper quadrant JP drain which is draining serosanguineous fluid.  Labs:   Recent Labs  09/27/16 0544 09/28/16 0605  WBC 7.4 13.7*  HGB 12.6 12.0  HCT 38.5 35.3  PLT 230 243    Recent Labs  09/27/16 0544 09/28/16 0605  NA 142 138  K 4.0 4.2  CL 111 109  CO2 25 25  GLUCOSE 91 254*  BUN <5* 7  CREATININE 0.83 0.80  CALCIUM 8.6* 8.5*   No results for input(s): LABPROT, INR in the last 72 hours.  Imaging: Dg Cholangiogram Operative  Result Date: 09/27/2016 CLINICAL DATA:  Acute calculus cholecystitis EXAM: INTRAOPERATIVE CHOLANGIOGRAM TECHNIQUE: Cholangiographic images from the C-arm fluoroscopic device were submitted for interpretation post-operatively. Please see the procedural report for the amount of contrast and the fluoroscopy time utilized. COMPARISON:  09/22/2016 FINDINGS: Intraoperative cholangiogram performed during the laparoscopic cholecystectomy. There is leakage of contrast at the cystic duct catheter along the liver margin. The cystic duct, visualized common hepatic duct, and common bile duct are patent. Trace amount contrast is seen into the  duodenum. No dilatation or obstruction. No filling defect. IMPRESSION: Patent biliary system. Electronically Signed   By: Judie PetitM.  Shick M.D.   On: 09/27/2016 11:18    Assessment/Plan: This is a 30 year old female status post laparoscopic cholecystectomy. She is currently recovering well.  -We'll transition all her medications to by mouth including pain medications. -For pain is well-controlled she may be able to be discharged as early as today. If that is the case we will remove her JP drain prior to discharge.   [name], MD [facility]

## 2016-09-28 NOTE — Discharge Summary (Signed)
Patient ID: [name] MRN: [id number] DOB/AGE: April 02, 1986 30 y.o.  Admit date: 09/22/2016 Discharge date: 09/28/2016   Discharge Diagnoses:  Active Problems:   Acute cholecystitis   Symptomatic cholelithiasis   Procedures: Laparoscopic cholecystectomy with intraoperative cholangiogram  Hospital Course: Patient was admitted on 09/22/2016 with diagnosis of acute cholecystitis. She was started on IV antibiotics. She did have a positive cocaine test from urine toxicology. Surgery was postponed until her urine tox screen was negative. She was eventually taken to the operating room on 10/22 and underwent a upper scopic O cystectomy with intraoperative cholangiogram with no complications. A JP drain had been left in the right upper quadrant over the gallbladder fossa. On postoperative day 1 the patient's diet was advance her pain was well-controlled. A JP drain was draining only serosanguineous fluid and was removed prior to discharge. She was ambulating was tolerating a diet and her pain was well-controlled and had bowel function and was deemed ready for discharge to home.  Consults: None  Disposition:  home with self-care  Discharge Instructions    Call MD for:  difficulty breathing, headache or visual disturbances    Complete by:  As directed    Call MD for:  persistant nausea and vomiting    Complete by:  As directed    Call MD for:  redness, tenderness, or signs of infection (pain, swelling, redness, odor or green/yellow discharge around incision site)    Complete by:  As directed    Call MD for:  severe uncontrolled pain    Complete by:  As directed    Call MD for:  temperature >100.4    Complete by:  As directed    Diet - low sodium heart healthy    Complete by:  As directed    Discharge instructions    Complete by:  As directed    May shower on 10/24.  Do not submerge wounds in pool/tub   Driving Restrictions    Complete by:  As directed    Do not drive while taking  narcotics for pain control   Increase activity slowly    Complete by:  As directed    Lifting restrictions    Complete by:  As directed    No heavy lifting of more than 10 pounds for 4 weeks   Remove dressing in 24 hours    Complete by:  As directed    May change dressing with dry gauze and tape as needed.       Medication List    TAKE these medications   acetaminophen 325 MG tablet Commonly known as:  TYLENOL Take 2 tablets (650 mg total) by mouth every 6 (six) hours.   CELEBREX 200 MG capsule Generic drug:  celecoxib Take 1 capsule by mouth daily.   cyclobenzaprine 10 MG tablet Commonly known as:  FLEXERIL Take 1 tablet (10 mg total) by mouth 3 (three) times daily.   hydrOXYzine 50 MG tablet Commonly known as:  ATARAX/VISTARIL Take 1 tablet by mouth at bedtime.   ibuprofen 600 MG tablet Commonly known as:  ADVIL,MOTRIN Take 1 tablet (600 mg total) by mouth every 8 (eight) hours as needed for fever, mild pain or moderate pain.   MUCINEX ALLERGY PO Take by mouth as needed.   oxyCODONE 5 MG immediate release tablet Commonly known as:  Oxy IR/ROXICODONE Take 1 tablet (5 mg total) by mouth every 4 (four) hours as needed for breakthrough pain.   QUEtiapine 50 MG tablet Commonly known as:  SEROQUEL Take 1 tablet by mouth at bedtime.   SUBOXONE SL Place 6 mg under the tongue daily.   ZUBSOLV 5.7-1.4 MG Subl Generic drug:  Buprenorphine HCl-Naloxone HCl Take 1 tablet by mouth 3 (three) times daily.   topiramate 50 MG tablet Commonly known as:  TOPAMAX Take 1 tablet by mouth daily.   traZODone 150 MG tablet Commonly known as:  DESYREL Take 1 tablet by mouth at bedtime.   ZANTAC 150 MG tablet Generic drug:  ranitidine Take 150 mg by mouth daily.      Follow-up Information    Dionne Milo, MD Follow up in 2 week(s).   Specialty:  Surgery Contact information: [address] [place] [address] [phone]

## 2016-09-29 LAB — SURGICAL PATHOLOGY

## 2016-11-01 ENCOUNTER — Encounter: Payer: Self-pay | Admitting: Emergency Medicine

## 2016-11-01 ENCOUNTER — Emergency Department
Admission: EM | Admit: 2016-11-01 | Discharge: 2016-11-01 | Disposition: A | Discharge status: Home / Self Care | Payer: Medicaid Other | Attending: Emergency Medicine | Admitting: Emergency Medicine

## 2016-11-01 DIAGNOSIS — H60392 Other infective otitis externa, left ear: Secondary | ICD-10-CM

## 2016-11-01 DIAGNOSIS — Z79899 Other long term (current) drug therapy: Secondary | ICD-10-CM | POA: Insufficient documentation

## 2016-11-01 DIAGNOSIS — F17210 Nicotine dependence, cigarettes, uncomplicated: Secondary | ICD-10-CM | POA: Diagnosis not present

## 2016-11-01 DIAGNOSIS — H9202 Otalgia, left ear: Secondary | ICD-10-CM | POA: Diagnosis present

## 2016-11-01 DIAGNOSIS — F1110 Opioid abuse, uncomplicated: Secondary | ICD-10-CM | POA: Diagnosis not present

## 2016-11-01 MED ORDER — HYDROCODONE-ACETAMINOPHEN 5-325 MG PO TABS: 1.0000 | ORAL_TABLET | ORAL | 0 refills | Status: DC | PRN

## 2016-11-01 MED ORDER — SULFAMETHOXAZOLE-TRIMETHOPRIM 800-160 MG PO TABS
1.0000 | ORAL_TABLET | Freq: Two times a day (BID) | ORAL | 0 refills | Status: DC
Start: 2016-11-01 — End: 2017-08-23

## 2016-11-01 MED ORDER — NEOMYCIN-POLYMYXIN-HC 3.5-10000-1 OT SOLN: 3.0000 [drp] | Freq: Four times a day (QID) | OTIC | 0 refills | Status: AC

## 2016-11-01 NOTE — Discharge Instructions (Signed)
Begin taking Bactrim DS twice a day for 10 days. Hydrocodone for pain as needed every 4 hours. Cortisporin otic suspension to the ear. This medication has an antibiotic as well as cortisone which should decrease some of the swelling in the area. Call [facility] for an appointment.

## 2016-11-01 NOTE — ED Provider Notes (Signed)
[facility] Emergency Department Provider Note  ____________________________________________   First MD Initiated Contact with Patient 11/01/16 1611     (approximate)  I have reviewed the triage vital signs and the nursing notes.   HISTORY  Chief Complaint Otalgia   HPI [name] is a 30 y.o. female . Complained of pain to her left ear. Patient states this been going on for approximately 3 weeks. She states that feels as if there is a "bump" inside her ear canal. She denies any fever, chills, nausea or vomiting. She has not experienced any drainage from her ear. She denies any previous problems with her left ear. She has not taken any over-the-counter medication for her ear. Currently she rates her pain is 7 out of 10.   Past Medical History:  Diagnosis Date  . Headache   . Heroin abuse     Patient Active Problem List   Diagnosis Date Noted  . Symptomatic cholelithiasis   . Acute cholecystitis 09/22/2016    Past Surgical History:  Procedure Laterality Date  . ANKLE SURGERY Left 2012  . CESAREAN SECTION  2011  . CHOLECYSTECTOMY N/A 09/27/2016   Procedure: LAPAROSCOPIC CHOLECYSTECTOMY WITH INTRAOPERATIVE CHOLANGIOGRAM;  Surgeon: Lattie Hawichard E Cooper, MD;  Location: [facility] ORS;  Service: General;  Laterality: N/A;  . DILATION AND CURETTAGE OF UTERUS  2013  . TONSILLECTOMY  2007    Prior to Admission medications   Medication Sig Start Date End Date Taking? Authorizing Provider  acetaminophen (TYLENOL) 325 MG tablet Take 2 tablets (650 mg total) by mouth every 6 (six) hours. 09/28/16   Henrene DodgeJose Piscoya, MD  Buprenorphine HCl-Naloxone HCl (SUBOXONE SL) Place 6 mg under the tongue daily.    Historical Provider, MD  CELEBREX 200 MG capsule Take 1 capsule by mouth daily. 09/17/16   Historical Provider, MD  cyclobenzaprine (FLEXERIL) 10 MG tablet Take 1 tablet (10 mg total) by mouth 3 (three) times daily. 09/28/16   Henrene DodgeJose Piscoya, MD  Fexofenadine HCl  [facility](MUCINEX ALLERGY PO) Take by mouth as needed.    Historical Provider, MD  HYDROcodone-acetaminophen (NORCO/VICODIN) 5-325 MG tablet Take 1 tablet by mouth every 4 (four) hours as needed for moderate pain. 11/01/16   Tommi Rumpshonda L Jaquetta Currier, PA-C  hydrOXYzine (ATARAX/VISTARIL) 50 MG tablet Take 1 tablet by mouth at bedtime. 09/17/16   Historical Provider, MD  ibuprofen (ADVIL,MOTRIN) 600 MG tablet Take 1 tablet (600 mg total) by mouth every 8 (eight) hours as needed for fever, mild pain or moderate pain. 09/28/16   Henrene DodgeJose Piscoya, MD  neomycin-polymyxin-hydrocortisone (CORTISPORIN) otic solution Place 3 drops into the left ear 4 (four) times daily. 11/01/16 11/11/16  Tommi Rumpshonda L Berneice Zettlemoyer, PA-C  oxyCODONE (OXY IR/ROXICODONE) 5 MG immediate release tablet Take 1 tablet (5 mg total) by mouth every 4 (four) hours as needed for breakthrough pain. 09/28/16   Henrene DodgeJose Piscoya, MD  QUEtiapine (SEROQUEL) 50 MG tablet Take 1 tablet by mouth at bedtime. 09/17/16   Historical Provider, MD  ranitidine (ZANTAC) 150 MG tablet Take 150 mg by mouth daily.    Historical Provider, MD  sulfamethoxazole-trimethoprim (BACTRIM DS,SEPTRA DS) 800-160 MG tablet Take 1 tablet by mouth 2 (two) times daily. 11/01/16   Tommi Rumpshonda L Io Dieujuste, PA-C  topiramate (TOPAMAX) 50 MG tablet Take 1 tablet by mouth daily. 09/17/16   Historical Provider, MD  traZODone (DESYREL) 150 MG tablet Take 1 tablet by mouth at bedtime. 09/17/16   Historical Provider, MD  ZUBSOLV 5.7-1.4 MG SUBL Take 1 tablet by mouth  3 (three) times daily. 09/17/16   Historical Provider, MD    Allergies Clindamycin/lincomycin and Amoxicillin  Family History  Problem Relation Age of Onset  . Cancer Mother     skin    Social History Social History  Substance Use Topics  . Smoking status: Current Every Day Smoker    Packs/day: 1.00    Years: 13.00    Types: Cigarettes  . Smokeless tobacco: Never Used  . Alcohol use No    Review of Systems Constitutional: No fever/chills Eyes:  No visual changes. ENT: No sore throat. Positive painful left ear. Cardiovascular: Denies chest pain. Respiratory: Denies shortness of breath. Gastrointestinal:   No nausea, no vomiting.   Musculoskeletal: Negative for back pain. Skin: Questionable papule left ear canal. Neurological: Negative for headaches  10-point ROS otherwise negative.  ____________________________________________   PHYSICAL EXAM:  VITAL SIGNS: ED Triage Vitals  Enc Vitals Group     BP 11/01/16 1555 (!) 127/91     Pulse Rate 11/01/16 1555 91     Resp 11/01/16 1555 18     Temp 11/01/16 1555 98.1 F (36.7 C)     Temp Source 11/01/16 1555 Oral     SpO2 11/01/16 1555 99 %     Weight 11/01/16 1555 175 lb (79.4 kg)     Height 11/01/16 1555 5\' 4"  (1.626 m)     Head Circumference --      Peak Flow --      Pain Score 11/01/16 1556 7     Pain Loc --      Pain Edu? --      Excl. in GC? --     Constitutional: Alert and oriented. Well appearing and in no acute distress. Eyes: Conjunctivae are normal. PERRL. EOMI. Head: Atraumatic. Nose: No congestion/rhinnorhea. Right EAC and TM are clear. Left EAC there is moderate tenderness and there is a flesh-colored papule in the canal which prevents visualization of the TM. There is no drainage. Mouth/Throat: Mucous membranes are moist.  Oropharynx non-erythematous. Neck: No stridor.   Hematological/Lymphatic/Immunilogical: No cervical lymphadenopathy. Cardiovascular: Normal rate, regular rhythm. Grossly normal heart sounds.  Good peripheral circulation. Respiratory: Normal respiratory effort.  No retractions. Lungs CTAB. Musculoskeletal: No lower extremity tenderness nor edema.  No joint effusions. Neurologic:  Normal speech and language. No gross focal neurologic deficits are appreciated. No gait instability. Skin:  Skin is warm, dry and intact. As noted above in left EAC. Psychiatric: Mood and affect are normal. Speech and behavior are  normal.  ____________________________________________   LABS (all labs ordered are listed, but only abnormal results are displayed)  Labs Reviewed - No data to display   PROCEDURES  Procedure(s) performed: None  Procedures  Critical Care performed: No  ____________________________________________   INITIAL IMPRESSION / ASSESSMENT AND PLAN / ED COURSE  Pertinent labs & imaging results that were available during my care of the patient were reviewed by me and considered in my medical decision making (see chart for details).    Clinical Course    Patient was given information to follow-up with [facility] for her ear. She is also given a prescription for Norco as needed for pain, Bactrim DS twice a day for 10 days and Cortisporin otic suspension. Patient was made aware that I&D of this area may be required and that she needs to start on antibiotics today.   ____________________________________________   FINAL CLINICAL IMPRESSION(S) / ED DIAGNOSES  Final diagnoses:  Otalgia of left ear  Other infective acute  otitis externa of left ear      NEW MEDICATIONS STARTED DURING THIS VISIT:  Discharge Medication List as of 11/01/2016  5:01 PM    START taking these medications   Details  HYDROcodone-acetaminophen (NORCO/VICODIN) 5-325 MG tablet Take 1 tablet by mouth every 4 (four) hours as needed for moderate pain., Starting Sun 11/01/2016, Print    neomycin-polymyxin-hydrocortisone (CORTISPORIN) otic solution Place 3 drops into the left ear 4 (four) times daily., Starting Sun 11/01/2016, Until Wed 11/11/2016, Print    sulfamethoxazole-trimethoprim (BACTRIM DS,SEPTRA DS) 800-160 MG tablet Take 1 tablet by mouth 2 (two) times daily., Starting Sun 11/01/2016, Print         Note:  This document was prepared using Dragon voice recognition software and may include unintentional dictation errors.    Tommi Rumps, PA-C 11/01/16 1811    Jeanmarie Plant, MD 11/01/16  2103

## 2016-11-01 NOTE — ED Triage Notes (Signed)
States she developed left ear pain about 3 weeks ago   Now feels possible abscess area inside ear

## 2017-07-08 ENCOUNTER — Emergency Department
Admission: EM | Admit: 2017-07-08 | Discharge: 2017-07-08 | Disposition: A | Discharge status: Home / Self Care | Payer: Medicaid Other | Attending: Emergency Medicine | Admitting: Emergency Medicine

## 2017-07-08 ENCOUNTER — Encounter: Payer: Self-pay | Admitting: Emergency Medicine

## 2017-07-08 ENCOUNTER — Emergency Department: Payer: Medicaid Other

## 2017-07-08 DIAGNOSIS — M25561 Pain in right knee: Secondary | ICD-10-CM | POA: Diagnosis present

## 2017-07-08 DIAGNOSIS — F17210 Nicotine dependence, cigarettes, uncomplicated: Secondary | ICD-10-CM | POA: Insufficient documentation

## 2017-07-08 DIAGNOSIS — Z79899 Other long term (current) drug therapy: Secondary | ICD-10-CM | POA: Insufficient documentation

## 2017-07-08 MED ORDER — DICLOFENAC SODIUM 50 MG PO TBEC: 50.0000 mg | DELAYED_RELEASE_TABLET | Freq: Two times a day (BID) | ORAL | 0 refills | Status: AC

## 2017-07-08 MED ORDER — KETOROLAC TROMETHAMINE 30 MG/ML IJ SOLN
60.0000 mg | Freq: Once | INTRAMUSCULAR | Status: AC
  Administered 2017-07-08: 60 mg via INTRAMUSCULAR
  Filled 2017-07-08: qty 2

## 2017-07-08 NOTE — ED Notes (Signed)
See triage note  States she developed right knee pain about 4 days ago w/o injury has min swelling and states pain is under knee cap ambulates with slight limp d/t pain

## 2017-07-08 NOTE — ED Provider Notes (Signed)
[facility] Emergency Department Provider Note   ____________________________________________   I have reviewed the triage vital signs and the nursing notes.   HISTORY  Chief Complaint Knee Pain    HPI [name] is a 31 y.o. female presents to the emergency department with right knee pain and swelling without traumatic injury. Patient describes pain along the anterior superior and inferior aspect of the knee. She notes a sense of fullness in the knee joint that increases in discomfort with knee flexion. Patient denies any past history of knee injury or surgery to the right knee. Patient denies any right lower leg pain, swelling or changes in sensation or loss of pulses. Patient denies fever, chills, headache, vision changes, chest pain, chest tightness, shortness of breath, abdominal pain, nausea and vomiting.  Past Medical History:  Diagnosis Date  . Headache   . Heroin abuse     Patient Active Problem List   Diagnosis Date Noted  . Symptomatic cholelithiasis   . Acute cholecystitis 09/22/2016    Past Surgical History:  Procedure Laterality Date  . ANKLE SURGERY Left 2012  . CESAREAN SECTION  2011  . CHOLECYSTECTOMY N/A 09/27/2016   Procedure: LAPAROSCOPIC CHOLECYSTECTOMY WITH INTRAOPERATIVE CHOLANGIOGRAM;  Surgeon: Lattie Haw, MD;  Location: [facility] ORS;  Service: General;  Laterality: N/A;  . DILATION AND CURETTAGE OF UTERUS  2013  . TONSILLECTOMY  2007    Prior to Admission medications   Medication Sig Start Date End Date Taking? Authorizing Provider  acetaminophen (TYLENOL) 325 MG tablet Take 2 tablets (650 mg total) by mouth every 6 (six) hours. 09/28/16   Henrene Dodge, MD  Buprenorphine HCl-Naloxone HCl (SUBOXONE SL) Place 6 mg under the tongue daily.    [provider]  CELEBREX 200 MG capsule Take 1 capsule by mouth daily. 09/17/16   [provider]  cyclobenzaprine (FLEXERIL) 10 MG tablet Take 1 tablet (10  mg total) by mouth 3 (three) times daily. 09/28/16   Henrene Dodge, MD  diclofenac (VOLTAREN) 50 MG EC tablet Take 1 tablet (50 mg total) by mouth 2 (two) times daily. 07/08/17 07/15/17  Little, Traci M, PA-C  Fexofenadine HCl (MUCINEX ALLERGY PO) Take by mouth as needed.    [provider]  HYDROcodone-acetaminophen (NORCO/VICODIN) 5-325 MG tablet Take 1 tablet by mouth every 4 (four) hours as needed for moderate pain. 11/01/16   Tommi Rumps, PA-C  hydrOXYzine (ATARAX/VISTARIL) 50 MG tablet Take 1 tablet by mouth at bedtime. 09/17/16   [provider]  ibuprofen (ADVIL,MOTRIN) 600 MG tablet Take 1 tablet (600 mg total) by mouth every 8 (eight) hours as needed for fever, mild pain or moderate pain. 09/28/16   Henrene Dodge, MD  oxyCODONE (OXY IR/ROXICODONE) 5 MG immediate release tablet Take 1 tablet (5 mg total) by mouth every 4 (four) hours as needed for breakthrough pain. 09/28/16   Henrene Dodge, MD  QUEtiapine (SEROQUEL) 50 MG tablet Take 1 tablet by mouth at bedtime. 09/17/16   [provider]  ranitidine (ZANTAC) 150 MG tablet Take 150 mg by mouth daily.    [provider]  sulfamethoxazole-trimethoprim (BACTRIM DS,SEPTRA DS) 800-160 MG tablet Take 1 tablet by mouth 2 (two) times daily. 11/01/16   Tommi Rumps, PA-C  topiramate (TOPAMAX) 50 MG tablet Take 1 tablet by mouth daily. 09/17/16   [provider]  traZODone (DESYREL) 150 MG tablet Take 1 tablet by mouth at bedtime. 09/17/16   [provider]  ZUBSOLV 5.7-1.4 MG  SUBL Take 1 tablet by mouth 3 (three) times daily. 09/17/16   [provider]    Allergies Clindamycin/lincomycin and Amoxicillin  Family History  Problem Relation Age of Onset  . Cancer Mother        skin    Social History Social History  Substance Use Topics  . Smoking status: Current Every Day Smoker    Packs/day: 1.00    Years: 13.00    Types: Cigarettes  . Smokeless tobacco: Never Used    . Alcohol use No    Review of Systems Constitutional: Negative for fever/chills Eyes: No visual changes. ENT:  Negative for sore throat and for difficulty swallowing Cardiovascular: Denies chest pain. Respiratory: Denies cough. Denies shortness of breath. Gastrointestinal: No abdominal pain.  No nausea, vomiting, diarrhea. Genitourinary: Negative for dysuria. Musculoskeletal: Positive for right knee pain and swelling. Skin: Negative for rash. Neurological: Negative for headaches.  Negative focal weakness or numbness. Negative for loss of consciousness. Able to ambulate. ____________________________________________   PHYSICAL EXAM:  VITAL SIGNS: Patient Vitals for the past 24 hrs:  BP Temp Temp src Pulse Resp SpO2 Height Weight  07/08/17 1942 124/79 98.4 F (36.9 C) Oral 78 16 98 % - -  07/08/17 1800 (!) 126/92 98.7 F (37.1 C) Oral 84 16 97 % - -  07/08/17 1755 - - - - - - 5\' 4"  (1.626 m) 86.2 kg (190 lb)   Constitutional: Alert and oriented. Well appearing and in no acute distress.   Eyes: Conjunctivae are normal. PERRL. EOMI  Head: Normocephalic and atraumatic. ENT:      Ears: Canals clear. TMs intact bilaterally.      Nose: No congestion/rhinnorhea.      Mouth/Throat: Mucous membranes are moist.  Neck:Supple. No thyromegaly. No stridor.  Cardiovascular: Normal rate, regular rhythm. Good peripheral circulation. Respiratory: Normal respiratory effort without tachypnea or retractions. Lungs CTAB. Good air entry to the bases with no decreased or absent breath sounds. Hematological/Lymphatic/Immunological: No cervical lymphadenopathy. Cardiovascular: Normal rate, regular rhythm. Normal distal pulses. Respiratory: Normal respiratory effort. No wheezes/rales/rhonchi. Lungs CTAB with no W/R/R. Gastrointestinal: Bowel sounds 4 quadrants. Soft and nontender to palpation. No guarding or rigidity. No palpable masses. No distention. No CVA tenderness. Musculoskeletal: Right knee  pain and swelling without traumatic injury. Full knee range of motion with intact strength and sensation. Tenderness along infrapatellar and suprapatellar bursa. No ligament laxity noted. Medial and lateral joint lines nontender. Nontender with normal range of motion in all extremities. Neurologic: Normal speech and language. No gross focal neurologic deficits are appreciated. No gait instability. Cranial nerves: II-X intact. No sensory loss or abnormal reflexes.  Skin:  Skin is warm, dry and intact. No rash noted. Psychiatric: Mood and affect are normal. Speech and behavior are normal. Patient exhibits appropriate insight and judgement.  ____________________________________________   LABS (all labs ordered are listed, but only abnormal results are displayed)  Labs Reviewed - No data to display ____________________________________________  EKG None ____________________________________________  RADIOLOGY DG right knee complete FINDINGS: Mild degenerative changes in the medial compartment. No joint effusion or fracture.  IMPRESSION: Mild degenerative changes in the medial compartment. ____________________________________________   PROCEDURES  Procedure(s) performed: no    Critical Care performed: no ____________________________________________   INITIAL IMPRESSION / ASSESSMENT AND PLAN / ED COURSE  Pertinent labs & imaging results that were available during my care of the patient were reviewed by me and considered in my medical decision making (see chart for details).  Patient presents to emergency department  with atraumatic right knee pain. 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 Patient reported decreased pain following Toradol administered during the course of care in the emergency department. Patient will be prescribed diclofenac for pain  and inflammation. Patient given knee immobilizer and crutches for mobility until symptoms improve. Patient advised to follow up with orthopedics as needed or return to the emergency department if symptoms return or worsen. Patient informed of clinical course, understand medical decision-making process, and agree with plan.  ____________________________________________   FINAL CLINICAL IMPRESSION(S) / ED DIAGNOSES  Final diagnoses:  Acute pain of right knee       NEW MEDICATIONS STARTED DURING THIS VISIT:  Discharge Medication List as of 07/08/2017  7:34 PM    START taking these medications   Details  diclofenac (VOLTAREN) 50 MG EC tablet Take 1 tablet (50 mg total) by mouth 2 (two) times daily., Starting Thu 07/08/2017, Until Thu 07/15/2017, Print         Note:  This document was prepared using Dragon voice recognition software and may include unintentional dictation errors.    Percell BostonLittle, Traci M, PA-C 07/08/17 2146    Jeanmarie PlantMcShane, James A, MD 07/08/17 2201

## 2017-07-08 NOTE — ED Triage Notes (Signed)
C/O right knee pain and swelling x 4 days.  Denies injury. States has tried multiple OTC meds, with no relief.

## 2017-08-23 ENCOUNTER — Emergency Department
Admission: EM | Admit: 2017-08-23 | Discharge: 2017-08-23 | Disposition: A | Discharge status: Home / Self Care | Payer: Medicaid Other | Attending: Emergency Medicine | Admitting: Emergency Medicine

## 2017-08-23 ENCOUNTER — Encounter: Payer: Self-pay | Admitting: Emergency Medicine

## 2017-08-23 DIAGNOSIS — L0201 Cutaneous abscess of face: Secondary | ICD-10-CM | POA: Diagnosis not present

## 2017-08-23 DIAGNOSIS — R220 Localized swelling, mass and lump, head: Secondary | ICD-10-CM | POA: Diagnosis present

## 2017-08-23 DIAGNOSIS — Z880 Allergy status to penicillin: Secondary | ICD-10-CM | POA: Diagnosis not present

## 2017-08-23 DIAGNOSIS — L0291 Cutaneous abscess, unspecified: Secondary | ICD-10-CM

## 2017-08-23 DIAGNOSIS — Z79899 Other long term (current) drug therapy: Secondary | ICD-10-CM | POA: Diagnosis not present

## 2017-08-23 DIAGNOSIS — Z883 Allergy status to other anti-infective agents status: Secondary | ICD-10-CM | POA: Diagnosis not present

## 2017-08-23 DIAGNOSIS — F17210 Nicotine dependence, cigarettes, uncomplicated: Secondary | ICD-10-CM | POA: Insufficient documentation

## 2017-08-23 MED ORDER — SULFAMETHOXAZOLE-TRIMETHOPRIM 800-160 MG PO TABS: 1.0000 | ORAL_TABLET | Freq: Two times a day (BID) | ORAL | 0 refills | Status: DC

## 2017-08-23 MED ORDER — ONDANSETRON 4 MG PO TBDP
4.0000 mg | ORAL_TABLET | Freq: Once | ORAL | Status: AC
  Administered 2017-08-23: 4 mg via ORAL
  Filled 2017-08-23: qty 1

## 2017-08-23 MED ORDER — LIDOCAINE HCL (PF) 1 % IJ SOLN
5.0000 mL | Freq: Once | INTRAMUSCULAR | Status: AC
  Administered 2017-08-23: 5 mL via INTRADERMAL
  Filled 2017-08-23: qty 5

## 2017-08-23 MED ORDER — LIDOCAINE-EPINEPHRINE-TETRACAINE (LET) SOLUTION
3.0000 mL | Freq: Once | NASAL | Status: AC
  Administered 2017-08-23: 3 mL via TOPICAL
  Filled 2017-08-23: qty 3

## 2017-08-23 NOTE — ED Triage Notes (Signed)
Pt presents with abscess to right lower chin that she says she woke up with 3 days ago; no drainage; redness, swelling and warmth noted to area;

## 2017-08-23 NOTE — ED Provider Notes (Signed)
[facility] Emergency Department Provider Note  ____________________________________________  Time seen: Approximately 7:20 AM  I have reviewed the triage vital signs and the nursing notes.   HISTORY  Chief Complaint Abscess    HPI [name] is a 31 y.o. female presents to emergency department with a tender area to chin for 3 days. Patient denies any drainage from area. She states that she had a fever last night of 101. She also feels nauseous. No alleviating measures have been attempted. She has never had this before. She is allergic to clindamycin and penicillin.     Past Medical History:  Diagnosis Date  . Headache   . Heroin abuse     Patient Active Problem List   Diagnosis Date Noted  . Symptomatic cholelithiasis   . Acute cholecystitis 09/22/2016    Past Surgical History:  Procedure Laterality Date  . ANKLE SURGERY Left 2012  . CESAREAN SECTION  2011  . CHOLECYSTECTOMY N/A 09/27/2016   Procedure: LAPAROSCOPIC CHOLECYSTECTOMY WITH INTRAOPERATIVE CHOLANGIOGRAM;  Surgeon: Lattie Haw, MD;  Location: [facility] ORS;  Service: General;  Laterality: N/A;  . DILATION AND CURETTAGE OF UTERUS  2013  . TONSILLECTOMY  2007    Prior to Admission medications   Medication Sig Start Date End Date Taking? Authorizing Provider  acetaminophen (TYLENOL) 325 MG tablet Take 2 tablets (650 mg total) by mouth every 6 (six) hours. 09/28/16   Henrene Dodge, MD  Buprenorphine HCl-Naloxone HCl (SUBOXONE SL) Place 6 mg under the tongue daily.    [provider]  CELEBREX 200 MG capsule Take 1 capsule by mouth daily. 09/17/16   [provider]  cyclobenzaprine (FLEXERIL) 10 MG tablet Take 1 tablet (10 mg total) by mouth 3 (three) times daily. 09/28/16   Piscoya, Elita Quick, MD  Fexofenadine HCl (MUCINEX ALLERGY PO) Take by mouth as needed.    [provider]  HYDROcodone-acetaminophen (NORCO/VICODIN) 5-325 MG tablet Take 1 tablet by mouth  every 4 (four) hours as needed for moderate pain. 11/01/16   Tommi Rumps, PA-C  hydrOXYzine (ATARAX/VISTARIL) 50 MG tablet Take 1 tablet by mouth at bedtime. 09/17/16   [provider]  ibuprofen (ADVIL,MOTRIN) 600 MG tablet Take 1 tablet (600 mg total) by mouth every 8 (eight) hours as needed for fever, mild pain or moderate pain. 09/28/16   Henrene Dodge, MD  oxyCODONE (OXY IR/ROXICODONE) 5 MG immediate release tablet Take 1 tablet (5 mg total) by mouth every 4 (four) hours as needed for breakthrough pain. 09/28/16   Henrene Dodge, MD  QUEtiapine (SEROQUEL) 50 MG tablet Take 1 tablet by mouth at bedtime. 09/17/16   [provider]  ranitidine (ZANTAC) 150 MG tablet Take 150 mg by mouth daily.    [provider]  sulfamethoxazole-trimethoprim (BACTRIM DS,SEPTRA DS) 800-160 MG tablet Take 1 tablet by mouth 2 (two) times daily. 08/23/17   Enid Derry, PA-C  topiramate (TOPAMAX) 50 MG tablet Take 1 tablet by mouth daily. 09/17/16   [provider]  traZODone (DESYREL) 150 MG tablet Take 1 tablet by mouth at bedtime. 09/17/16   [provider]  ZUBSOLV 5.7-1.4 MG SUBL Take 1 tablet by mouth 3 (three) times daily. 09/17/16   [provider]    Allergies Clindamycin/lincomycin and Amoxicillin  Family History  Problem Relation Age of Onset  . Cancer Mother        skin    Social History Social History  Substance Use Topics  . Smoking status: Current Every Day  Smoker    Packs/day: 1.00    Years: 13.00    Types: Cigarettes  . Smokeless tobacco: Never Used  . Alcohol use No     Review of Systems  Cardiovascular: No chest pain. Respiratory:  No SOB. Gastrointestinal: No abdominal pain.  No vomiting.  Musculoskeletal: Negative for musculoskeletal pain. Skin: Negative for abrasions, lacerations, ecchymosis.   ____________________________________________   PHYSICAL EXAM:  VITAL SIGNS: ED Triage Vitals [08/23/17 0654]  Enc  Vitals Group     BP 129/84     Pulse Rate 65     Resp 18     Temp 97.7 F (36.5 C)     Temp Source Oral     SpO2 97 %     Weight 200 lb (90.7 kg)     Height  (1.626 m)     Head Circumference      Peak Flow      Pain Score 8     Pain Loc      Pain Edu?      Excl. in GC?      Constitutional: Alert and oriented. Well appearing and in no acute distress. Eyes: Conjunctivae are normal. PERRL. EOMI. Head: Atraumatic. ENT:      Ears:      Nose: No congestion/rhinnorhea.      Mouth/Throat: Mucous membranes are moist.  Neck: No stridor.   Cardiovascular: Normal rate, regular rhythm.  Good peripheral circulation. Respiratory: Normal respiratory effort without tachypnea or retractions. Lungs CTAB. Good air entry to the bases with no decreased or absent breath sounds. Musculoskeletal: Full range of motion to all extremities. No gross deformities appreciated. Neurologic:  Normal speech and language. No gross focal neurologic deficits are appreciated.  Skin:  Skin is warm, dry. 1 cm x 1 cm area of swelling and fluctuance to chin. Extremely tender to palpation. No drainage   ____________________________________________   LABS (all labs ordered are listed, but only abnormal results are displayed)  Labs Reviewed - No data to display ____________________________________________  EKG   ____________________________________________  RADIOLOGY  No results found.  ____________________________________________    PROCEDURES  Procedure(s) performed:    Procedures  INCISION AND DRAINAGE Performed by: Enid Derry Consent: Verbal consent obtained. Risks and benefits: risks, benefits and alternatives were discussed Type: abscess  Body area: Chin  Anesthesia: local infiltration  Incision was made with a scalpel.  Local anesthetic: lidocaine 1% without epinephrine  Anesthetic total: 2 ml  Complexity: complex Blunt dissection to break up loculations  Drainage:  purulent  Drainage amount: 1 cc  Packing material: none  Patient tolerance: Patient tolerated the procedure well with no immediate complications.    Medications  lidocaine-EPINEPHrine-tetracaine (LET) solution (3 mLs Topical Given 08/23/17 0734)  lidocaine (PF) (XYLOCAINE) 1 % injection 5 mL (5 mLs Intradermal Given 08/23/17 0823)  ondansetron (ZOFRAN-ODT) disintegrating tablet 4 mg (4 mg Oral Given 08/23/17 0842)     ____________________________________________   INITIAL IMPRESSION / ASSESSMENT AND PLAN / ED COURSE  Pertinent labs & imaging results that were available during my care of the patient were reviewed by me and considered in my medical decision making (see chart for details).  Review of the [place] CSRS was performed in accordance of the [facility] prior to dispensing any controlled drugs.   Patient's diagnosis is consistent with abscess. Vital signs and exam are reassuring. She is afebrile. Abscess was drained in ED. She felt nauseous so she was given zofran. Patient does not want Tylenol or ibuprofen for  pain. Patient will be discharged home with prescriptions for Bactrim. Patient is to follow up with PCP as directed. Patient is given ED precautions to return to the ED for any worsening or new symptoms.     ____________________________________________  FINAL CLINICAL IMPRESSION(S) / ED DIAGNOSES  Final diagnoses:  Abscess      NEW MEDICATIONS STARTED DURING THIS VISIT:  Discharge Medication List as of 08/23/2017  8:43 AM          This chart was dictated using voice recognition software/Dragon. Despite best efforts to proofread, errors can occur which can change the meaning. Any change was purely unintentional.    Enid Derry, PA-C 08/23/17 0849    Emily Filbert, MD 08/23/17 [phone]

## 2017-08-23 NOTE — ED Notes (Signed)
See triage note  States she developed a small abscess area to chin about 3-4 days ago   States she was using warm compresses to chin

## 2017-09-25 ENCOUNTER — Emergency Department
Admission: EM | Admit: 2017-09-25 | Discharge: 2017-09-25 | Disposition: A | Discharge status: Home / Self Care | Payer: Medicaid Other | Attending: Emergency Medicine | Admitting: Emergency Medicine

## 2017-09-25 DIAGNOSIS — Z79899 Other long term (current) drug therapy: Secondary | ICD-10-CM | POA: Diagnosis not present

## 2017-09-25 DIAGNOSIS — Z9049 Acquired absence of other specified parts of digestive tract: Secondary | ICD-10-CM | POA: Diagnosis not present

## 2017-09-25 DIAGNOSIS — L03317 Cellulitis of buttock: Secondary | ICD-10-CM | POA: Insufficient documentation

## 2017-09-25 DIAGNOSIS — F17210 Nicotine dependence, cigarettes, uncomplicated: Secondary | ICD-10-CM | POA: Insufficient documentation

## 2017-09-25 DIAGNOSIS — F1110 Opioid abuse, uncomplicated: Secondary | ICD-10-CM | POA: Insufficient documentation

## 2017-09-25 DIAGNOSIS — L0291 Cutaneous abscess, unspecified: Secondary | ICD-10-CM

## 2017-09-25 DIAGNOSIS — R21 Rash and other nonspecific skin eruption: Secondary | ICD-10-CM | POA: Diagnosis present

## 2017-09-25 DIAGNOSIS — L0231 Cutaneous abscess of buttock: Secondary | ICD-10-CM | POA: Insufficient documentation

## 2017-09-25 LAB — CBC WITH DIFFERENTIAL/PLATELET
BASOS PCT: 1 %
Basophils Absolute: 0.1 10*3/uL (ref 0–0.1)
EOS ABS: 0.1 10*3/uL (ref 0–0.7)
Eosinophils Relative: 1 %
HCT: 37.4 % (ref 35.0–47.0)
HEMOGLOBIN: 12.6 g/dL (ref 12.0–16.0)
LYMPHS ABS: 2.6 10*3/uL (ref 1.0–3.6)
Lymphocytes Relative: 17 %
MCH: 28.7 pg (ref 26.0–34.0)
MCHC: 33.8 g/dL (ref 32.0–36.0)
MCV: 84.8 fL (ref 80.0–100.0)
Monocytes Absolute: 1.3 10*3/uL — ABNORMAL HIGH (ref 0.2–0.9)
Monocytes Relative: 9 %
NEUTROS ABS: 11.1 10*3/uL — AB (ref 1.4–6.5)
NEUTROS PCT: 72 %
Platelets: 271 10*3/uL (ref 150–440)
RBC: 4.4 MIL/uL (ref 3.80–5.20)
RDW: 14.4 % (ref 11.5–14.5)
WBC: 15.1 10*3/uL — ABNORMAL HIGH (ref 3.6–11.0)

## 2017-09-25 LAB — BASIC METABOLIC PANEL
ANION GAP: 12 (ref 5–15)
BUN: 9 mg/dL (ref 6–20)
CHLORIDE: 99 mmol/L — AB (ref 101–111)
CO2: 24 mmol/L (ref 22–32)
CREATININE: 0.77 mg/dL (ref 0.44–1.00)
Calcium: 9.1 mg/dL (ref 8.9–10.3)
GFR calc non Af Amer: >60 mL/min (ref >60)
Glucose, Bld: 102 mg/dL — ABNORMAL HIGH (ref 65–99)
Potassium: 3.5 mmol/L (ref 3.5–5.1)
Sodium: 135 mmol/L (ref 135–145)

## 2017-09-25 MED ORDER — IBUPROFEN 600 MG PO TABS
600.0000 mg | ORAL_TABLET | Freq: Once | ORAL | Status: AC
  Administered 2017-09-25: 600 mg via ORAL
  Filled 2017-09-25: qty 1

## 2017-09-25 MED ORDER — HYDROCODONE-ACETAMINOPHEN 5-325 MG PO TABS: 1.0000 | ORAL_TABLET | ORAL | 0 refills | Status: DC | PRN

## 2017-09-25 MED ORDER — DOXYCYCLINE HYCLATE 100 MG PO TABS
100.0000 mg | ORAL_TABLET | Freq: Two times a day (BID) | ORAL | Status: DC
  Administered 2017-09-25: 100 mg via ORAL
  Filled 2017-09-25: qty 1

## 2017-09-25 MED ORDER — IBUPROFEN 800 MG PO TABS: 800.0000 mg | ORAL_TABLET | Freq: Three times a day (TID) | ORAL | 0 refills | Status: DC | PRN

## 2017-09-25 MED ORDER — DOXYCYCLINE HYCLATE 50 MG PO CAPS: 100.0000 mg | ORAL_CAPSULE | Freq: Two times a day (BID) | ORAL | 0 refills | Status: DC

## 2017-09-25 MED ORDER — MORPHINE SULFATE (PF) 4 MG/ML IV SOLN
4.0000 mg | Freq: Once | INTRAVENOUS | Status: AC
  Administered 2017-09-25: 4 mg via INTRAVENOUS
  Filled 2017-09-25: qty 1

## 2017-09-25 MED ORDER — ONDANSETRON HCL 4 MG/2ML IJ SOLN
4.0000 mg | Freq: Once | INTRAMUSCULAR | Status: AC
  Administered 2017-09-25: 4 mg via INTRAVENOUS
  Filled 2017-09-25: qty 2

## 2017-09-25 MED ORDER — LIDOCAINE HCL (PF) 1 % IJ SOLN
5.0000 mL | Freq: Once | INTRAMUSCULAR | Status: AC
  Administered 2017-09-25: 5 mL via INTRADERMAL
  Filled 2017-09-25: qty 5

## 2017-09-25 NOTE — Discharge Instructions (Signed)
You are being treated for a skin abscess and surrounding infection called cellulitis with antibiotic doxycycline.  Please do warm soaks to the abscess area twice daily.  Return to the emergency department immediately for any worsening or uncontrolled pain, worsening swelling, no worsening fevers, vomiting, or any other symptoms concerning to you.  You will need recheck early in the week, please call [facility] to make a follow up appointment.

## 2017-09-25 NOTE — ED Notes (Signed)
Dr. Lord at bedside.  

## 2017-09-25 NOTE — ED Triage Notes (Signed)
Patient to ER for c/o abscess to right buttocks/groin with fever of 103 yesterday. States fever was 100.7 this am. Has had chills and generalized aches as well. States abscess started approx 5 days ago, but was "small and didn't bother me". States "it has a gaping hole" when describing abscess". States it is painful to walk d/t location of abscess.

## 2017-09-25 NOTE — ED Provider Notes (Signed)
[facility] Emergency Department Provider Note ____________________________________________   I have reviewed the triage vital signs and the triage nursing note.  HISTORY  Chief Complaint Abscess and Fever   Historian Patient  HPI Ariana BarcelonaMegan A Rubye OaksDickerson is a 31 y.o. female with a history of prior abscess, once needing Micah NoelLance, states small bump on her right buttock started a few days ago and instead of this appearing like previous ones has this 1 has grown larger and more painful.  It did start draining, however overnight the area has gotten more tender and larger despite the fact that straining.  She did have a fever yesterday.  She has body aches.  No vomiting.  Symptoms are moderate.  Movement makes it worse.   Past Medical History:  Diagnosis Date  . Headache   . Heroin abuse     Patient Active Problem List   Diagnosis Date Noted  . Symptomatic cholelithiasis   . Acute cholecystitis 09/22/2016    Past Surgical History:  Procedure Laterality Date  . ANKLE SURGERY Left 2012  . CESAREAN SECTION  2011  . CHOLECYSTECTOMY N/A 09/27/2016   Procedure: LAPAROSCOPIC CHOLECYSTECTOMY WITH INTRAOPERATIVE CHOLANGIOGRAM;  Surgeon: Lattie Hawichard E Cooper, MD;  Location: [facility] ORS;  Service: General;  Laterality: N/A;  . DILATION AND CURETTAGE OF UTERUS  2013  . TONSILLECTOMY  2007    Prior to Admission medications   Medication Sig Start Date End Date Taking? Authorizing Provider  acetaminophen (TYLENOL) 325 MG tablet Take 2 tablets (650 mg total) by mouth every 6 (six) hours. 09/28/16   Henrene DodgePiscoya, Jose, MD  Buprenorphine HCl-Naloxone HCl (SUBOXONE SL) Place 6 mg under the tongue daily.    [provider]  CELEBREX 200 MG capsule Take 1 capsule by mouth daily. 09/17/16   [provider]  cyclobenzaprine (FLEXERIL) 10 MG tablet Take 1 tablet (10 mg total) by mouth 3 (three) times daily. 09/28/16   Henrene DodgePiscoya, Jose, MD  doxycycline (VIBRAMYCIN) 50 MG capsule  Take 2 capsules (100 mg total) by mouth 2 (two) times daily. 09/25/17   Governor RooksLord, Devonne Lalani, MD  Fexofenadine HCl [facility](MUCINEX ALLERGY PO) Take by mouth as needed.    [provider]  HYDROcodone-acetaminophen (NORCO/VICODIN) 5-325 MG tablet Take 1 tablet by mouth every 4 (four) hours as needed for moderate pain. 09/25/17   Governor RooksLord, Brylee Berk, MD  hydrOXYzine (ATARAX/VISTARIL) 50 MG tablet Take 1 tablet by mouth at bedtime. 09/17/16   [provider]  ibuprofen (ADVIL,MOTRIN) 800 MG tablet Take 1 tablet (800 mg total) by mouth every 8 (eight) hours as needed. 09/25/17   Governor RooksLord, Randal Goens, MD  oxyCODONE (OXY IR/ROXICODONE) 5 MG immediate release tablet Take 1 tablet (5 mg total) by mouth every 4 (four) hours as needed for breakthrough pain. 09/28/16   Henrene DodgePiscoya, Jose, MD  QUEtiapine (SEROQUEL) 50 MG tablet Take 1 tablet by mouth at bedtime. 09/17/16   [provider]  ranitidine (ZANTAC) 150 MG tablet Take 150 mg by mouth daily.    [provider]  sulfamethoxazole-trimethoprim (BACTRIM DS,SEPTRA DS) 800-160 MG tablet Take 1 tablet by mouth 2 (two) times daily. 08/23/17   Enid DerryWagner, Ashley, PA-C  topiramate (TOPAMAX) 50 MG tablet Take 1 tablet by mouth daily. 09/17/16   [provider]  traZODone (DESYREL) 150 MG tablet Take 1 tablet by mouth at bedtime. 09/17/16   [provider]  ZUBSOLV 5.7-1.4 MG SUBL Take 1 tablet by mouth 3 (three) times daily. 09/17/16   [provider]    Allergies  Allergen Reactions  . Clindamycin/Lincomycin Anaphylaxis and Rash  . Amoxicillin Rash    Family History  Problem Relation Age of Onset  . Cancer Mother        skin    Social History Social History  Substance Use Topics  . Smoking status: Current Every Day Smoker    Packs/day: 1.00    Years: 13.00    Types: Cigarettes  . Smokeless tobacco: Never Used  . Alcohol use No    Review of Systems  Constitutional: Positive for fever. Eyes: Negative for visual  changes. ENT: Negative for sore throat. Cardiovascular: Negative for chest pain. Respiratory: Negative for shortness of breath. Gastrointestinal: Negative for abdominal pain, vomiting and diarrhea. Genitourinary: Negative for dysuria. Musculoskeletal: Negative for back pain. Skin: Positive for redness and abscess at the right buttock. Neurological: Negative for headache.  ____________________________________________   PHYSICAL EXAM:  VITAL SIGNS: ED Triage Vitals [09/25/17 0845]  Enc Vitals Group     BP 128/81     Pulse Rate (!) 118     Resp 20     Temp 98.3 F (36.8 C)     Temp Source Oral     SpO2 95 %     Weight 200 lb (90.7 kg)     Height 5\' 4"  (1.626 m)     Head Circumference      Peak Flow      Pain Score 10     Pain Loc      Pain Edu?      Excl. in GC?      Constitutional: Alert and oriented. Well appearing and in no distress.  She is however in pain. HEENT   Head: Normocephalic and atraumatic.      Eyes: Conjunctivae are normal. Pupils equal and round.       Ears:         Nose: No congestion/rhinnorhea.   Mouth/Throat: Mucous membranes are moist.   Neck: No stridor. Cardiovascular/Chest: Normal rate, regular rhythm.  No murmurs, rubs, or gallops. Respiratory: Normal respiratory effort without tachypnea nor retractions. Breath sounds are clear and equal bilaterally. No wheezes/rales/rhonchi. Gastrointestinal: Soft. No distention, no guarding, no rebound. Nontender.    Genitourinary/rectal: Small draining area at the edge of the gluteus and the thigh with surrounding erythema and induration with some pus drainage with mild expression.  Some streaking into the groin medially without any apparent connection to the rectal area. Musculoskeletal: Nontender with normal range of motion in all extremities. No joint effusions.  No lower extremity tenderness.  No edema. Neurologic:  Normal speech and language. No gross or focal neurologic deficits are  appreciated. Skin:  Skin is warm, dry and intact. No rash noted. Psychiatric: Mood and affect are normal. Speech and behavior are normal. Patient exhibits appropriate insight and judgment.   ____________________________________________  LABS (pertinent positives/negatives) I, Governor Rooks, MD the attending physician have reviewed the labs noted below.  Labs Reviewed  BASIC METABOLIC PANEL - Abnormal; Notable for the following:       Result Value   Chloride 99 (*)    Glucose, Bld 102 (*)    All other components within normal limits  CBC WITH DIFFERENTIAL/PLATELET - Abnormal; Notable for the following:    WBC 15.1 (*)    Neutro Abs 11.1 (*)    Monocytes Absolute 1.3 (*)    All other components within normal limits  CULTURE, BLOOD (ROUTINE X 2)  CULTURE, BLOOD (ROUTINE X 2)    ____________________________________________  EKG I, Governor Rooks, MD, the attending physician have personally viewed and interpreted all ECGs.  None ____________________________________________  RADIOLOGY All Xrays were viewed by me.  Imaging interpreted by Radiologist, and I, Governor Rooks, MD the attending physician have reviewed the radiologist interpretation noted below.  None __________________________________________  PROCEDURES  Procedure(s) performed: INCISION AND DRAINAGE Performed by: Governor Rooks Consent: Verbal consent obtained. Risks and benefits: risks, benefits and alternatives were discussed Type: abscess  Body area: left buttock  Anesthesia: local infiltration  Incision was made with a scalpel.  Local anesthetic: lidocaine 1% no epinephrine  Anesthetic total: 3 ml  Complexity: complex Blunt dissection to break up loculations  Drainage: purulent  Drainage amount: small  Packing material: 1/4 in iodoform gauze  Patient tolerance: Patient tolerated the procedure well with no immediate complications.     Critical Care performed:  None  ____________________________________________  No current facility-administered medications on file prior to encounter.    Current Outpatient Prescriptions on File Prior to Encounter  Medication Sig Dispense Refill  . acetaminophen (TYLENOL) 325 MG tablet Take 2 tablets (650 mg total) by mouth every 6 (six) hours. 30 tablet 0  . Buprenorphine HCl-Naloxone HCl (SUBOXONE SL) Place 6 mg under the tongue daily.    . CELEBREX 200 MG capsule Take 1 capsule by mouth daily.  0  . cyclobenzaprine (FLEXERIL) 10 MG tablet Take 1 tablet (10 mg total) by mouth 3 (three) times daily. 30 tablet 0  . Fexofenadine HCl (MUCINEX ALLERGY PO) Take by mouth as needed.    . hydrOXYzine (ATARAX/VISTARIL) 50 MG tablet Take 1 tablet by mouth at bedtime.  0  . oxyCODONE (OXY IR/ROXICODONE) 5 MG immediate release tablet Take 1 tablet (5 mg total) by mouth every 4 (four) hours as needed for breakthrough pain. 15 tablet 0  . QUEtiapine (SEROQUEL) 50 MG tablet Take 1 tablet by mouth at bedtime.  0  . ranitidine (ZANTAC) 150 MG tablet Take 150 mg by mouth daily.    Marland Kitchen sulfamethoxazole-trimethoprim (BACTRIM DS,SEPTRA DS) 800-160 MG tablet Take 1 tablet by mouth 2 (two) times daily. 20 tablet 0  . topiramate (TOPAMAX) 50 MG tablet Take 1 tablet by mouth daily.  0  . traZODone (DESYREL) 150 MG tablet Take 1 tablet by mouth at bedtime.  0  . ZUBSOLV 5.7-1.4 MG SUBL Take 1 tablet by mouth 3 (three) times daily.  0    ____________________________________________  ED COURSE / ASSESSMENT AND PLAN  Pertinent labs & imaging results that were available during my care of the patient were reviewed by me and considered in my medical decision making (see chart for details).   Although patient is overall well appearing, she has reported fever to 103 yesterday and febrile here with this fairly large abscess.  The abscess appears to be localized to soft tissues and not associated with the rectum.  We will send blood cultures and  labs as a baseline, and patient will be treated with doxycycline.  She reports an allergy to clindamycin.  Although the area is draining itself, I am going to open it slightly larger given the fact that the patient reports worsening overnight, and there is still some fluctuance that I am able to express.  Will recommend manage pain with ibuprofen and just 4 tablets norco for 24 hours.  I reviewed narcotics database, last narcotic Rx was in 2017.  Patient does have a history of prior heroin abuse and then buprenorphine but she has been clean of heroin for 5  years and has weaned off of supportive medications.    White blood cell count elevated, patient is overall well-appearing and I think not showing signs of sepsis at this point.  Blood cultures have been sent.  I discussed with patient the need to return immediately for any worsening condition.   CONSULTATIONS:   None   Patient / Family / Caregiver informed of clinical course, medical decision-making process, and agree with plan.   I discussed return precautions, follow-up instructions, and discharge instructions with patient and/or family.  Discharge Instructions : You are being treated for a skin abscess and surrounding infection called cellulitis with antibiotic doxycycline.  Please do warm soaks to the abscess area twice daily.  Return to the emergency department immediately for any worsening or uncontrolled pain, worsening swelling, no worsening fevers, vomiting, or any other symptoms concerning to you.  You will need recheck early in the week, please call [facility] to make a follow up appointment.  ___________________________________________   FINAL CLINICAL IMPRESSION(S) / ED DIAGNOSES   Final diagnoses:  Abscess  Cellulitis and abscess of buttock              Note: This dictation was prepared with Dragon dictation. Any transcriptional errors that result from this process are unintentional    Governor Rooks, MD 09/25/17 1031

## 2017-09-30 LAB — CULTURE, BLOOD (ROUTINE X 2)
CULTURE: NO GROWTH
Culture: NO GROWTH
Special Requests: ADEQUATE

## 2017-12-07 ENCOUNTER — Emergency Department
Admission: EM | Admit: 2017-12-07 | Discharge: 2017-12-07 | Disposition: A | Discharge status: Home / Self Care | Payer: Medicaid Other | Attending: Emergency Medicine | Admitting: Emergency Medicine

## 2017-12-07 ENCOUNTER — Encounter: Payer: Self-pay | Admitting: Emergency Medicine

## 2017-12-07 DIAGNOSIS — L0291 Cutaneous abscess, unspecified: Secondary | ICD-10-CM

## 2017-12-07 DIAGNOSIS — L02211 Cutaneous abscess of abdominal wall: Secondary | ICD-10-CM | POA: Diagnosis not present

## 2017-12-07 DIAGNOSIS — F17210 Nicotine dependence, cigarettes, uncomplicated: Secondary | ICD-10-CM | POA: Insufficient documentation

## 2017-12-07 DIAGNOSIS — Z79899 Other long term (current) drug therapy: Secondary | ICD-10-CM | POA: Insufficient documentation

## 2017-12-07 DIAGNOSIS — Z3A12 12 weeks gestation of pregnancy: Secondary | ICD-10-CM | POA: Insufficient documentation

## 2017-12-07 DIAGNOSIS — O99711 Diseases of the skin and subcutaneous tissue complicating pregnancy, first trimester: Secondary | ICD-10-CM | POA: Insufficient documentation

## 2017-12-07 MED ORDER — CEPHALEXIN 500 MG PO CAPS: 500.0000 mg | ORAL_CAPSULE | Freq: Three times a day (TID) | ORAL | 0 refills | Status: DC

## 2017-12-07 NOTE — ED Provider Notes (Signed)
[facility] Emergency Department Provider Note   ____________________________________________   First MD Initiated Contact with Patient 12/07/17 1255     (approximate)  I have reviewed the triage vital signs and the nursing notes.   HISTORY  Chief Complaint Abscess   HPI [name] is a 32 y.o. female he is here with an abscess to her left lower abdomen for approximately one week. Patient states it started out as a small bite that continued to itch. She states that she scratched at it several times. Area is red and scabbed over. Earlier in the week she did squeeze some yellow/green drainage from the area. Patient is also [redacted] weeks pregnant. He denies any complications with her pregnancy thus far.   Past Medical History:  Diagnosis Date  . Headache   . Heroin abuse [facility])     Patient Active Problem List   Diagnosis Date Noted  . Symptomatic cholelithiasis   . Acute cholecystitis 09/22/2016    Past Surgical History:  Procedure Laterality Date  . ANKLE SURGERY Left 2012  . CESAREAN SECTION  2011  . CHOLECYSTECTOMY N/A 09/27/2016   Procedure: LAPAROSCOPIC CHOLECYSTECTOMY WITH INTRAOPERATIVE CHOLANGIOGRAM;  Surgeon: Lattie Haw, MD;  Location: [facility] ORS;  Service: General;  Laterality: N/A;  . DILATION AND CURETTAGE OF UTERUS  2013  . TONSILLECTOMY  2007    Prior to Admission medications   Medication Sig Start Date End Date Taking? Authorizing Provider  acetaminophen (TYLENOL) 325 MG tablet Take 2 tablets (650 mg total) by mouth every 6 (six) hours. 09/28/16   Henrene Dodge, MD  Buprenorphine HCl-Naloxone HCl (SUBOXONE SL) Place 6 mg under the tongue daily.    [provider]  CELEBREX 200 MG capsule Take 1 capsule by mouth daily. 09/17/16   [provider]  cephALEXin (KEFLEX) 500 MG capsule Take 1 capsule (500 mg total) by mouth 3 (three) times daily. 12/07/17   Tommi Rumps, PA-C  cyclobenzaprine (FLEXERIL) 10 MG  tablet Take 1 tablet (10 mg total) by mouth 3 (three) times daily. 09/28/16   Henrene Dodge, MD  doxycycline (VIBRAMYCIN) 50 MG capsule Take 2 capsules (100 mg total) by mouth 2 (two) times daily. 09/25/17   Governor Rooks, MD  Fexofenadine HCl [facility] ALLERGY PO) Take by mouth as needed.    [provider]  HYDROcodone-acetaminophen (NORCO/VICODIN) 5-325 MG tablet Take 1 tablet by mouth every 4 (four) hours as needed for moderate pain. 09/25/17   Governor Rooks, MD  hydrOXYzine (ATARAX/VISTARIL) 50 MG tablet Take 1 tablet by mouth at bedtime. 09/17/16   [provider]  ibuprofen (ADVIL,MOTRIN) 800 MG tablet Take 1 tablet (800 mg total) by mouth every 8 (eight) hours as needed. 09/25/17   Governor Rooks, MD  oxyCODONE (OXY IR/ROXICODONE) 5 MG immediate release tablet Take 1 tablet (5 mg total) by mouth every 4 (four) hours as needed for breakthrough pain. 09/28/16   Henrene Dodge, MD  QUEtiapine (SEROQUEL) 50 MG tablet Take 1 tablet by mouth at bedtime. 09/17/16   [provider]  ranitidine (ZANTAC) 150 MG tablet Take 150 mg by mouth daily.    [provider]  sulfamethoxazole-trimethoprim (BACTRIM DS,SEPTRA DS) 800-160 MG tablet Take 1 tablet by mouth 2 (two) times daily. 08/23/17   Enid Derry, PA-C  topiramate (TOPAMAX) 50 MG tablet Take 1 tablet by mouth daily. 09/17/16   [provider]  traZODone (DESYREL) 150 MG tablet Take 1 tablet by mouth at bedtime. 09/17/16   [provider]  ZUBSOLV 5.7-1.4 MG SUBL Take 1 tablet by mouth 3 (three) times daily. 09/17/16   [provider]    Allergies Clindamycin/lincomycin and Amoxicillin  Family History  Problem Relation Age of Onset  . Cancer Mother        skin    Social History Social History   Tobacco Use  . Smoking status: Current Every Day Smoker    Packs/day: 0.50    Years: 13.00    Pack years: 6.50    Types: Cigarettes  . Smokeless tobacco: Never Used  Substance Use  Topics  . Alcohol use: No  . Drug use: No    Review of Systems Constitutional: No fever/chills Cardiovascular: Denies chest pain. Respiratory: Denies shortness of breath. Gastrointestinal: No abdominal pain.  No nausea, no vomiting.  Musculoskeletal: Negative for back pain. Skin: positive for abscess. Neurological: Negative for focal weakness or numbness. ___________________________________________   PHYSICAL EXAM:  VITAL SIGNS: ED Triage Vitals  Enc Vitals Group     BP 12/07/17 1215 128/83     Pulse Rate 12/07/17 1215 (!) 101     Resp 12/07/17 1215 18     Temp 12/07/17 1215 97.9 F (36.6 C)     Temp Source 12/07/17 1215 Oral     SpO2 12/07/17 1215 98 %     Weight 12/07/17 1216 210 lb (95.3 kg)     Height 12/07/17 1216 5\' 4"  (1.626 m)     Head Circumference --      Peak Flow --      Pain Score 12/07/17 1222 7     Pain Loc --      Pain Edu? --      Excl. in GC? --    Constitutional: Alert and oriented. Well appearing and in no acute distress. Eyes: Conjunctivae are normal.  Head: Atraumatic. Neck: No stridor.   Cardiovascular:  Good peripheral circulation. Respiratory: Normal respiratory effort.   Musculoskeletal: moves upper and lower extremities faint difficult. Normal gait was noted. Neurologic:  Normal speech and language. No gross focal neurologic deficits are appreciated. No gait instability. Skin:  Skin is warm, dry.  Left lower abdomen with very tender, firm, erythematous nodule measuring approximately 2 cm in diameter. No active drainage. Psychiatric: Mood and affect are normal. Speech and behavior are normal.  ____________________________________________   LABS (all labs ordered are listed, but only abnormal results are displayed)  Labs Reviewed - No data to display   PROCEDURES  Procedure(s) performed: None  Procedures  Critical Care performed: No  ____________________________________________   INITIAL IMPRESSION / ASSESSMENT AND PLAN / ED  COURSE Patient states that she has taken Keflex in the past without any difficulty. We discussed the possibility that this would not cover MRSA. She will begin using warm compress frequently and taking antibiotic. She'll follow-up with her OB/GYN or Dr. Earlene Plateravis who is on call for surgery if any continued problems. She was told to return to the emergency room if any temp over 101.  ____________________________________________   FINAL CLINICAL IMPRESSION(S) / ED DIAGNOSES  Final diagnoses:  Abscess     ED Discharge Orders        Ordered    cephALEXin (KEFLEX) 500 MG capsule  3 times daily     12/07/17 1307       Note:  This document was prepared using Dragon voice recognition software and may include unintentional dictation errors.    Tommi RumpsSummers, Rhonda L, PA-C 12/07/17 1320    Minna AntisPaduchowski, Kevin, MD  12/07/17 1408  

## 2017-12-07 NOTE — ED Notes (Signed)
Pt reports that she has an abscess on the left lower part of her abd - area present for 1 week - area is red/swollen/draining - center of area is black in color

## 2017-12-07 NOTE — ED Triage Notes (Signed)
Pt in via POV with complaints of abscess x one week to left lower abdomen, pt reports area started as a bite, continued to scratch area.  Area red, scabbed over.  Pt reports green exudate.  Pt afebrile, NAD noted at this time.

## 2017-12-07 NOTE — Discharge Instructions (Signed)
Follow-up with your  OB/GYN or Dr. Earlene Plateravis who is the surgeon on-call if any continued problems. Begin taking Keflex 500 mg 3 times a day for 10 days. Use warm moist compresses to the area frequently. Return to the emergency room if any fever over 101. You  may take Tylenol if needed for pain sparingly.

## 2017-12-14 DIAGNOSIS — F1991 Other psychoactive substance use, unspecified, in remission: Secondary | ICD-10-CM | POA: Insufficient documentation

## 2017-12-14 DIAGNOSIS — Z87898 Personal history of other specified conditions: Secondary | ICD-10-CM | POA: Insufficient documentation

## 2017-12-14 LAB — OB RESULTS CONSOLE GC/CHLAMYDIA
Chlamydia: NEGATIVE
GC PROBE AMP, GENITAL: NEGATIVE

## 2017-12-15 DIAGNOSIS — F1991 Other psychoactive substance use, unspecified, in remission: Secondary | ICD-10-CM

## 2017-12-15 HISTORY — DX: Other psychoactive substance use, unspecified, in remission: F19.91

## 2017-12-16 LAB — OB RESULTS CONSOLE HEPATITIS B SURFACE ANTIGEN: Hepatitis B Surface Ag: NEGATIVE

## 2017-12-16 LAB — OB RESULTS CONSOLE RPR: RPR: NONREACTIVE

## 2017-12-16 LAB — OB RESULTS CONSOLE HIV ANTIBODY (ROUTINE TESTING): HIV: NONREACTIVE

## 2017-12-16 LAB — OB RESULTS CONSOLE RUBELLA ANTIBODY, IGM: RUBELLA: IMMUNE

## 2017-12-16 LAB — OB RESULTS CONSOLE VARICELLA ZOSTER ANTIBODY, IGG: Varicella: IMMUNE

## 2018-02-17 ENCOUNTER — Encounter: Payer: Self-pay | Admitting: *Deleted

## 2018-02-17 ENCOUNTER — Encounter: Payer: Medicaid Other | Attending: Obstetrics and Gynecology | Admitting: *Deleted

## 2018-02-17 VITALS — BP 110/60 | Ht 64.0 in | Wt 211.9 lb

## 2018-02-17 DIAGNOSIS — O24410 Gestational diabetes mellitus in pregnancy, diet controlled: Secondary | ICD-10-CM

## 2018-02-17 DIAGNOSIS — O0992 Supervision of high risk pregnancy, unspecified, second trimester: Secondary | ICD-10-CM | POA: Diagnosis not present

## 2018-02-17 NOTE — Patient Instructions (Signed)
Read booklet on Gestational Diabetes Follow Gestational Meal Planning Guidelines Avoid fruit juices and sugar sweetened drinks Avoid cereal and milk for breakfast Complete a 3 Day Food Record and bring to next appointment Check blood sugars 4 x day - before breakfast and 2 hrs after every meal and record  Bring blood sugar log to all appointments Call MD for prescription for meter strips and lancets Strips  Accu-Chek Guide Lancets   Accu-Chek FastClix Purchase urine ketone strips if ordered by MD and check urine ketones every am:  If + increase bedtime snack to 1 protein and 2 carbohydrate servings Walk 20-30 minutes at least 5 x week if permitted by MD Reduce/Quit smoking

## 2018-02-17 NOTE — Progress Notes (Signed)
Diabetes Self-Management Education  Visit Type: First/Initial  Appt. Start Time: 1510 Appt. End Time: 1625  02/17/2018  Ms. Normalee Sistare, identified by name and date of birth, is a 32 y.o. female with a diagnosis of Diabetes: Gestational Diabetes.   ASSESSMENT  Blood pressure 110/60, height 5\' 4"  (1.626 m), weight 211 lb 14.4 oz (96.1 kg), last menstrual period 08/15/2017. Body mass index is 36.37 kg/m.  Diabetes Self-Management Education - 02/17/18 1649      Visit Information   Visit Type  First/Initial      Initial Visit   Diabetes Type  Gestational Diabetes    Are you currently following a meal plan?  No Pt was drinking sweet tea during visit.     Are you taking your medications as prescribed?  Yes    Date Diagnosed  March 2019      Health Coping   How would you rate your overall health?  Fair      Psychosocial Assessment   Patient Belief/Attitude about Diabetes  Other (comment) feels "no different"    Self-care barriers  None    Self-management support  Doctor's office    Patient Concerns  Healthy Lifestyle;Glycemic Control    Special Needs  None    Preferred Learning Style  Auditory;Visual;Hands on    Learning Readiness  Not Ready Pt reports she is not giving up her sweet tea or smoking. She reports it will be hard to give up fruit juice.     How often do you need to have someone help you when you read instructions, pamphlets, or other written materials from your doctor or pharmacy?  1 - Never    What is the last grade level you completed in school?  College      Pre-Education Assessment   Patient understands the diabetes disease and treatment process.  Needs Instruction    Patient understands incorporating nutritional management into lifestyle.  Needs Instruction    Patient undertands incorporating physical activity into lifestyle.  Needs Instruction    Patient understands using medications safely.  Needs Instruction    Patient understands monitoring blood  glucose, interpreting and using results  Needs Instruction    Patient understands prevention, detection, and treatment of acute complications.  Needs Instruction    Patient understands prevention, detection, and treatment of chronic complications.  Needs Instruction    Patient understands how to develop strategies to address psychosocial issues.  Needs Instruction    Patient understands how to develop strategies to promote health/change behavior.  Needs Instruction      Complications   How often do you check your blood sugar?  0 times/day (not testing) Provided Accu-Chek Guide meter and instructed on use. BG upon return demonstration was 99 mg/dL at 1:61 pm - 3 hrs pp.     Have you had a dilated eye exam in the past 12 months?  No    Have you had a dental exam in the past 12 months?  No    Are you checking your feet?  No      Dietary Intake   Breakfast  cereal and milk    Lunch  spaghetti, fast foods like burgers or chicken - occasional fries    Snack (afternoon)  crackers and peanut butter or cheese    Dinner  chicken, pork, beef, potatoes, pasta, peas, beans, corn, salad    Beverage(s)  water, juice, sweet tea      Exercise   Exercise Type  Light (walking / raking  leaves)    How many days per week to you exercise?  5    How many minutes per day do you exercise?  45    Total minutes per week of exercise  225      Patient Education   Previous Diabetes Education  Yes (please comment) Pt came to gestational classes 8 years ago.     Disease state   Definition of diabetes, type 1 and 2, and the diagnosis of diabetes    Nutrition management   Role of diet in the treatment of diabetes and the relationship between the three main macronutrients and blood glucose level;Reviewed blood glucose goals for pre and post meals and how to evaluate the patients' food intake on their blood glucose level.    Physical activity and exercise   Role of exercise on diabetes management, blood pressure control and  cardiac health.    Monitoring  Taught/evaluated SMBG meter.;Purpose and frequency of SMBG.;Taught/discussed recording of test results and interpretation of SMBG.;Ketone testing, when, how.    Chronic complications  Relationship between chronic complications and blood glucose control    Psychosocial adjustment  Identified and addressed patients feelings and concerns about diabetes    Preconception care  Pregnancy and GDM  Role of pre-pregnancy blood glucose control on the development of the fetus;Role of family planning for patients with diabetes;Reviewed with patient blood glucose goals with pregnancy    Personal strategies to promote health  Review risk of smoking and offered smoking cessation      Individualized Goals (developed by patient)   Reducing Risk Improve blood sugars Lead a healthier lifestyle     Outcomes   Expected Outcomes  Demonstrated limited interest in learning.  Expect minimal changes       Individualized Plan for Diabetes Self-Management Training:   Learning Objective:  Patient will have a greater understanding of diabetes self-management. Patient education plan is to attend individual and/or group sessions per assessed needs and concerns.   Plan:   Patient Instructions  Read booklet on Gestational Diabetes Follow Gestational Meal Planning Guidelines Avoid fruit juices and sugar sweetened drinks Avoid cereal and milk for breakfast Complete a 3 Day Food Record and bring to next appointment Check blood sugars 4 x day - before breakfast and 2 hrs after every meal and record  Bring blood sugar log to all appointments Call MD for prescription for meter strips and lancets Strips  Accu-Chek Guide Lancets   Accu-Chek FastClix Purchase urine ketone strips if ordered by MD and check urine ketones every am:  If + increase bedtime snack to 1 protein and 2 carbohydrate servings Walk 20-30 minutes at least 5 x week if permitted by MD Reduce/Quit smoking   Expected  Outcomes:  Demonstrated limited interest in learning.  Expect minimal changes  Education material provided:  Gestational Booklet Gestational Meal Planning Guidelines Simple Meal Plan Viewed Gestational Diabetes Video Meter = Accu-Chek Guide 3 Day Food Record Goals for a Healthy Pregnancy  If problems or questions, patient to contact team via:  Sharion SettlerSheila Shotwell, RN, CCM, CDE [phone]  Future DSME appointment:  February 24, 2018 with the dietitian

## 2018-02-24 ENCOUNTER — Ambulatory Visit: Payer: Medicaid Other | Admitting: Dietician

## 2018-02-28 ENCOUNTER — Telehealth: Payer: Self-pay | Admitting: Dietician

## 2018-02-28 NOTE — Telephone Encounter (Signed)
Called patient and left voicemail message in regards to appointment she missed on 02/24/18. Requested a call back for her to reschedule.

## 2018-03-04 ENCOUNTER — Encounter: Payer: Self-pay | Admitting: Dietician

## 2018-03-04 NOTE — Progress Notes (Signed)
Have not yet heard back from patient to reschedule. Sent discharge letter to referring provider.

## 2018-03-12 ENCOUNTER — Observation Stay
Admission: EM | Admit: 2018-03-12 | Discharge: 2018-03-12 | Disposition: A | Discharge status: Home / Self Care | Payer: Medicaid Other | Attending: Obstetrics and Gynecology | Admitting: Obstetrics and Gynecology

## 2018-03-12 ENCOUNTER — Other Ambulatory Visit: Payer: Self-pay

## 2018-03-12 DIAGNOSIS — Z7982 Long term (current) use of aspirin: Secondary | ICD-10-CM | POA: Diagnosis not present

## 2018-03-12 DIAGNOSIS — F17210 Nicotine dependence, cigarettes, uncomplicated: Secondary | ICD-10-CM | POA: Diagnosis not present

## 2018-03-12 DIAGNOSIS — M545 Low back pain: Secondary | ICD-10-CM | POA: Diagnosis not present

## 2018-03-12 DIAGNOSIS — O26892 Other specified pregnancy related conditions, second trimester: Principal | ICD-10-CM | POA: Insufficient documentation

## 2018-03-12 DIAGNOSIS — Z881 Allergy status to other antibiotic agents status: Secondary | ICD-10-CM | POA: Insufficient documentation

## 2018-03-12 DIAGNOSIS — Z880 Allergy status to penicillin: Secondary | ICD-10-CM | POA: Diagnosis not present

## 2018-03-12 DIAGNOSIS — Z3A25 25 weeks gestation of pregnancy: Secondary | ICD-10-CM | POA: Insufficient documentation

## 2018-03-12 DIAGNOSIS — O99332 Smoking (tobacco) complicating pregnancy, second trimester: Secondary | ICD-10-CM | POA: Diagnosis not present

## 2018-03-12 DIAGNOSIS — R109 Unspecified abdominal pain: Secondary | ICD-10-CM

## 2018-03-12 DIAGNOSIS — R1030 Lower abdominal pain, unspecified: Secondary | ICD-10-CM | POA: Diagnosis not present

## 2018-03-12 DIAGNOSIS — O26899 Other specified pregnancy related conditions, unspecified trimester: Secondary | ICD-10-CM | POA: Diagnosis present

## 2018-03-12 LAB — COMPREHENSIVE METABOLIC PANEL
ALBUMIN: 2.8 g/dL — AB (ref 3.5–5.0)
ALK PHOS: 72 U/L (ref 38–126)
ALT: 13 U/L — ABNORMAL LOW (ref 14–54)
ANION GAP: 8 (ref 5–15)
AST: 16 U/L (ref 15–41)
BILIRUBIN TOTAL: 0.4 mg/dL (ref 0.3–1.2)
BUN: 6 mg/dL (ref 6–20)
CO2: 20 mmol/L — ABNORMAL LOW (ref 22–32)
Calcium: 8.1 mg/dL — ABNORMAL LOW (ref 8.9–10.3)
Chloride: 108 mmol/L (ref 101–111)
Creatinine, Ser: 0.59 mg/dL (ref 0.44–1.00)
GFR calc Af Amer: >60 mL/min (ref >60)
GFR calc non Af Amer: >60 mL/min (ref >60)
GLUCOSE: 105 mg/dL — AB (ref 65–99)
Potassium: 3.6 mmol/L (ref 3.5–5.1)
Sodium: 136 mmol/L (ref 135–145)
TOTAL PROTEIN: 5.9 g/dL — AB (ref 6.5–8.1)

## 2018-03-12 LAB — URINALYSIS, COMPLETE (UACMP) WITH MICROSCOPIC
Bacteria, UA: NONE SEEN
Bilirubin Urine: NEGATIVE
GLUCOSE, UA: NEGATIVE mg/dL
Hgb urine dipstick: NEGATIVE
Ketones, ur: NEGATIVE mg/dL
Leukocytes, UA: NEGATIVE
Nitrite: NEGATIVE
PH: 6 (ref 5.0–8.0)
Protein, ur: NEGATIVE mg/dL
Specific Gravity, Urine: 1.015 (ref 1.005–1.030)

## 2018-03-12 LAB — CBC
HCT: 33.9 % — ABNORMAL LOW (ref 35.0–47.0)
HEMOGLOBIN: 11.2 g/dL — AB (ref 12.0–16.0)
MCH: 28.5 pg (ref 26.0–34.0)
MCHC: 33.1 g/dL (ref 32.0–36.0)
MCV: 86 fL (ref 80.0–100.0)
Platelets: 192 10*3/uL (ref 150–440)
RBC: 3.94 MIL/uL (ref 3.80–5.20)
RDW: 14 % (ref 11.5–14.5)
WBC: 12.6 10*3/uL — ABNORMAL HIGH (ref 3.6–11.0)

## 2018-03-12 LAB — URINE DRUG SCREEN, QUALITATIVE (ARMC ONLY)
AMPHETAMINES, UR SCREEN: NOT DETECTED
Barbiturates, Ur Screen: NOT DETECTED
Benzodiazepine, Ur Scrn: NOT DETECTED
CANNABINOID 50 NG, UR ~~LOC~~: NOT DETECTED
Cocaine Metabolite,Ur ~~LOC~~: POSITIVE — AB
MDMA (Ecstasy)Ur Screen: NOT DETECTED
Methadone Scn, Ur: NOT DETECTED
Opiate, Ur Screen: NOT DETECTED
PHENCYCLIDINE (PCP) UR S: NOT DETECTED
Tricyclic, Ur Screen: NOT DETECTED

## 2018-03-12 MED ORDER — ACETAMINOPHEN 325 MG PO TABS: 650.0000 mg | ORAL_TABLET | ORAL | Status: DC | PRN

## 2018-03-12 NOTE — Discharge Instructions (Signed)
Back Pain in Pregnancy Back pain during pregnancy is common. Back pain may be caused by several factors that are related to changes during your pregnancy. Follow these instructions at home: Managing pain, stiffness, and swelling  If directed, apply ice for sudden (acute) back pain. ? Put ice in a plastic bag. ? Place a towel between your skin and the bag. ? Leave the ice on for 20 minutes, 2-3 times per day.  If directed, apply heat to the affected area before you exercise: ? Place a towel between your skin and the heat pack or heating pad. ? Leave the heat on for 20-30 minutes. ? Remove the heat if your skin turns bright red. This is especially important if you are unable to feel pain, heat, or cold. You may have a greater risk of getting burned. Activity  Exercise as told by your health care provider. Exercising is the best way to prevent or manage back pain.  Listen to your body when lifting. If lifting hurts, ask for help or bend your knees. This uses your leg muscles instead of your back muscles.  Squat down when picking up something from the floor. Do not bend over.  Only use bed rest as told by your health care provider. Bed rest should only be used for the most severe episodes of back pain. Standing, Sitting, and Lying Down  Do not stand in one place for long periods of time.  Use good posture when sitting. Make sure your head rests over your shoulders and is not hanging forward. Use a pillow on your lower back if necessary.  Try sleeping on your side, preferably the left side, with a pillow or two between your legs. If you are sore after a night's rest, your bed may be too soft. A firm mattress may provide more support for your back during pregnancy. General instructions  Do not wear high heels.  Eat a healthy diet. Try to gain weight within your health care provider's recommendations.  Use a maternity girdle, elastic sling, or back brace as told by your health care  provider.  Take over-the-counter and prescription medicines only as told by your health care provider.  Keep all follow-up visits as told by your health care provider. This is important. This includes any visits with any specialists, such as a physical therapist. Contact a health care provider if:  Your back pain interferes with your daily activities.  You have increasing pain in other parts of your body. Get help right away if:  You develop numbness, tingling, weakness, or problems with the use of your arms or legs.  You develop severe back pain that is not controlled with medicine.  You have a sudden change in bowel or bladder control.  You develop shortness of breath, dizziness, or you faint.  You develop nausea, vomiting, or sweating.  You have back pain that is a rhythmic, cramping pain similar to labor pains. Labor pain is usually 1-2 minutes apart, lasts for about 1 minute, and involves a bearing down feeling or pressure in your pelvis.  You have back pain and your water breaks or you have vaginal bleeding.  You have back pain or numbness that travels down your leg.  Your back pain developed after you fell.  You develop pain on one side of your back.  You see blood in your urine.  You develop skin blisters in the area of your back pain. This information is not intended to replace advice given to you   by your health care provider. Make sure you discuss any questions you have with your health care provider. Document Released: 03/03/2006 Document Revised: 04/30/2016 Document Reviewed: 08/07/2015 Elsevier Interactive Patient Education  2018 [employer]  

## 2018-03-12 NOTE — Discharge Summary (Addendum)
Obstetric Discharge Summary   Patient ID: Rosalio MacadamiaMegan A Lowden MRN: [id number] DOB/AGE: 02-09-1986 32 y.o.   Date of Admission: 03/12/2018  Date of Discharge: 03/12/18  Admitting Diagnosis: IUP at [id number]  Secondary Diagnosis: Abdominal pain, Lower back pain, cocaine pos  Mode of Delivery: N/A     Discharge Diagnosis: iUP t 25 5/7 weeks with lower abd and lower back pain   Intrapartum Procedures: N/A   Post partum procedures: N/A  Complications: pos cocaine in urine    Brief Hospital Course  Calee A Rubye OaksDickerson is a [id number] who presented with lower back and lower abd pain this pm. She felt like the pain was a 10 on 1-10 scale. Pt has been standing at work all day. Pt was informed that she had urine + for cocaine and stated, "There is no way, then she acknowledged and said she would quit". US ordered but, pt elected to go home as there was a large wait for US. Agreed that abruption was unlikely and no Vag bleeding noted. Pt will be discharged home and fu Monday for appt and US can be done next week. Pain seems improved.    Labs: CBC Latest Ref Rng & Units 09/25/2017 09/28/2016 09/27/2016  WBC 3.6 - 11.0 K/uL 15.1(H) 13.7(H) 7.4  Hemoglobin 12.0 - 16.0 g/dL 65.712.6 84.612.0 96.212.6  Hematocrit 35.0 - 47.0 % 37.4 35.3 38.5  Platelets 150 - 440 K/uL 271 243 230    Physical exam:  Blood pressure 105/64, pulse 92, temperature 97.8 F (36.6 C), temperature source Oral, resp. rate 20, height 5\' 4"  (1.626 m), weight 214 lb (97.1 kg), last menstrual period 08/15/2017. General: alert and no distress Abdomen:Gravid, soft, NT Uterine Fundus: firm Extremities: No evidence of DVT seen on physical exam. No lower extremity edema.  Discharge Instructions: Counseled to stop cocaine. FU at [facility] ASAP.  Activity: Advance as tolerated. Pelvic rest for now.  Diet: Regular Medications:PNV, Tylenol,   Outpatient follow up:  Postpartum contraception:   Discharged Condition:Stable  Discharged to:  Home  ___________________________ Myrtie Cruisearon W. Jones,RN, MSN, CNM, FNP Certified Nurse Midwife [facility]

## 2018-03-12 NOTE — OB Triage Note (Signed)
Pt arrived in triage with c/o sudden "sharp, constant" lower back and lower abdomen pain that started about 1 hour ago. Reports good fetal movement. Denies leaking of fluid and vaginal bleeding. Monitors applied and assessing.

## 2018-03-12 NOTE — OB Triage Note (Signed)
Pt given discharge instructions and verbalized understanding. Discharged ambulatory in stable condition accompanied by mother.

## 2018-03-12 NOTE — Progress Notes (Signed)
[name] is a 32 y.o. female. She is at 3950w5d gestation. Patient's last menstrual period was 08/15/2017 (exact date). Estimated Date of Delivery: 06/20/18.   [facility]   Chief complaint: Lower abd pain which radiates around her entire lower back starting 1 hour ago. Pain is intense and constant, Pt is writhing from pain and feels she was lying in bed when the pain came on.   Location: Lower pelvic/lower back  Onset/timing:1 hour ago Duration: Constant  Quality: Intense rates it as a "10" on -1-10 scale Severity:Feels it is severe and pt is writhing about Aggravating or alleviating conditions:N/A Associated signs/symptoms:N/A Context:Unknown  S: Pt has never felt UC's as she had a LTCS and had no labor pattern. no CTX, no VB.no LOF,  Active fetal movement noted  Maternal Medical History:   Past Medical History:  Diagnosis Date  . Gestational diabetes   . Headache   . Heroin abuse [facility](HCC)     Past Surgical History:  Procedure Laterality Date  . ANKLE SURGERY Left 2012  . CESAREAN SECTION  2011  . CHOLECYSTECTOMY N/A 09/27/2016   Procedure: LAPAROSCOPIC CHOLECYSTECTOMY WITH INTRAOPERATIVE CHOLANGIOGRAM;  Surgeon: Lattie Hawichard E Cooper, MD;  Location: [facility] ORS;  Service: General;  Laterality: N/A;  . DILATION AND CURETTAGE OF UTERUS  2013  . TONSILLECTOMY  2007    Allergies  Allergen Reactions  . Clindamycin/Lincomycin Anaphylaxis and Rash  . Amoxicillin Rash    Prior to Admission medications   Medication Sig Start Date End Date Taking? Authorizing Provider  aspirin EC 81 MG tablet Take 81 mg by mouth daily. 12/14/17 12/14/18  [provider]  DOCOSAHEXAENOIC ACID PO Take 1 tablet by mouth daily.    [provider]     Social History: She  reports that she has been smoking cigarettes.  She has a 6.50 pack-year smoking history. She has never used smokeless tobacco. She reports that she does not drink alcohol or use drugs.  Smokes 1/2 ppd.   Family History: family history includes Cancer in her mother. no history of gyn cancers  Review of Systems: A full review of systems was performed and negative except as noted in the HPI.   Review of Systems - Review of Systems - Review of Systems  Constitutional: Negative.   HENT: Negative.   Eyes: Negative.   Respiratory: Negative.   Cardiovascular: Negative.   Gastrointestinal: Positive for abdominal pain.  Genitourinary: Negative.   Musculoskeletal: Positive for back pain.  Skin: Negative.   Neurological: Negative.   Endo/Heme/Allergies: Negative.   Psychiatric/Behavioral: Negative.     O: T98.7 BP 105/64  Pulse:92 R:20 Constitutional: NAD, AAOx3  HE/ENT: extraocular movements grossly intact, moist mucous membranes CV: RRR PULM: nl respiratory effort, CTABL     Abd: gravid, non-tender, non-distended, soft      Ext: Non-tender, Nonedmeatous   Psych: mood appropriate, speech normal Pelvic: closed/long/vtx? Back:paravertebral spasm noted of lower back bilat & Rt Trapezius NST: Reactive with 2 accels 15 x 15 BPM Baseline: 145 Variability: moderate Accelerations present x >2 Decelerations absent Time 20mins    A/P: 32 y.o. 2450w5d here for lower abd and back pain.   Labor: not present.   Fetal Wellbeing: Reassuring Cat 1 tracing.  Reactive NST   Cocaine positive:Disc with pt and she said she is quitting, Advised that cocaine can cause PTL, delivery, abruption and to avoid cocaine  D/c home stable, precautions reviewed, follow-up as scheduled.   Disc transportation needed to  go to [place] and to start checking sugars  ----- Myrtie Cruise, MSN, CNM, FNP Certified Nurse Midwife [facility]

## 2018-03-14 LAB — URINE CULTURE: Culture: <10000 — AB

## 2018-03-28 DIAGNOSIS — D649 Anemia, unspecified: Secondary | ICD-10-CM | POA: Insufficient documentation

## 2018-05-23 LAB — OB RESULTS CONSOLE GC/CHLAMYDIA
CHLAMYDIA, DNA PROBE: NEGATIVE
Gonorrhea: NEGATIVE

## 2018-05-23 LAB — OB RESULTS CONSOLE GBS: STREP GROUP B AG: POSITIVE

## 2018-05-23 LAB — OB RESULTS CONSOLE RPR: RPR: NONREACTIVE

## 2018-05-23 LAB — OB RESULTS CONSOLE HIV ANTIBODY (ROUTINE TESTING): HIV: NONREACTIVE

## 2018-05-31 NOTE — H&P (Signed)
Ariana MacadamiaMegan A [name] is a 32 y.o. female G4P1  With EDC of 06/22/18 presenting for repeat LTCS and BTL  At 39+0 weeks  OB History    Gravida  4   Para  1   Term      Preterm      AB  2   Living        SAB  1   TAB      Ectopic  1   Multiple      Live Births           Obstetric Comments  1st Menstrual Cycle:  12 1st Pregnancy:  21       Past Medical History:  Diagnosis Date  . Gestational diabetes   . Headache   . Heroin abuse (HCC)   drug use remote . Negative recent UDS  + tobacco use  Past Surgical History:  Procedure Laterality Date  . ANKLE SURGERY Left 2012  . CESAREAN SECTION  2011  . CHOLECYSTECTOMY N/A 09/27/2016   Procedure: LAPAROSCOPIC CHOLECYSTECTOMY WITH INTRAOPERATIVE CHOLANGIOGRAM;  Surgeon: Ariana Hawichard E Cooper, MD;  Location: [facility] ORS;  Service: General;  Laterality: N/A;  . DILATION AND CURETTAGE OF UTERUS  2013  . TONSILLECTOMY  2007   Family History: family history includes Cancer in her mother. Social History:  reports that she has been smoking cigarettes.  She has a 6.50 pack-year smoking history. She has never used smokeless tobacco. She reports that she does not drink alcohol or use drugs.     Maternal Diabetes: No Genetic Screening: Declined Maternal Ultrasounds/Referrals: Normal Fetal Ultrasounds or other Referrals:  None, growth u/s normal  Maternal Substance Abuse:  Yes:  Type: Other: cocaine , heroin , MJ - remote .  Significant Maternal Medications:  None Significant Maternal Lab Results:  None Other Comments:  None  ROS unremarkable  History   allergies: Chlindamycin - anaphylaxis , Amoxicillin -rash   Last menstrual period 08/15/2017. Exam Physical Exam   Lungs CTA   Cv RRR  Abd: gravid  Prenatal labs: ABO, Rh:   Oneg Antibody:  neg Rubella:  imm , varicella Imm RPR:   NR  HBsAg:   neg  HIV:   Neg GBS:   Positive   Assessment/Plan: Elective repeat LTCS and elective BTL ( medicaid papers  signed)  06/15/18 [name] 05/31/2018, 2:51 PM

## 2018-06-10 ENCOUNTER — Other Ambulatory Visit: Payer: Self-pay

## 2018-06-10 ENCOUNTER — Observation Stay
Admission: EM | Admit: 2018-06-10 | Discharge: 2018-06-10 | Disposition: A | Discharge status: Home / Self Care | Payer: Medicaid Other | Attending: Obstetrics & Gynecology | Admitting: Obstetrics & Gynecology

## 2018-06-10 DIAGNOSIS — Z3A38 38 weeks gestation of pregnancy: Secondary | ICD-10-CM | POA: Diagnosis not present

## 2018-06-10 DIAGNOSIS — O471 False labor at or after 37 completed weeks of gestation: Principal | ICD-10-CM | POA: Insufficient documentation

## 2018-06-10 NOTE — OB Triage Note (Signed)
Pt states she has had back pain since yesterday around 2000. Pt states the pain hit her out of nowhere and it made her feel really nauseous. Pt states no LOF, but had a "sticky stuff in her underwear this morning". Pt states + FM. Monitors applied and assessing.

## 2018-06-11 ENCOUNTER — Observation Stay
Admission: EM | Admit: 2018-06-11 | Discharge: 2018-06-11 | Disposition: A | Discharge status: Home / Self Care | Payer: Medicaid Other | Attending: Obstetrics & Gynecology | Admitting: Obstetrics & Gynecology

## 2018-06-11 DIAGNOSIS — Z3483 Encounter for supervision of other normal pregnancy, third trimester: Secondary | ICD-10-CM | POA: Diagnosis present

## 2018-06-11 DIAGNOSIS — Z3A38 38 weeks gestation of pregnancy: Secondary | ICD-10-CM | POA: Diagnosis not present

## 2018-06-11 NOTE — OB Triage Note (Signed)
G4P1 pt 3029w5d presents to BP d/t ctx and decreased fetal movement. Pt is scheduled for a repeat cesarean on 06/15/2018. DDM diet controlled. Denies vaginal bleeding, LOF. Monitors applied and assessing.

## 2018-06-11 NOTE — Discharge Summary (Signed)
[name] is a 32 y.o. female. She is at 10657w4d gestation. Patient's last menstrual period was 08/15/2017 (exact date). Estimated Date of Delivery: 06/20/18  [facility]   Patient presented for evaluation of back pain, contractions, and fetal movement.  Patient had cervical exam by RN and this was reported to me. I reviewed her vital signs and fetal tracing, both of which were reassuring.  Patient was discharged as she was not laboring.  She has a scheduled repeat cesarean with Dr.Scheremerhorn on 06/15/18.  Should she progress into active labor, we will do cesarean at that time.  NST interpretation: Reactive.  Ranae Plumberhelsea Pearline Yerby, MD Attending Obstetrician and Gynecologist [facility]

## 2018-06-11 NOTE — Discharge Summary (Signed)
Patient's last menstrual period was 08/15/2017 (exact date). EDC: Estimated Date of Delivery: 06/20/18 EGA: 4211w5d  Patient presented for evaluation of labor.  Patient had cervical exam by RN and this was reported to me. I reviewed her vital signs and fetal tracing, both of which were reassuring.  Patient was discharged as she was not laboring.  She has repeat CS scheduled on 7/10  NST interpretation: Reactive.  Ranae Plumberhelsea Ward, MD Attending Obstetrician and Gynecologist [facility]

## 2018-06-14 ENCOUNTER — Other Ambulatory Visit: Payer: Self-pay

## 2018-06-14 ENCOUNTER — Encounter
Admission: RE | Admit: 2018-06-14 | Discharge: 2018-06-14 | Disposition: A | Discharge status: Home / Self Care | Payer: Medicaid Other | Source: Ambulatory Visit | Attending: Obstetrics and Gynecology | Admitting: Obstetrics and Gynecology

## 2018-06-14 DIAGNOSIS — Z01812 Encounter for preprocedural laboratory examination: Secondary | ICD-10-CM | POA: Insufficient documentation

## 2018-06-14 LAB — CBC
HCT: 33.4 % — ABNORMAL LOW (ref 35.0–47.0)
Hemoglobin: 10.9 g/dL — ABNORMAL LOW (ref 12.0–16.0)
MCH: 25.8 pg — ABNORMAL LOW (ref 26.0–34.0)
MCHC: 32.7 g/dL (ref 32.0–36.0)
MCV: 78.9 fL — ABNORMAL LOW (ref 80.0–100.0)
Platelets: 226 10*3/uL (ref 150–440)
RBC: 4.24 MIL/uL (ref 3.80–5.20)
RDW: 15.2 % — ABNORMAL HIGH (ref 11.5–14.5)
WBC: 13.6 10*3/uL — ABNORMAL HIGH (ref 3.6–11.0)

## 2018-06-14 LAB — TYPE AND SCREEN
ABO/RH(D): O NEG
ANTIBODY SCREEN: NEGATIVE
Extend sample reason: UNDETERMINED

## 2018-06-14 NOTE — Patient Instructions (Signed)
Your procedure is scheduled on: tomorrow Report to ER 9:00.Remember: Instructions that are not followed completely may result in serious medical risk,  up to and including death, or upon the discretion of your surgeon and anesthesiologist your  surgery may need to be rescheduled.     _X__ 1. Do not eat food after midnight the night before your procedure.                 No gum chewing or hard candies. You may drink clear liquids up to 2 hours                 before you are scheduled to arrive for your surgery- DO not drink clear                 liquids within 2 hours of the start of your surgery.                 Clear Liquids include:  water, __X__2.  On the morning of surgery brush your teeth with toothpaste and water, you                may rinse your mouth with mouthwash if you wish.  Do not swallow any toothpaste of mouthwash.     _X__ 3.  No Alcohol for 24 hours before or after surgery.   _X__ 4.  Do Not Smoke or use e-cigarettes For 24 Hours Prior to Your Surgery.                 Do not use any chewable tobacco products for at least 6 hours prior to                 surgery.  ____  5.  Bring all medications with you on the day of surgery if instructed.   ____  6.  Notify your doctor if there is any change in your medical condition      (cold, fever, infections).     Do not wear jewelry, make-up, hairpins, clips or nail polish. Do not wear lotions, powders, or perfumes. You may wear deodorant. Do not shave 48 hours prior to surgery. Men may shave face and neck. Do not bring valuables to the hospital.    [facility] is not responsible for any belongings or valuables.  Contacts, dentures or bridgework may not be worn into surgery. Leave your suitcase in the car. After surgery it may be brought to your room. For patients admitted to the hospital, discharge time is determined by your treatment team.   Patients discharged the day of surgery will not be allowed  to drive home.   Please read over the following fact sheets that you were given:    __x__ Take these medicines the morning of surgery with A SIP OF WATER:    1. Zantac  2.   3.   4.  5.  6.  ____ Fleet Enema (as directed)   __x__ Use CHG Soap as directed  ____ Use inhalers on the day of surgery  ____ Stop metformin 2 days prior to surgery    ____ Take 1/2 of usual insulin dose the night before surgery. No insulin the morning          of surgery.   __x__ aspirin continued  ____ Stop Anti-inflammatories on    ____ Stop supplements until after surgery.    ____ Bring C-Pap to the hospital.

## 2018-06-15 ENCOUNTER — Other Ambulatory Visit: Payer: Self-pay

## 2018-06-15 ENCOUNTER — Encounter
Admission: RE | Disposition: A | Discharge status: Home / Self Care | Payer: Self-pay | Source: Ambulatory Visit | Attending: Obstetrics and Gynecology

## 2018-06-15 ENCOUNTER — Inpatient Hospital Stay: Payer: Medicaid Other | Admitting: Anesthesiology

## 2018-06-15 ENCOUNTER — Inpatient Hospital Stay
Admission: RE | Admit: 2018-06-15 | Discharge: 2018-06-17 | DRG: 785 | Disposition: A | Discharge status: Home / Self Care | Payer: Medicaid Other | Source: Ambulatory Visit | Attending: Obstetrics and Gynecology | Admitting: Obstetrics and Gynecology

## 2018-06-15 DIAGNOSIS — O99344 Other mental disorders complicating childbirth: Secondary | ICD-10-CM | POA: Diagnosis present

## 2018-06-15 DIAGNOSIS — Z302 Encounter for sterilization: Secondary | ICD-10-CM

## 2018-06-15 DIAGNOSIS — D509 Iron deficiency anemia, unspecified: Secondary | ICD-10-CM | POA: Diagnosis present

## 2018-06-15 DIAGNOSIS — O34211 Maternal care for low transverse scar from previous cesarean delivery: Secondary | ICD-10-CM | POA: Diagnosis present

## 2018-06-15 DIAGNOSIS — F329 Major depressive disorder, single episode, unspecified: Secondary | ICD-10-CM | POA: Diagnosis present

## 2018-06-15 DIAGNOSIS — O99334 Smoking (tobacco) complicating childbirth: Secondary | ICD-10-CM | POA: Diagnosis present

## 2018-06-15 DIAGNOSIS — Z3A39 39 weeks gestation of pregnancy: Secondary | ICD-10-CM

## 2018-06-15 DIAGNOSIS — Z9889 Other specified postprocedural states: Secondary | ICD-10-CM

## 2018-06-15 DIAGNOSIS — F17210 Nicotine dependence, cigarettes, uncomplicated: Secondary | ICD-10-CM | POA: Diagnosis present

## 2018-06-15 DIAGNOSIS — Z79899 Other long term (current) drug therapy: Secondary | ICD-10-CM | POA: Diagnosis not present

## 2018-06-15 DIAGNOSIS — O9902 Anemia complicating childbirth: Secondary | ICD-10-CM | POA: Diagnosis present

## 2018-06-15 LAB — GLUCOSE, CAPILLARY
Glucose-Capillary: 102 mg/dL — ABNORMAL HIGH (ref 70–99)
Glucose-Capillary: 88 mg/dL (ref 70–99)

## 2018-06-15 LAB — URINE DRUG SCREEN, QUALITATIVE (ARMC ONLY)
Amphetamines, Ur Screen: NOT DETECTED
Benzodiazepine, Ur Scrn: NOT DETECTED
Cannabinoid 50 Ng, Ur ~~LOC~~: NOT DETECTED
Cocaine Metabolite,Ur ~~LOC~~: NOT DETECTED
MDMA (ECSTASY) UR SCREEN: NOT DETECTED
Methadone Scn, Ur: NOT DETECTED
Opiate, Ur Screen: NOT DETECTED
Phencyclidine (PCP) Ur S: NOT DETECTED
TRICYCLIC, UR SCREEN: NOT DETECTED

## 2018-06-15 LAB — ABO/RH: ABO/RH(D): O NEG

## 2018-06-15 SURGERY — Surgical Case
Anesthesia: Spinal | Site: Abdomen | Laterality: Bilateral | Wound class: Clean Contaminated

## 2018-06-15 MED ORDER — FENTANYL CITRATE (PF) 100 MCG/2ML IJ SOLN
25.0000 ug | INTRAMUSCULAR | Status: DC | PRN
  Administered 2018-06-15 (×3): 25 ug via INTRAVENOUS
  Filled 2018-06-15: qty 2

## 2018-06-15 MED ORDER — ONDANSETRON HCL 4 MG/2ML IJ SOLN
INTRAMUSCULAR | Status: DC | PRN
  Administered 2018-06-15: 4 mg via INTRAVENOUS

## 2018-06-15 MED ORDER — BUPIVACAINE HCL (PF) 0.5 % IJ SOLN
INTRAMUSCULAR | Status: DC | PRN
  Administered 2018-06-15: 30 mL

## 2018-06-15 MED ORDER — BUPIVACAINE IN DEXTROSE 0.75-8.25 % IT SOLN
INTRATHECAL | Status: DC | PRN
  Administered 2018-06-15: 1.6 mL via INTRATHECAL

## 2018-06-15 MED ORDER — BUPIVACAINE LIPOSOME 1.3 % IJ SUSP
20.0000 mL | Freq: Once | INTRAMUSCULAR | Status: DC
  Filled 2018-06-15: qty 20

## 2018-06-15 MED ORDER — LACTATED RINGERS IV SOLN
INTRAVENOUS | Status: DC
  Administered 2018-06-16: 03:00:00 via INTRAVENOUS

## 2018-06-15 MED ORDER — SIMETHICONE 80 MG PO CHEW
80.0000 mg | CHEWABLE_TABLET | ORAL | Status: DC | PRN
  Administered 2018-06-15 – 2018-06-16 (×4): 80 mg via ORAL
  Filled 2018-06-15 (×3): qty 1

## 2018-06-15 MED ORDER — SODIUM CHLORIDE 0.9 % IV SOLN
INTRAVENOUS | Status: DC | PRN
  Administered 2018-06-15: 60 mL

## 2018-06-15 MED ORDER — PRENATAL MULTIVITAMIN CH
1.0000 | ORAL_TABLET | Freq: Every day | ORAL | Status: DC
  Administered 2018-06-16: 1 via ORAL
  Filled 2018-06-15 (×2): qty 1

## 2018-06-15 MED ORDER — PHENYLEPHRINE HCL 10 MG/ML IJ SOLN
INTRAMUSCULAR | Status: DC | PRN
  Administered 2018-06-15: 40 ug/min via INTRAVENOUS

## 2018-06-15 MED ORDER — PROPOFOL 10 MG/ML IV BOLUS
INTRAVENOUS | Status: AC
  Filled 2018-06-15: qty 20

## 2018-06-15 MED ORDER — LACTATED RINGERS IV SOLN
INTRAVENOUS | Status: DC
  Administered 2018-06-15: 1000 mL via INTRAVENOUS
  Administered 2018-06-15 (×2): via INTRAVENOUS

## 2018-06-15 MED ORDER — OXYTOCIN 40 UNITS IN LACTATED RINGERS INFUSION - SIMPLE MED
INTRAVENOUS | Status: AC
  Filled 2018-06-15: qty 1000

## 2018-06-15 MED ORDER — ZOLPIDEM TARTRATE 5 MG PO TABS: 5.0000 mg | ORAL_TABLET | Freq: Every evening | ORAL | Status: DC | PRN

## 2018-06-15 MED ORDER — SODIUM CHLORIDE 0.9 % IJ SOLN
INTRAMUSCULAR | Status: AC
  Filled 2018-06-15: qty 50

## 2018-06-15 MED ORDER — COCONUT OIL OIL: 1.0000 "application " | TOPICAL_OIL | Status: DC | PRN

## 2018-06-15 MED ORDER — DIPHENHYDRAMINE HCL 25 MG PO CAPS: 25.0000 mg | ORAL_CAPSULE | Freq: Four times a day (QID) | ORAL | Status: DC | PRN

## 2018-06-15 MED ORDER — CEFAZOLIN SODIUM-DEXTROSE 2-4 GM/100ML-% IV SOLN
2.0000 g | INTRAVENOUS | Status: AC
  Administered 2018-06-15: 2 g via INTRAVENOUS
  Filled 2018-06-15 (×2): qty 100

## 2018-06-15 MED ORDER — MENTHOL 3 MG MT LOZG
1.0000 | LOZENGE | OROMUCOSAL | Status: DC | PRN
  Filled 2018-06-15: qty 9

## 2018-06-15 MED ORDER — FENTANYL CITRATE (PF) 100 MCG/2ML IJ SOLN
INTRAMUSCULAR | Status: DC | PRN
  Administered 2018-06-15: 20 ug via INTRATHECAL

## 2018-06-15 MED ORDER — SOD CITRATE-CITRIC ACID 500-334 MG/5ML PO SOLN
ORAL | Status: AC
  Administered 2018-06-15: 30 mL
  Filled 2018-06-15: qty 15

## 2018-06-15 MED ORDER — SIMETHICONE 80 MG PO CHEW
80.0000 mg | CHEWABLE_TABLET | Freq: Three times a day (TID) | ORAL | Status: DC
  Administered 2018-06-16 – 2018-06-17 (×3): 80 mg via ORAL
  Filled 2018-06-15 (×4): qty 1

## 2018-06-15 MED ORDER — ACETAMINOPHEN 325 MG PO TABS: 650.0000 mg | ORAL_TABLET | ORAL | Status: DC | PRN

## 2018-06-15 MED ORDER — SIMETHICONE 80 MG PO CHEW: 80.0000 mg | CHEWABLE_TABLET | ORAL | Status: DC

## 2018-06-15 MED ORDER — SENNOSIDES-DOCUSATE SODIUM 8.6-50 MG PO TABS
2.0000 | ORAL_TABLET | ORAL | Status: DC
  Administered 2018-06-16 – 2018-06-17 (×2): 2 via ORAL
  Filled 2018-06-15 (×2): qty 2

## 2018-06-15 MED ORDER — EPHEDRINE SULFATE 50 MG/ML IJ SOLN
INTRAMUSCULAR | Status: DC | PRN
  Administered 2018-06-15: 10 mg via INTRAVENOUS

## 2018-06-15 MED ORDER — ACETAMINOPHEN 10 MG/ML IV SOLN
INTRAVENOUS | Status: AC
  Filled 2018-06-15: qty 100

## 2018-06-15 MED ORDER — KETOROLAC TROMETHAMINE 30 MG/ML IJ SOLN
30.0000 mg | Freq: Once | INTRAMUSCULAR | Status: AC
  Administered 2018-06-15: 30 mg via INTRAVENOUS
  Filled 2018-06-15: qty 1

## 2018-06-15 MED ORDER — WITCH HAZEL-GLYCERIN EX PADS: 1.0000 "application " | MEDICATED_PAD | CUTANEOUS | Status: DC | PRN

## 2018-06-15 MED ORDER — FENTANYL CITRATE (PF) 100 MCG/2ML IJ SOLN
INTRAMUSCULAR | Status: AC
  Filled 2018-06-15: qty 2

## 2018-06-15 MED ORDER — LIDOCAINE HCL (PF) 1 % IJ SOLN
INTRAMUSCULAR | Status: AC
  Filled 2018-06-15: qty 30

## 2018-06-15 MED ORDER — IBUPROFEN 600 MG PO TABS
600.0000 mg | ORAL_TABLET | Freq: Four times a day (QID) | ORAL | Status: DC
  Administered 2018-06-15 – 2018-06-17 (×8): 600 mg via ORAL
  Filled 2018-06-15 (×8): qty 1

## 2018-06-15 MED ORDER — OXYCODONE-ACETAMINOPHEN 5-325 MG PO TABS: 1.0000 | ORAL_TABLET | ORAL | Status: DC | PRN

## 2018-06-15 MED ORDER — ONDANSETRON HCL 4 MG/2ML IJ SOLN: 4.0000 mg | Freq: Once | INTRAMUSCULAR | Status: DC | PRN

## 2018-06-15 MED ORDER — KETOROLAC TROMETHAMINE 30 MG/ML IJ SOLN
INTRAMUSCULAR | Status: AC
  Filled 2018-06-15: qty 1

## 2018-06-15 MED ORDER — SERTRALINE HCL 25 MG PO TABS
50.0000 mg | ORAL_TABLET | Freq: Every day | ORAL | Status: DC
  Administered 2018-06-15 – 2018-06-16 (×2): 50 mg via ORAL
  Filled 2018-06-15 (×2): qty 2

## 2018-06-15 MED ORDER — TETANUS-DIPHTH-ACELL PERTUSSIS 5-2.5-18.5 LF-MCG/0.5 IM SUSP: 0.5000 mL | Freq: Once | INTRAMUSCULAR | Status: DC

## 2018-06-15 MED ORDER — OXYTOCIN 40 UNITS IN LACTATED RINGERS INFUSION - SIMPLE MED
2.5000 [IU]/h | INTRAVENOUS | Status: AC
  Administered 2018-06-15: 2.5 [IU]/h via INTRAVENOUS
  Filled 2018-06-15 (×2): qty 1000

## 2018-06-15 MED ORDER — OXYCODONE-ACETAMINOPHEN 5-325 MG PO TABS
2.0000 | ORAL_TABLET | ORAL | Status: DC | PRN
  Administered 2018-06-15 – 2018-06-17 (×11): 2 via ORAL
  Filled 2018-06-15 (×11): qty 2

## 2018-06-15 MED ORDER — DIBUCAINE 1 % RE OINT: 1.0000 "application " | TOPICAL_OINTMENT | RECTAL | Status: DC | PRN

## 2018-06-15 MED ORDER — OXYTOCIN 40 UNITS IN LACTATED RINGERS INFUSION - SIMPLE MED
INTRAVENOUS | Status: DC | PRN
  Administered 2018-06-15: 400 mL via INTRAVENOUS

## 2018-06-15 SURGICAL SUPPLY — 26 items
BARRIER ADHS 3X4 INTERCEED (GAUZE/BANDAGES/DRESSINGS) ×2 IMPLANT
CANISTER SUCT 3000ML PPV (MISCELLANEOUS) ×2 IMPLANT
CHLORAPREP W/TINT 26ML (MISCELLANEOUS) ×2 IMPLANT
DRSG TELFA 3X8 NADH (GAUZE/BANDAGES/DRESSINGS) ×2 IMPLANT
ELECT CAUTERY BLADE 6.4 (BLADE) ×2 IMPLANT
ELECT REM PT RETURN 9FT ADLT (ELECTROSURGICAL) ×2
ELECTRODE REM PT RTRN 9FT ADLT (ELECTROSURGICAL) ×1 IMPLANT
GAUZE SPONGE 4X4 12PLY STRL (GAUZE/BANDAGES/DRESSINGS) ×2 IMPLANT
GLOVE BIO SURGEON STRL SZ8 (GLOVE) ×2 IMPLANT
GOWN STRL REUS W/ TWL LRG LVL3 (GOWN DISPOSABLE) ×2 IMPLANT
GOWN STRL REUS W/ TWL XL LVL3 (GOWN DISPOSABLE) ×1 IMPLANT
GOWN STRL REUS W/TWL LRG LVL3 (GOWN DISPOSABLE) ×2
GOWN STRL REUS W/TWL XL LVL3 (GOWN DISPOSABLE) ×1
NS IRRIG 1000ML POUR BTL (IV SOLUTION) ×2 IMPLANT
PACK C SECTION AR (MISCELLANEOUS) ×2 IMPLANT
PAD OB MATERNITY 4.3X12.25 (PERSONAL CARE ITEMS) ×2 IMPLANT
PAD PREP 24X41 OB/GYN DISP (PERSONAL CARE ITEMS) ×2 IMPLANT
STAPLER INSORB 30 2030 C-SECTI (MISCELLANEOUS) ×2 IMPLANT
STRAP SAFETY 5IN WIDE (MISCELLANEOUS) ×2 IMPLANT
SUCT VACUUM KIWI BELL (SUCTIONS) ×2 IMPLANT
SUT CHROMIC 1 CTX 36 (SUTURE) ×6 IMPLANT
SUT PLAIN GUT 0 (SUTURE) ×4 IMPLANT
SUT VIC AB 0 CT1 36 (SUTURE) ×4 IMPLANT
SUT VIC AB 1 CT1 36 (SUTURE) ×2 IMPLANT
SUT VIC AB 2-0 SH 27 (SUTURE) ×1
SUT VIC AB 2-0 SH 27XBRD (SUTURE) ×1 IMPLANT

## 2018-06-15 NOTE — Op Note (Signed)
NAME: [name], Ilya A. MEDICAL RECORD [id number] ACCOUNT [id number] DATE OF BIRTH:1986-07-11 FACILITY: [facility] LOCATION: [facility] PHYSICIAN:THOMAS Cloyde ReamsJ. SCHERMERHORN, MD  OPERATIVE REPORT  DATE OF PROCEDURE:  06/15/2018  PREOPERATIVE DIAGNOSES:   1.  Elective repeat cesarean section. 2.  Elective permanent sterilization.  POSTOPERATIVE DIAGNOSES:   1.  Elective repeat cesarean section. 2.  Elective permanent sterilization.  PROCEDURE:   1.  Repeat low transverse cesarean section. 2.  Bilateral tubal ligation, [place].  ANESTHESIA:  Spinal.  SURGEON:    Suzy Bouchardhomas J. Schermerhorn, MD  FIRST ASSISTANT:  Carlean PurlMargaret Havilland, Certified Nurse Midwife.  INDICATIONS:  This is a 32 year old gravida 4, para 1 patient with a prior low transverse cesarean section.  The patient has elected for repeat cesarean section and elective permanent sterilization.  DESCRIPTION OF PROCEDURE:  After adequate spinal anesthesia, the patient was placed in dorsal supine position, hip roll on the right side.  The patient's abdomen was prepped and draped in normal sterile fashion.  Foley catheter was placed.  Timeout was  performed.  The patient did receive 2 grams IV Ancef prior to commencement of the case for surgical prophylaxis.  Pfannenstiel incision was made 2 fingerbreadths above the symphysis pubis.  Sharp dissection was used to identify the fascia.  The fascia  was opened in the midline and opened in a transverse fashion.  The superior aspect of the fascia was grasped with Kocher clamps and the recti muscles were dissected free.  Inferior aspect of the fascia was grasped with Kocher clamps and the pyramidalis  muscle was dissected free.  Entry into the peritoneal cavity was accomplished sharply.  There was some lower uterine scar tissue at the bladder tacked to the lower uterine segment.  This was taken down sharply with Metzenbaum scissors.  A low transverse  uterine incision was made.  Upon entry into  the endometrial cavity, clear fluid resulted.  Fetal head was brought to the incision and a Kiwi vacuum was applied to the occiput of the infant and with one pull, the head was delivered, the vacuum was  removed.  Large shoulders were then delivered through the incision followed by the body.  Baby was vigorous.  The female was then dried on the abdomen for 60 seconds with delayed cord clamping.  Vigorous female was passed to the neonatologist who assigned  Apgar scores of 9 and 10.  Fetal weight 9 pounds 5 ounces.  Time of delivery 1311 on  June 15, 2018.  Placenta was then manually delivered and the uterus was exteriorized.  The endometrial cavity was wiped clean with laparotomy tape.  The cervix was  opened with a ring forceps and this was passed off the operative field.  The uterine incision was closed with #1 chromic suture in a running locking fashion.  One additional figure-of-eight suture was required for good hemostasis.    The right fallopian tube was grasped at the midportion of the fallopian tube and 2 separate 0 plain gut sutures were placed and 1.5 cm portion of fallopian tube was removed.  Good hemostasis was noted.  Similar procedure was repeated on the patient's  left fallopian tube after grasping at the midportion.  Two separate 0 plain gut sutures were placed and 1.5 cm portion of fallopian tube was removed.  Posterior cul-de-sac was irrigated and suctioned and the ovaries appeared normal and the uterus was  placed back into the abdominal cavity.  Paracolic gutters were wiped clean with laparotomy tape and the tubal ligation sites appeared hemostatic.  Uterine incision again appeared hemostatic and Interceed was placed over the uterine incision in a T-shape  fashion.  Fascia was then closed with 0 Vicryl suture in a running nonlocking fashion.  After closure of the fascia.  The fascial edges were injected with a solution of 20 mL of 1.3% Exparel plus 30 mL of 0.5% Marcaine plus 50 mL normal  saline; 60 mL of  the solution was injected in the fascial edges.  Subcutaneous tissues were irrigated and bovied and given the depth of the subcutaneous tissues approximately 4 cm.  The dead space was closed with a running 2-0 chromic suture.  Skin was then  reapproximated with Insorb absorbable staples and an additional 30 mL of Exparel solution was injected subcuticularly.  COMPLICATIONS:  There were no complications.  ESTIMATED BLOOD LOSS:  300 mL.  INTRAOPERATIVE FLUIDS:  700 mL.  URINE OUTPUT:  100 mL.    DISPOSITION:  The patient tolerated the procedure well and was taken to recovery room in good condition.  AN/NUANCE  D:06/15/2018 T:06/15/2018 JOB:[id number]

## 2018-06-15 NOTE — OR Nursing (Signed)
Piercing to left cheek on face in place. Patient states "it wont come out." Patient made aware the potential for burns.

## 2018-06-15 NOTE — Anesthesia Procedure Notes (Signed)
Spinal  Patient location during procedure: OR Start time: 06/15/2018 12:50 PM End time: 06/15/2018 12:52 PM Staffing Anesthesiologist: Gunnar Fusi, MD Resident/CRNA: Hedda Slade, CRNA Performed: resident/CRNA  Preanesthetic Checklist Completed: patient identified, site marked, surgical consent, pre-op evaluation, timeout performed, IV checked, risks and benefits discussed and monitors and equipment checked Spinal Block Patient position: sitting Prep: ChloraPrep Patient monitoring: heart rate, continuous pulse ox, blood pressure and cardiac monitor Approach: midline Location: L3-4 Injection technique: single-shot Needle Needle type: Whitacre and Introducer  Needle gauge: 24 G Needle length: 9 cm Assessment Sensory level: T4 Additional Notes Negative paresthesia. Negative blood return. Positive free-flowing CSF. Expiration date of kit checked and confirmed. Patient tolerated procedure well, without complications.

## 2018-06-15 NOTE — Transfer of Care (Signed)
Immediate Anesthesia Transfer of Care Note  Patient: [name]  Procedure(s) Performed: CESAREAN SECTION WITH BILATERAL TUBAL LIGATION (Bilateral Abdomen)  Patient Location: PACU  Anesthesia Type:Spinal  Level of Consciousness: awake, alert  and oriented  Airway & Oxygen Therapy: Patient Spontanous Breathing  Post-op Assessment: Report given to RN and Post -op Vital signs reviewed and stable  Post vital signs: Reviewed and stable  Last Vitals:  Vitals Value Taken Time  BP 125/73 06/15/2018  1:57 PM  Temp 36.5 C 06/15/2018  1:57 PM  Pulse 73 06/15/2018  1:57 PM  Resp 19 06/15/2018  1:57 PM  SpO2 100 % 06/15/2018  1:57 PM    Last Pain:  Vitals:   06/15/18 0910  TempSrc:   PainSc: 0-No pain      Patients Stated Pain Goal: 0 (06/15/18 0910)  Complications: No apparent anesthesia complications

## 2018-06-15 NOTE — Anesthesia Preprocedure Evaluation (Addendum)
Anesthesia Evaluation  Patient identified by MRN, date of birth, ID band Patient awake    Reviewed: Allergy & Precautions, NPO status , Patient's Chart, lab work & pertinent test results  History of Anesthesia Complications Negative for: history of anesthetic complications  Airway Mallampati: II       Dental  (+) Upper Dentures, Partial Lower   Pulmonary neg sleep apnea, neg COPD, Current Smoker,           Cardiovascular (-) hypertension(-) Past MI and (-) CHF (-) dysrhythmias (-) Valvular Problems/Murmurs     Neuro/Psych neg Seizures    GI/Hepatic Neg liver ROS, GERD  Medicated and Poorly Controlled,  Endo/Other  diabetes  Renal/GU negative Renal ROS     Musculoskeletal   Abdominal   Peds  Hematology   Anesthesia Other Findings   Reproductive/Obstetrics                            Anesthesia Physical Anesthesia Plan  ASA: II  Anesthesia Plan: Spinal   Post-op Pain Management:    Induction:   PONV Risk Score and Plan:   Airway Management Planned: Nasal Cannula  Additional Equipment:   Intra-op Plan:   Post-operative Plan:   Informed Consent: I have reviewed the patients History and Physical, chart, labs and discussed the procedure including the risks, benefits and alternatives for the proposed anesthesia with the patient or authorized representative who has indicated his/her understanding and acceptance.     Plan Discussed with:   Anesthesia Plan Comments:         Anesthesia Quick Evaluation

## 2018-06-15 NOTE — Anesthesia Post-op Follow-up Note (Signed)
Anesthesia QCDR form completed.        

## 2018-06-15 NOTE — Progress Notes (Signed)
Pt ready for surgery . All questions answered . She has reconfirmed her desire for BTL . UDS - neg . BG 102 .

## 2018-06-15 NOTE — Discharge Summary (Addendum)
Obstetric Discharge Summary   Patient ID: Patient Name: [name] DOB: [birth year] MRN: [id number]  Date of Admission: 06/15/2018 Date of Delivery:06/15/2018 Delivered WU:[id number]:Schermerhorn MD Date of Discharge: 06/17/18 [facility] AOZ:[id number]:Patient's last menstrual period was 08/15/2017 (exact date). EDC Estimated Date of Delivery: 06/20/18 at 1311 Gestational Age at Delivery: 39w0  Antepartum complications: remote heroin use, + tobacco use , A1gdm  Admitting Diagnosis: elective repeat C/S+ BTL  Secondary Diagnoses: Patient Active Problem List   Diagnosis Date Noted  . Delivery by elective cesarean section 06/15/2018  . Labor and delivery indication for care or intervention 06/10/2018  . Abdominal pain affecting pregnancy 03/12/2018  . Symptomatic cholelithiasis   . Acute cholecystitis 09/22/2016    Augmentation: None Complications: Anemia, Depression hx Intrapartum complications/course: none Delivery Type: repeat cesarean section, low transverse incision + BTL Anesthesia: spinal Placenta: manual Laceration: none Episiotomy: n/a  Newborn Data: Female infant with apgars of 9/10.     Postpartum Course  Patient had an uncomplicated postpartum course.  By time of discharge on POD#2, her pain was controlled on oral pain medications; she had appropriate lochia and was ambulating, voiding without difficulty and tolerating regular diet.  She was deemed stable for discharge to home.       Labs: CBC Latest Ref Rng & Units 06/14/2018 03/12/2018 09/25/2017  WBC 3.6 - 11.0 K/uL 13.6(H) 12.6(H) 15.1(H)  Hemoglobin 12.0 - 16.0 g/dL 10.9(L) 11.2(L) 12.6  Hematocrit 35.0 - 47.0 % 33.4(L) 33.9(L) 37.4  Platelets 150 - 440 K/uL 226 192 271   O NEG Performed at [facility], [address] [place], [place] [address]   Physical exam:  BP 124/78 (BP Location: Left Arm)   Pulse 99   Temp 97.8 F (36.6 C) (Oral)   Resp 16   Ht 5\' 4"  (1.626 m)   Wt 98.9 kg (218  lb)   LMP 08/15/2017 (Exact Date)   BMI 37.42 kg/m  General: alert and no distress Pulm: normal respiratory effort Lochia: appropriate Abdomen: soft, NT Uterine Fundus:U-2,  firm, below umbilicus Incision: c/d/i, healing well, no significant drainage, no dehiscence, no significant erythema Extremities: No evidence of DVT seen on physical exam. No lower extremity edema.   Disposition: stable, discharge to home Baby Feeding: breastmilk Baby Disposition: home with mom  Contraception: BTL completed  Prenatal Labs:   Oneg Rubella + varicella IMM Hep B neg RPR NR Hiv neg  + GBS   Rh Immune globulin given: n/a CONSULTS: SOCIAL SERVICES: pt was seen by social worker today due to hx of cocaine usage in pregnancy. Pt is currently not using cocaine and was using it due to coping. Pt does not want her mom to know but, her sister is aware. She has been clean since that day in the hospital when she was positive for cocaine. She committed to staying clean and she was cooperative in the pregnancy with her care. Plan:  [name] was discharged to home in good condition. [facility] with delivery provider in 2 weeks  Discharge Instructions: Per After Visit Summary. Activity: Advance as tolerated. Pelvic rest for 6 weeks.   Diet: Regular Discharge Medications: Percocet, Fe, PNV, Zoloft  Take over the counter iron supplements everyday for 6-12 weeks. Take Zoloft 50 mg daily for depression. Take Percocet per prescriptino. Take a prenatal vitamin everyday Outpatient follow up: 2 weeks [facility]  Signed:  Myrtie Cruisearon W. Jones,RN, MSN, CNM, FNP Certified Nurse  Midwife [facility]

## 2018-06-15 NOTE — Lactation Note (Signed)
Lactation Consultation Note  Patient Name: Rosalio MacadamiaMegan A Marini ZOXWR'UToday's Date: 06/15/2018 Reason for consult: Initial assessment   While reviewing breastfeeding I also basics with Mom, I also reviewed meds that are usually accepted as "compatible with breastfeeding" and those that are not, such as marijuana and cocaine. I asked her if she had any questions or concerns about anything regarding feeds and she said "no". Lots of assistance was needed to keep breast "sandwiched" for deep latch. He nursed quite vigorously once that happened. Lots of strong sucks and what looked like swallows (noisy room) for over 20 minutes   Maternal Data Does the patient have breastfeeding experience prior to this delivery?: Yes  Feeding Feeding Type: Breast Fed  LATCH Score Latch: Repeated attempts needed to sustain latch, nipple held in mouth throughout feeding, stimulation needed to elicit sucking reflex.  Audible Swallowing: A few with stimulation  Type of Nipple: Everted at rest and after stimulation("full" areola needs compression for deep latch)  Comfort (Breast/Nipple): Soft / non-tender  Hold (Positioning): Assistance needed to correctly position infant at breast and maintain latch.  LATCH Score: 7  Interventions Interventions: Breast feeding basics reviewed;Assisted with latch;Skin to skin;Breast compression;Adjust position;Support pillows;Position options  Lactation Tools Discussed/Used     Consult Status Consult Status: Follow-up Date: 06/16/18 Follow-up type: In-patient    Sunday CornSandra Clark Quame Spratlin 06/15/2018, 2:38 PM

## 2018-06-15 NOTE — Brief Op Note (Signed)
06/15/2018  1:54 PM  PATIENT:  [name]  32 y.o. female  PRE-OPERATIVE DIAGNOSIS:  elective repeat cesarean section, sterilization  POST-OPERATIVE DIAGNOSIS:  elective repeat cesarean section, sterilization  PROCEDURE:  Procedure(s): CESAREAN SECTION WITH BILATERAL TUBAL LIGATION (Bilateral)  SURGEON:  Surgeon(s) and Role:    * Meribeth Vitug, Ihor Austinhomas J, MD - Primary  PHYSICIAN ASSISTANT: [place] , CNM   ASSISTANTS: none   ANESTHESIA:   spinal  EBL:  300 mL   BLOOD ADMINISTERED:none  DRAINS: Urinary Catheter (Foley)   LOCAL MEDICATIONS USED:  MARCAINE    and BUPIVICAINE   SPECIMEN:  Source of Specimen:  portion R+L tube   DISPOSITION OF SPECIMEN:  PATHOLOGY  COUNTS:  YES  TOURNIQUET:  * No tourniquets in log *  DICTATION: .Other Dictation: Dictation Number verbal  PLAN OF CARE: Admit to inpatient   PATIENT DISPOSITION:  PACU - hemodynamically stable.   Delay start of Pharmacological VTE agent (>24hrs) due to surgical blood loss or risk of bleeding: not applicable

## 2018-06-16 ENCOUNTER — Encounter: Payer: Self-pay | Admitting: Obstetrics and Gynecology

## 2018-06-16 LAB — CBC
HCT: 28.8 % — ABNORMAL LOW (ref 35.0–47.0)
HEMOGLOBIN: 9.4 g/dL — AB (ref 12.0–16.0)
MCH: 26 pg (ref 26.0–34.0)
MCHC: 32.6 g/dL (ref 32.0–36.0)
MCV: 79.6 fL — ABNORMAL LOW (ref 80.0–100.0)
PLATELETS: 190 10*3/uL (ref 150–440)
RBC: 3.62 MIL/uL — AB (ref 3.80–5.20)
RDW: 15.1 % — ABNORMAL HIGH (ref 11.5–14.5)
WBC: 12.7 10*3/uL — AB (ref 3.6–11.0)

## 2018-06-16 NOTE — Anesthesia Post-op Follow-up Note (Signed)
  Anesthesia Pain Follow-up Note  Patient: [name]  Day #: 1  Date of Follow-up: 06/16/2018 Time: 8:32 AM  Last Vitals:  Vitals:   06/16/18 0459 06/16/18 0740  BP: 122/73 119/64  Pulse: 82 72  Resp: 20 19  Temp: 36.9 C 36.8 C  SpO2: 96% 99%    Level of Consciousness: alert  Pain: mild   Side Effects:None  Catheter Site Exam:clean, dry     Plan: D/C from anesthesia care at surgeon's request  Clydene PughBeane, Fread Kottke D

## 2018-06-16 NOTE — Anesthesia Postprocedure Evaluation (Signed)
Anesthesia Post Note  Patient: [name]  Procedure(s) Performed: CESAREAN SECTION WITH BILATERAL TUBAL LIGATION (Bilateral Abdomen)  Patient location during evaluation: Mother Baby Anesthesia Type: Spinal Level of consciousness: awake and alert and oriented Pain management: satisfactory to patient Vital Signs Assessment: post-procedure vital signs reviewed and stable Respiratory status: respiratory function stable Cardiovascular status: stable Postop Assessment: no backache, no headache, spinal receding, patient able to bend at knees, no apparent nausea or vomiting, able to ambulate and adequate PO intake Anesthetic complications: no     Last Vitals:  Vitals:   06/16/18 0459 06/16/18 0740  BP: 122/73 119/64  Pulse: 82 72  Resp: 20 19  Temp: 36.9 C 36.8 C  SpO2: 96% 99%    Last Pain:  Vitals:   06/16/18 0740  TempSrc: Oral  PainSc:                  Clydene PughBeane, [name]

## 2018-06-16 NOTE — Progress Notes (Signed)
Subjective: Postpartum Day 1: Cesarean Delivery Patient reports incisional pain.    Objective: Vital signs in last 24 hours: Temp:  [97.5 F (36.4 C)-98.4 F (36.9 C)] 97.5 F (36.4 C) (07/11 1514) Pulse Rate:  [72-82] 78 (07/11 1514) Resp:  [18-20] 20 (07/11 1514) BP: (117-128)/(64-77) 128/77 (07/11 1514) SpO2:  [96 %-99 %] 98 % (07/11 1514)  Physical Exam:  General: alert, cooperative and appears stated age Lochia: appropriate Uterine Fundus: firm Incision: healing well, no significant drainage, no dehiscence DVT Evaluation: No evidence of DVT seen on physical exam. Negative Homan's sign.  Recent Labs    06/14/18 0916 06/16/18 0646  HGB 10.9* 9.4*  HCT 33.4* 28.8*    Assessment/Plan: Status post Cesarean section. Doing well postoperatively.  Continue current care. Breastfeeding support May shower Iron def anemia: PO ferrous sulfate started  Christeen DouglasBethany Tanashia Ciesla 06/16/2018, 7:27 PM

## 2018-06-16 NOTE — Lactation Note (Signed)
This note was copied from a baby's chart. Lactation Consultation Note  Patient Name: Ariana Billy FischerMegan [name] [id number]'XToday's Date: 06/16/2018 Reason for consult: Initial assessment   Maternal Data    Feeding Feeding Type: Breast Fed Nipple Type: Regular  LATCH Score Latch: Grasps breast easily, tongue down, lips flanged, rhythmical sucking.  Audible Swallowing: A few with stimulation  Type of Nipple: Everted at rest and after stimulation  Comfort (Breast/Nipple): Soft / non-tender  Hold (Positioning): Assistance needed to correctly position infant at breast and maintain latch.  LATCH Score: 8  Interventions Interventions: Support pillows;Adjust position;Assisted with latch  Lactation Tools Discussed/Used     Consult Status Consult Status: Follow-up Date: 06/16/18 Follow-up type: In-patient  Mother was breastfeeding in the football hold when [name] arrived in room. Mother breast fed her older child for 2 weeks and states that she just wants to breastfeed infant "in the beginning stages" and switch to the bottle. Mother also states that infant was given formula to help get his blood sugar up. Mother states that her goal is to only breastfeed for 2 weeks.   [name] 06/16/2018, 12:59 PM

## 2018-06-17 LAB — SURGICAL PATHOLOGY

## 2018-06-17 MED ORDER — SERTRALINE HCL 50 MG PO TABS: 50.0000 mg | ORAL_TABLET | Freq: Every day | ORAL | 11 refills | Status: DC

## 2018-06-17 MED ORDER — OXYCODONE-ACETAMINOPHEN 5-325 MG PO TABS: 1.0000 | ORAL_TABLET | ORAL | 0 refills | Status: DC | PRN

## 2018-06-17 NOTE — Discharge Instructions (Signed)
Anemia Anemia is a condition in which you do not have enough red blood cells or hemoglobin. Hemoglobin is a substance in red blood cells that carries oxygen. When you do not have enough red blood cells or hemoglobin (are anemic), your body cannot get enough oxygen and your organs may not work properly. As a result, you may feel very tired or have other problems. What are the causes? Common causes of anemia include:  Excessive bleeding. Anemia can be caused by excessive bleeding inside or outside the body, including bleeding from the intestine or from periods in women.  Poor nutrition.  Long-lasting (chronic) kidney, thyroid, and liver disease.  Bone marrow disorders.  Cancer and treatments for cancer.  HIV (human immunodeficiency virus) and AIDS (acquired immunodeficiency syndrome).  Treatments for HIV and AIDS.  Spleen problems.  Blood disorders.  Infections, medicines, and autoimmune disorders that destroy red blood cells.  What are the signs or symptoms? Symptoms of this condition include:  Minor weakness.  Dizziness.  Headache.  Feeling heartbeats that are irregular or faster than normal (palpitations).  Shortness of breath, especially with exercise.  Paleness.  Cold sensitivity.  Indigestion.  Nausea.  Difficulty sleeping.  Difficulty concentrating.  Symptoms may occur suddenly or develop slowly. If your anemia is mild, you may not have symptoms. How is this diagnosed? This condition is diagnosed based on:  Blood tests.  Your medical history.  A physical exam.  Bone marrow biopsy.  Your health care provider may also check your stool (feces) for blood and may do additional testing to look for the cause of your bleeding. You may also have other tests, including:  Imaging tests, such as a CT scan or MRI.  Endoscopy.  Colonoscopy.  How is this treated? Treatment for this condition depends on the cause. If you continue to lose a lot of blood,  you may need to be treated at a hospital. Treatment may include:  Taking supplements of iron, vitamin T02, or folic acid.  Taking a hormone medicine (erythropoietin) that can help to stimulate red blood cell growth.  Having a blood transfusion. This may be needed if you lose a lot of blood.  Making changes to your diet.  Having surgery to remove your spleen.  Follow these instructions at home:  Take over-the-counter and prescription medicines only as told by your health care provider.  Take supplements only as told by your health care provider.  Follow any diet instructions that you were given.  Keep all follow-up visits as told by your health care provider. This is important. Contact a health care provider if:  You develop new bleeding anywhere in the body. Get help right away if:  You are very weak.  You are short of breath.  You have pain in your abdomen or chest.  You are dizzy or feel faint.  You have trouble concentrating.  You have bloody or black, tarry stools.  You vomit repeatedly or you vomit up blood. Summary  Anemia is a condition in which you do not have enough red blood cells or enough of a substance in your red blood cells that carries oxygen (hemoglobin).  Symptoms may occur suddenly or develop slowly.  If your anemia is mild, you may not have symptoms.  This condition is diagnosed with blood tests as well as a medical history and physical exam. Other tests may be needed.  Treatment for this condition depends on the cause of the anemia. This information is not intended to replace advice  given to you by your health care provider. Make sure you discuss any questions you have with your health care provider. Document Released: 12/31/2004 Document Revised: 12/25/2016 Document Reviewed: 12/25/2016 Elsevier Interactive Patient Education  2018 [employer]. Breastfeeding Choosing to breastfeed is one of the best decisions you can make for yourself and  your baby. A change in hormones during pregnancy causes your breasts to make breast milk in your milk-producing glands. Hormones prevent breast milk from being released before your baby is born. They also prompt milk flow after birth. Once breastfeeding has begun, thoughts of your baby, as well as his or her sucking or crying, can stimulate the release of milk from your milk-producing glands. Benefits of breastfeeding Research shows that breastfeeding offers many health benefits for infants and mothers. It also offers a cost-free and convenient way to feed your baby. For your baby  Your first milk (colostrum) helps your baby's digestive system to function better.  Special cells in your milk (antibodies) help your baby to fight off infections.  Breastfed babies are less likely to develop asthma, allergies, obesity, or type 2 diabetes. They are also at lower risk for sudden infant death syndrome (SIDS).  Nutrients in breast milk are better able to meet your babys needs compared to infant formula.  Breast milk improves your baby's brain development. For you  Breastfeeding helps to create a very special bond between you and your baby.  Breastfeeding is convenient. Breast milk costs nothing and is always available at the correct temperature.  Breastfeeding helps to burn calories. It helps you to lose the weight that you gained during pregnancy.  Breastfeeding makes your uterus return faster to its size before pregnancy. It also slows bleeding (lochia) after you give birth.  Breastfeeding helps to lower your risk of developing type 2 diabetes, osteoporosis, rheumatoid arthritis, cardiovascular disease, and breast, ovarian, uterine, and endometrial cancer later in life. Breastfeeding basics Starting breastfeeding  Find a comfortable place to sit or lie down, with your neck and back well-supported.  Place a pillow or a rolled-up blanket under your baby to bring him or her to the level of your  breast (if you are seated). Nursing pillows are specially designed to help support your arms and your baby while you breastfeed.  Make sure that your baby's tummy (abdomen) is facing your abdomen.  Gently massage your breast. With your fingertips, massage from the outer edges of your breast inward toward the nipple. This encourages milk flow. If your milk flows slowly, you may need to continue this action during the feeding.  Support your breast with 4 fingers underneath and your thumb above your nipple (make the letter "C" with your hand). Make sure your fingers are well away from your nipple and your babys mouth.  Stroke your baby's lips gently with your finger or nipple.  When your baby's mouth is open wide enough, quickly bring your baby to your breast, placing your entire nipple and as much of the areola as possible into your baby's mouth. The areola is the colored area around your nipple. ? More areola should be visible above your baby's upper lip than below the lower lip. ? Your baby's lips should be opened and extended outward (flanged) to ensure an adequate, comfortable latch. ? Your baby's tongue should be between his or her lower gum and your breast.  Make sure that your baby's mouth is correctly positioned around your nipple (latched). Your baby's lips should create a seal on your breast  and be turned out (everted).  It is common for your baby to suck about 2-3 minutes in order to start the flow of breast milk. Latching Teaching your baby how to latch onto your breast properly is very important. An improper latch can cause nipple pain, decreased milk supply, and poor weight gain in your baby. Also, if your baby is not latched onto your nipple properly, he or she may swallow some air during feeding. This can make your baby fussy. Burping your baby when you switch breasts during the feeding can help to get rid of the air. However, teaching your baby to latch on properly is still the  best way to prevent fussiness from swallowing air while breastfeeding. Signs that your baby has successfully latched onto your nipple  Silent tugging or silent sucking, without causing you pain. Infant's lips should be extended outward (flanged).  Swallowing heard between every 3-4 sucks once your milk has started to flow (after your let-down milk reflex occurs).  Muscle movement above and in front of his or her ears while sucking.  Signs that your baby has not successfully latched onto your nipple  Sucking sounds or smacking sounds from your baby while breastfeeding.  Nipple pain.  If you think your baby has not latched on correctly, slip your finger into the corner of your babys mouth to break the suction and place it between your baby's gums. Attempt to start breastfeeding again. Signs of successful breastfeeding Signs from your baby  Your baby will gradually decrease the number of sucks or will completely stop sucking.  Your baby will fall asleep.  Your baby's body will relax.  Your baby will retain a small amount of milk in his or her mouth.  Your baby will let go of your breast by himself or herself.  Signs from you  Breasts that have increased in firmness, weight, and size 1-3 hours after feeding.  Breasts that are softer immediately after breastfeeding.  Increased milk volume, as well as a change in milk consistency and color by the fifth day of breastfeeding.  Nipples that are not sore, cracked, or bleeding.  Signs that your baby is getting enough milk  Wetting at least 1-2 diapers during the first 24 hours after birth.  Wetting at least 5-6 diapers every 24 hours for the first week after birth. The urine should be clear or pale yellow by the age of 5 days.  Wetting 6-8 diapers every 24 hours as your baby continues to grow and develop.  At least 3 stools in a 24-hour period by the age of 5 days. The stool should be soft and yellow.  At least 3 stools in a  24-hour period by the age of 7 days. The stool should be seedy and yellow.  No loss of weight greater than 10% of birth weight during the first 3 days of life.  Average weight gain of 4-7 oz (113-198 g) per week after the age of 4 days.  Consistent daily weight gain by the age of 5 days, without weight loss after the age of 2 weeks. After a feeding, your baby may spit up a small amount of milk. This is normal. Breastfeeding frequency and duration Frequent feeding will help you make more milk and can prevent sore nipples and extremely full breasts (breast engorgement). Breastfeed when you feel the need to reduce the fullness of your breasts or when your baby shows signs of hunger. This is called "breastfeeding on demand." Signs that your  baby is hungry include:  Increased alertness, activity, or restlessness.  Movement of the head from side to side.  Opening of the mouth when the corner of the mouth or cheek is stroked (rooting).  Increased sucking sounds, smacking lips, cooing, sighing, or squeaking.  Hand-to-mouth movements and sucking on fingers or hands.  Fussing or crying.  Avoid introducing a pacifier to your baby in the first 4-6 weeks after your baby is born. After this time, you may choose to use a pacifier. Research has shown that pacifier use during the first year of a baby's life decreases the risk of sudden infant death syndrome (SIDS). Allow your baby to feed on each breast as long as he or she wants. When your baby unlatches or falls asleep while feeding from the first breast, offer the second breast. Because newborns are often sleepy in the first few weeks of life, you may need to awaken your baby to get him or her to feed. Breastfeeding times will vary from baby to baby. However, the following rules can serve as a guide to help you make sure that your baby is properly fed:  Newborns (babies 47 weeks of age or younger) may breastfeed every 1-3 hours.  Newborns should not go  without breastfeeding for longer than 3 hours during the day or 5 hours during the night.  You should breastfeed your baby a minimum of 8 times in a 24-hour period.  Breast milk pumping Pumping and storing breast milk allows you to make sure that your baby is exclusively fed your breast milk, even at times when you are unable to breastfeed. This is especially important if you go back to work while you are still breastfeeding, or if you are not able to be present during feedings. Your lactation consultant can help you find a method of pumping that works best for you and give you guidelines about how long it is safe to store breast milk. Caring for your breasts while you breastfeed Nipples can become dry, cracked, and sore while breastfeeding. The following recommendations can help keep your breasts moisturized and healthy:  Avoid using soap on your nipples.  Wear a supportive bra designed especially for nursing. Avoid wearing underwire-style bras or extremely tight bras (sports bras).  Air-dry your nipples for 3-4 minutes after each feeding.  Use only cotton bra pads to absorb leaked breast milk. Leaking of breast milk between feedings is normal.  Use lanolin on your nipples after breastfeeding. Lanolin helps to maintain your skin's normal moisture barrier. Pure lanolin is not harmful (not toxic) to your baby. You may also hand express a few drops of breast milk and gently massage that milk into your nipples and allow the milk to air-dry.  In the first few weeks after giving birth, some women experience breast engorgement. Engorgement can make your breasts feel heavy, warm, and tender to the touch. Engorgement peaks within 3-5 days after you give birth. The following recommendations can help to ease engorgement:  Completely empty your breasts while breastfeeding or pumping. You may want to start by applying warm, moist heat (in the shower or with warm, water-soaked hand towels) just before  feeding or pumping. This increases circulation and helps the milk flow. If your baby does not completely empty your breasts while breastfeeding, pump any extra milk after he or she is finished.  Apply ice packs to your breasts immediately after breastfeeding or pumping, unless this is too uncomfortable for you. To do this: ? Put ice  in a plastic bag. ? Place a towel between your skin and the bag. ? Leave the ice on for 20 minutes, 2-3 times a day.  Make sure that your baby is latched on and positioned properly while breastfeeding.  If engorgement persists after 48 hours of following these recommendations, contact your health care provider or a Science writer. Overall health care recommendations while breastfeeding  Eat 3 healthy meals and 3 snacks every day. Well-nourished mothers who are breastfeeding need an additional 450-500 calories a day. You can meet this requirement by increasing the amount of a balanced diet that you eat.  Drink enough water to keep your urine pale yellow or clear.  Rest often, relax, and continue to take your prenatal vitamins to prevent fatigue, stress, and low vitamin and mineral levels in your body (nutrient deficiencies).  Do not use any products that contain nicotine or tobacco, such as cigarettes and e-cigarettes. Your baby may be harmed by chemicals from cigarettes that pass into breast milk and exposure to secondhand smoke. If you need help quitting, ask your health care provider.  Avoid alcohol.  Do not use illegal drugs or marijuana.  Talk with your health care provider before taking any medicines. These include over-the-counter and prescription medicines as well as vitamins and herbal supplements. Some medicines that may be harmful to your baby can pass through breast milk.  It is possible to become pregnant while breastfeeding. If birth control is desired, ask your health care provider about options that will be safe while breastfeeding your  baby. Where to find more information: [employer]: www.llli.org Contact a health care provider if:  You feel like you want to stop breastfeeding or have become frustrated with breastfeeding.  Your nipples are cracked or bleeding.  Your breasts are red, tender, or warm.  You have: ? Painful breasts or nipples. ? A swollen area on either breast. ? A fever or chills. ? Nausea or vomiting. ? Drainage other than breast milk from your nipples.  Your breasts do not become full before feedings by the fifth day after you give birth.  You feel sad and depressed.  Your baby is: ? Too sleepy to eat well. ? Having trouble sleeping. ? More than 15 week old and wetting fewer than 6 diapers in a 24-hour period. ? Not gaining weight by 38 days of age.  Your baby has fewer than 3 stools in a 24-hour period.  Your baby's skin or the white parts of his or her eyes become yellow. Get help right away if:  Your baby is overly tired (lethargic) and does not want to wake up and feed.  Your baby develops an unexplained fever. Summary  Breastfeeding offers many health benefits for infant and mothers.  Try to breastfeed your infant when he or she shows early signs of hunger.  Gently tickle or stroke your baby's lips with your finger or nipple to allow the baby to open his or her mouth. Bring the baby to your breast. Make sure that much of the areola is in your baby's mouth. Offer one side and burp the baby before you offer the other side.  Talk with your health care provider or lactation consultant if you have questions or you face problems as you breastfeed. This information is not intended to replace advice given to you by your health care provider. Make sure you discuss any questions you have with your health care provider. Document Released: 11/23/2005 Document Revised: 12/25/2016 Document Reviewed:  12/25/2016 Elsevier Interactive Patient Education  2018 [place]  With Depression Everyone experiences occasional disappointment, sadness, and loss in their lives. When you are feeling down, blue, or sad for at least 2 weeks in a row, it may mean that you have depression. Depression can affect your thoughts and feelings, relationships, daily activities, and physical health. It is caused by changes in the way your brain functions. If you receive a diagnosis of depression, your health care provider will tell you which type of depression you have and what treatment options are available to you. If you are living with depression, there are ways to help you recover from it and also ways to prevent it from coming back. How to cope with lifestyle changes Coping with stress Stress is your bodys reaction to life changes and events, both good and bad. Stressful situations may include:  Getting married.  The death of a spouse.  Losing a job.  Retiring.  Having a baby.  Stress can last just a few hours or it can be ongoing. Stress can play a major role in depression, so it is important to learn both how to cope with stress and how to think about it differently. Talk with your health care provider or a counselor if you would like to learn more about stress reduction. He or she may suggest some stress reduction techniques, such as:  Music therapy. This can include creating music or listening to music. Choose music that you enjoy and that inspires you.  Mindfulness-based meditation. This kind of meditation can be done while sitting or walking. It involves being aware of your normal breaths, rather than trying to control your breathing.  Centering prayer. This is a kind of meditation that involves focusing on a spiritual word or phrase. Choose a word, phrase, or sacred image that is meaningful to you and that brings you peace.  Deep breathing. To do this, expand your stomach and inhale slowly through your nose. Hold your breath for 3-5 seconds, then exhale slowly,  allowing your stomach muscles to relax.  Muscle relaxation. This involves intentionally tensing muscles then relaxing them.  Choose a stress reduction technique that fits your lifestyle and personality. Stress reduction techniques take time and practice to develop. Set aside 5-15 minutes a day to do them. Therapists can offer training in these techniques. The training may be covered by some insurance plans. Other things you can do to manage stress include:  Keeping a stress diary. This can help you learn what triggers your stress and ways to control your response.  Understanding what your limits are and saying no to requests or events that lead to a schedule that is too full.  Thinking about how you respond to certain situations. You may not be able to control everything, but you can control how you react.  Adding humor to your life by watching funny films or TV shows.  Making time for activities that help you relax and not feeling guilty about spending your time this way.  Medicines Your health care provider may suggest certain medicines if he or she feels that they will help improve your condition. Avoid using alcohol and other substances that may prevent your medicines from working properly (may interact). It is also important to:  Talk with your pharmacist or health care provider about all the medicines that you take, their possible side effects, and what medicines are safe to take together.  Make it your goal to take part in all treatment  decisions (shared decision-making). This includes giving input on the side effects of medicines. It is best if shared decision-making with your health care provider is part of your total treatment plan.  If your health care provider prescribes a medicine, you may not notice the full benefits of it for 4-8 weeks. Most people who are treated for depression need to be on medicine for at least 6-12 months after they feel better. If you are taking medicines  as part of your treatment, do not stop taking medicines without first talking to your health care provider. You may need to have the medicine slowly decreased (tapered) over time to decrease the risk of harmful side effects. Relationships Your health care provider may suggest family therapy along with individual therapy and drug therapy. While there may not be family problems that are causing you to feel depressed, it is still important to make sure your family learns as much as they can about your mental health. Having your familys support can help make your treatment successful. How to recognize changes in your condition Everyone has a different response to treatment for depression. Recovery from major depression happens when you have not had signs of major depression for two months. This may mean that you will start to:  Have more interest in doing activities.  Feel less hopeless than you did 2 months ago.  Have more energy.  Overeat less often, or have better or improving appetite.  Have better concentration.  Your health care provider will work with you to decide the next steps in your recovery. It is also important to recognize when your condition is getting worse. Watch for these signs:  Having fatigue or low energy.  Eating too much or too little.  Sleeping too much or too little.  Feeling restless, agitated, or hopeless.  Having trouble concentrating or making decisions.  Having unexplained physical complaints.  Feeling irritable, angry, or aggressive.  Get help as soon as you or your family members notice these symptoms coming back. How to get support and help from others How to talk with friends and family members about your condition Talking to friends and family members about your condition can provide you with one way to get support and guidance. Reach out to trusted friends or family members, explain your symptoms to them, and let them know that you are working with  a health care provider to treat your depression. Financial resources Not all insurance plans cover mental health care, so it is important to check with your insurance carrier. If paying for co-pays or counseling services is a problem, search for a local or county mental health care center. They may be able to offer public mental health care services at low or no cost when you are not able to see a private health care provider. If you are taking medicine for depression, you may be able to get the generic form, which may be less expensive. Some makers of prescription medicines also offer help to patients who cannot afford the medicines they need. Follow these instructions at home:  Get the right amount and quality of sleep.  Cut down on using caffeine, tobacco, alcohol, and other potentially harmful substances.  Try to exercise, such as walking or lifting small weights.  Take over-the-counter and prescription medicines only as told by your health care provider.  Eat a healthy diet that includes plenty of vegetables, fruits, whole grains, low-fat dairy products, and lean protein. Do not eat a lot of foods that  are high in solid fats, added sugars, or salt.  Keep all follow-up visits as told by your health care provider. This is important. Contact a health care provider if:  You stop taking your antidepressant medicines, and you have any of these symptoms: ? Nausea. ? Headache. ? Feeling lightheaded. ? Chills and body aches. ? Not being able to sleep (insomnia).  You or your friends and family think your depression is getting worse. Get help right away if:  You have thoughts of hurting yourself or others. If you ever feel like you may hurt yourself or others, or have thoughts about taking your own life, get help right away. You can go to your nearest emergency department or call:  Your local emergency services (911 in the [place]  A suicide crisis helpline, such as the [place] at 830-468-1863. This is open 24-hours a day.  Summary  If you are living with depression, there are ways to help you recover from it and also ways to prevent it from coming back.  Work with your health care team to create a management plan that includes counseling, stress management techniques, and healthy lifestyle habits. This information is not intended to replace advice given to you by your health care provider. Make sure you discuss any questions you have with your health care provider. Document Released: 10/26/2016 Document Revised: 10/26/2016 Document Reviewed: 10/26/2016 Elsevier Interactive Patient Education  2018 [employer].  Call your doctor for increased pain or vaginal bleeding, temperature above 100.4, depression, or concerns.  Keep incision clean and dry.  Call your doctor for incision concerns including redness, swelling, bleeding or drainage, or if begins to come apart.  Please follow instructions included in hygiene kit.  No strenuous activity or heavy lifting for 6 weeks.  No intercourse, tampons, douching, or enemas for 6 weeks.  No tub baths- showers only.  No driving for 2 weeks or while taking pain medications.

## 2018-06-17 NOTE — Plan of Care (Signed)
Vs stable; up ad lib; able to ambulate on (and sometimes off) the unit; takes percocet and motrin for pain control; tolerating regular diet; breastfeeding mostly; occasionally offers formula

## 2018-06-17 NOTE — Progress Notes (Signed)
Discharge instructions provided.  Pt verbalizes understanding of all instructions and follow-up care.  Prescription given.  Pt discharged to home with infant at 1311 on 06/17/18 via wheelchair by RN. Reynold BowenSusan Paisley Caiden Arteaga, RN 06/17/2018 1:23 PM

## 2018-06-17 NOTE — Clinical Social Work Maternal (Signed)
CLINICAL SOCIAL WORK MATERNAL/CHILD NOTE  Patient Details  Name: [name] MRN: [id number] Date of Birth: 16-Mar-1986  Date:  06/17/2018  Clinical Social Worker Initiating Note:  Shela Leff MSW,LCSW Date/Time: Initiated:  06/17/18/      Child's Name:      Biological Parents:  Mother   Need for Interpreter:  None   Reason for Referral:      Address:  [place] [address]    Phone number:  [phone] (home)     Additional phone number: none  Household Members/Support Persons (HM/SP):       HM/SP Name Relationship DOB or Age  HM/SP -1        HM/SP -2        HM/SP -3        HM/SP -4        HM/SP -5        HM/SP -6        HM/SP -7        HM/SP -8          Natural Supports (not living in the home):  Immediate Family   Professional Supports:     Employment:     Type of Work:     Education:      Homebound arranged:    Museum/gallery curator Resources:  Medicaid   Other Resources:      Cultural/Religious Considerations Which May Impact Care:  none  Strengths:  Ability to meet basic needs , Home prepared for child    Psychotropic Medications:         Pediatrician:       Pediatrician List:   [place]      Pediatrician Fax Number:    Risk Factors/Current Problems:  Substance Use    Cognitive State:  Alert , Able to Concentrate    Mood/Affect:  Calm , Irritable    CSW Assessment: CSW consulted to see patient due to history of drug use. CSW met with patient who was holding her newborn and patient's sister was at bedside. Patient allowed CSW to conduct assessment with her sister present. CSW spoke with patient's nurse prior to speaking with patient and there was some concern of last night that patient was not allowing staff to touch her newborn to place him from his stomach to his back. CSW spoke with patient about this and she denied  her newborn was sleeping on his stomach but rather was sleeping on his side. She stated that they were having her newborn sleep on his side because he would not spit up as much while on his side and he would sleep better.   Patient informed CSW that she has another child in the home with her. She states she has all necessities for her newborn and relies on medicaid transportation. She has family support. Patient's sister confirmed this. Patient admitted to using cocaine during the pregnancy but states she only used twice. When asked why, she replied it was due to her depression. Patient states that her physician placed her on zoloft and it has been working well. CSW explained that her newborn's cord tissue had been sent off to the lab and that if it returns positive for any illicit substances, that a Child Protective Report would be made. She verbalized understanding.   CSW Plan/Description:  CSW Will Continue to Monitor  Umbilical Cord Tissue Drug Screen Results and Make Report if [facility], LCSW 06/17/2018, 11:04 AM

## 2018-07-10 ENCOUNTER — Encounter: Payer: Self-pay | Admitting: Emergency Medicine

## 2018-07-10 ENCOUNTER — Emergency Department: Payer: Medicaid Other

## 2018-07-10 ENCOUNTER — Emergency Department
Admission: EM | Admit: 2018-07-10 | Discharge: 2018-07-10 | Disposition: A | Discharge status: Home / Self Care | Payer: Medicaid Other | Attending: Student in an Organized Health Care Education/Training Program | Admitting: Student in an Organized Health Care Education/Training Program

## 2018-07-10 ENCOUNTER — Other Ambulatory Visit: Payer: Self-pay

## 2018-07-10 DIAGNOSIS — J069 Acute upper respiratory infection, unspecified: Secondary | ICD-10-CM | POA: Insufficient documentation

## 2018-07-10 DIAGNOSIS — R0981 Nasal congestion: Secondary | ICD-10-CM | POA: Diagnosis present

## 2018-07-10 DIAGNOSIS — F17210 Nicotine dependence, cigarettes, uncomplicated: Secondary | ICD-10-CM | POA: Diagnosis not present

## 2018-07-10 DIAGNOSIS — Z79899 Other long term (current) drug therapy: Secondary | ICD-10-CM | POA: Diagnosis not present

## 2018-07-10 LAB — INFLUENZA PANEL BY PCR (TYPE A & B)
Influenza A By PCR: NEGATIVE
Influenza B By PCR: NEGATIVE

## 2018-07-10 NOTE — ED Provider Notes (Addendum)
[facility] Emergency Department Provider Note    First MD Initiated Contact with Patient 07/10/18 1611     (approximate)  I have reviewed the triage vital signs and the nursing notes.   HISTORY  Chief Complaint URI    HPI [name] is a 32 y.o. female presents to the ER for evaluation of her newborn son's fever but is also been having nasal congestion for the past 2 days as well as some tightness in her chest and cough.  No measured fevers at home.  No nausea or vomiting.  Is otherwise been doing well postpartum..  Denies any abdominal pain dysuria or flank pain.    Past Medical History:  Diagnosis Date  . Gestational diabetes   . Headache   . Heroin abuse (HCC)    Family History  Problem Relation Age of Onset  . Cancer Mother        skin   Past Surgical History:  Procedure Laterality Date  . ANKLE SURGERY Left 2012  . CESAREAN SECTION  2011  . CESAREAN SECTION WITH BILATERAL TUBAL LIGATION Bilateral 06/15/2018   Procedure: CESAREAN SECTION WITH BILATERAL TUBAL LIGATION;  Surgeon: Suzy Bouchard, MD;  Location: [facility] ORS;  Service: Obstetrics;  Laterality: Bilateral;  . CHOLECYSTECTOMY N/A 09/27/2016   Procedure: LAPAROSCOPIC CHOLECYSTECTOMY WITH INTRAOPERATIVE CHOLANGIOGRAM;  Surgeon: Lattie Haw, MD;  Location: [facility] ORS;  Service: General;  Laterality: N/A;  . DILATION AND CURETTAGE OF UTERUS  2013  . TONSILLECTOMY  2007   Patient Active Problem List   Diagnosis Date Noted  . Delivery by elective cesarean section 06/15/2018  . Postoperative state 06/15/2018  . Labor and delivery indication for care or intervention 06/10/2018  . Abdominal pain affecting pregnancy 03/12/2018  . Symptomatic cholelithiasis   . Acute cholecystitis 09/22/2016      Prior to Admission medications   Medication Sig Start Date End Date Taking? Authorizing Provider  Prenatal Vit-Fe Fumarate-FA (MULTIVITAMIN-PRENATAL) 27-0.8 MG TABS tablet  Take 1 tablet by mouth daily.    Yes [provider]  sertraline (ZOLOFT) 50 MG tablet Take 1 tablet (50 mg total) by mouth daily. 06/17/18 06/17/19 Yes Sharee Pimple, CNM  oxyCODONE-acetaminophen (PERCOCET/ROXICET) 5-325 MG tablet Take 1-2 tablets by mouth every 4 (four) hours as needed (pain scale 4-7). 06/17/18   Sharee Pimple, CNM    Allergies Clindamycin/lincomycin and Amoxicillin    Social History Social History   Tobacco Use  . Smoking status: Current Every Day Smoker    Packs/day: 0.50    Years: 13.00    Pack years: 6.50    Types: Cigarettes  . Smokeless tobacco: Never Used  Substance Use Topics  . Alcohol use: No  . Drug use: Yes    Types: Heroin, Cocaine    Review of Systems Patient denies headaches, rhinorrhea, blurry vision, numbness, shortness of breath, chest pain, edema, cough, abdominal pain, nausea, vomiting, diarrhea, dysuria, fevers, rashes or hallucinations unless otherwise stated above in HPI. ____________________________________________   PHYSICAL EXAM:  VITAL SIGNS: Vitals:   07/10/18 1537  BP: 127/73  Pulse: 73  Resp: 16  Temp: 98.3 F (36.8 C)  SpO2: 99%    Constitutional: Alert and oriented.  Eyes: Conjunctivae are normal.  Head: Atraumatic. Nose: No congestion/rhinnorhea. Mouth/Throat: Mucous membranes are moist.   Neck: No stridor. Painless ROM.  Cardiovascular: Normal rate, regular rhythm. Grossly normal heart sounds.  Good peripheral circulation. Respiratory: Normal respiratory effort.  No retractions. Lungs CTAB. Gastrointestinal: Soft  and nontender. No distention. No abdominal bruits. No CVA tenderness. Genitourinary: deferred Musculoskeletal: No lower extremity tenderness nor edema.  No joint effusions. Neurologic:  Normal speech and language. No gross focal neurologic deficits are appreciated. No facial droop Skin:  Skin is warm, dry and intact. No rash noted. Psychiatric: Mood and affect are normal. Speech and  behavior are normal.  ____________________________________________   LABS (all labs ordered are listed, but only abnormal results are displayed)  Results for orders placed or performed during the hospital encounter of 07/10/18 (from the past 24 hour(s))  Influenza panel by PCR (type A & B)     Status: None   Collection Time: 07/10/18  4:45 PM  Result Value Ref Range   Influenza A By PCR NEGATIVE NEGATIVE   Influenza B By PCR NEGATIVE NEGATIVE   ____________________________________________  ED ECG REPORT I, Willy EddyPatrick Treyveon Mochizuki, the attending physician, personally viewed and interpreted this ECG.   Date: 07/10/2018  EKG Time: 15:46  Rate: 70  Rhythm: normal sinus  Axis: normal  Intervals:normal intervals  ST&T Change: no stemi ____________________________________________  RADIOLOGY  I personally reviewed all radiographic images ordered to evaluate for the above acute complaints and reviewed radiology reports and findings.  These findings were personally discussed with the patient.  Please see medical record for radiology report.  ____________________________________________   PROCEDURES  Procedure(s) performed:  Procedures    Critical Care performed: no ____________________________________________   INITIAL IMPRESSION / ASSESSMENT AND PLAN / ED COURSE  Pertinent labs & imaging results that were available during my care of the patient were reviewed by me and considered in my medical decision making (see chart for details).   DDX: uri, flu, pna, sinusitis  [name] is a 32 y.o. who presents to the ED with symptoms as described above.  Afebrile and clinically well.  Flu negative.  Probable viral URI.  No evidence of pneumonia.  Stable and appropriate for outpatient follow-up.      As part of my medical decision making, I reviewed the following data within the electronic MEDICAL RECORD NUMBER Nursing notes reviewed and incorporated, Labs reviewed, notes from prior  ED visits.   ____________________________________________   FINAL CLINICAL IMPRESSION(S) / ED DIAGNOSES  Final diagnoses:  Viral upper respiratory tract infection      NEW MEDICATIONS STARTED DURING THIS VISIT:  New Prescriptions   No medications on file     Note:  This document was prepared using Dragon voice recognition software and may include unintentional dictation errors.    Willy Eddyobinson, Trendon Zaring, MD 07/10/18 Ovidio [id number]    Willy Eddyobinson, Jettie Lazare, MD 07/10/18 [phone]

## 2018-07-10 NOTE — ED Notes (Signed)
Patient is sitting with her 493-week old son who is also a patient. No dyspnea, cough or distress noted.

## 2018-07-10 NOTE — ED Triage Notes (Signed)
Pt in via POV with complaints of congestion x 2 days.  Vitals WDL, ambulatory to triage, NAD noted at this time.

## 2018-07-10 NOTE — ED Notes (Signed)
Child has fever -  Symptoms  Today  Congestion   Runny  Nose  Symptoms x 2 days

## 2018-11-22 ENCOUNTER — Other Ambulatory Visit: Payer: Self-pay

## 2018-11-22 ENCOUNTER — Emergency Department
Admission: EM | Admit: 2018-11-22 | Discharge: 2018-11-22 | Disposition: A | Discharge status: Home / Self Care | Payer: Medicaid Other | Attending: Emergency Medicine | Admitting: Emergency Medicine

## 2018-11-22 ENCOUNTER — Encounter: Payer: Self-pay | Admitting: Emergency Medicine

## 2018-11-22 ENCOUNTER — Emergency Department: Payer: Medicaid Other

## 2018-11-22 DIAGNOSIS — R600 Localized edema: Secondary | ICD-10-CM | POA: Insufficient documentation

## 2018-11-22 DIAGNOSIS — Z79899 Other long term (current) drug therapy: Secondary | ICD-10-CM | POA: Diagnosis not present

## 2018-11-22 DIAGNOSIS — R05 Cough: Secondary | ICD-10-CM | POA: Diagnosis present

## 2018-11-22 DIAGNOSIS — F17210 Nicotine dependence, cigarettes, uncomplicated: Secondary | ICD-10-CM | POA: Diagnosis not present

## 2018-11-22 DIAGNOSIS — R609 Edema, unspecified: Secondary | ICD-10-CM

## 2018-11-22 LAB — CBC
HCT: 33.2 % — ABNORMAL LOW (ref 36.0–46.0)
HEMOGLOBIN: 10.2 g/dL — AB (ref 12.0–15.0)
MCH: 23.3 pg — ABNORMAL LOW (ref 26.0–34.0)
MCHC: 30.7 g/dL (ref 30.0–36.0)
MCV: 75.8 fL — ABNORMAL LOW (ref 80.0–100.0)
NRBC: 0 % (ref 0.0–0.2)
Platelets: 297 10*3/uL (ref 150–400)
RBC: 4.38 MIL/uL (ref 3.87–5.11)
RDW: 16.3 % — ABNORMAL HIGH (ref 11.5–15.5)
WBC: 8.7 10*3/uL (ref 4.0–10.5)

## 2018-11-22 LAB — BASIC METABOLIC PANEL
ANION GAP: 8 (ref 5–15)
BUN: 7 mg/dL (ref 6–20)
CO2: 23 mmol/L (ref 22–32)
Calcium: 8.5 mg/dL — ABNORMAL LOW (ref 8.9–10.3)
Chloride: 104 mmol/L (ref 98–111)
Creatinine, Ser: 0.72 mg/dL (ref 0.44–1.00)
Glucose, Bld: 126 mg/dL — ABNORMAL HIGH (ref 70–99)
POTASSIUM: 3.4 mmol/L — AB (ref 3.5–5.1)
SODIUM: 135 mmol/L (ref 135–145)

## 2018-11-22 LAB — TROPONIN I

## 2018-11-22 MED ORDER — GUAIFENESIN-CODEINE 100-10 MG/5ML PO SOLN: 5.0000 mL | Freq: Four times a day (QID) | ORAL | 0 refills | Status: DC | PRN

## 2018-11-22 MED ORDER — FUROSEMIDE 40 MG PO TABS
40.0000 mg | ORAL_TABLET | Freq: Once | ORAL | Status: AC
  Administered 2018-11-22: 40 mg via ORAL
  Filled 2018-11-22: qty 1

## 2018-11-22 MED ORDER — AZITHROMYCIN 500 MG PO TABS
500.0000 mg | ORAL_TABLET | Freq: Once | ORAL | Status: AC
  Administered 2018-11-22: 500 mg via ORAL
  Filled 2018-11-22: qty 1

## 2018-11-22 MED ORDER — HYDROCOD POLST-CPM POLST ER 10-8 MG/5ML PO SUER
5.0000 mL | Freq: Once | ORAL | Status: AC
  Administered 2018-11-22: 5 mL via ORAL
  Filled 2018-11-22: qty 5

## 2018-11-22 MED ORDER — FUROSEMIDE 40 MG PO TABS: 40.0000 mg | ORAL_TABLET | Freq: Every day | ORAL | 0 refills | Status: DC

## 2018-11-22 MED ORDER — MEDICAL COMPRESSION STOCKINGS MISC: 0 refills | Status: DC

## 2018-11-22 MED ORDER — AZITHROMYCIN 250 MG PO TABS: 250.0000 mg | ORAL_TABLET | Freq: Every day | ORAL | 0 refills | Status: DC

## 2018-11-22 NOTE — ED Notes (Signed)
Medication management clinic information given to pt for RX given as well as good RX card.

## 2018-11-22 NOTE — ED Provider Notes (Signed)
[facility] Emergency Department Provider Note  Time seen: 5:37 PM  I have reviewed the triage vital signs and the nursing notes.   HISTORY  Chief Complaint Shortness of Breath; Cough; and Leg Swelling    HPI [name] is a 32 y.o. female with a past medical history of diabetes, presents to the emergency department with lower extremity swelling, cough and congestion.  According to the patient for the past 2 to 3 days she has been coughing, congested, complaining of significant lower extremity swelling and discomfort.  Patient states she has had swelling in her legs previously, as an ongoing issue is one point was diagnosed with congestive heart failure but does not believe she ever had an ultrasound performed does not believe she ever saw a cardiologist.  Patient states she has been on Lasix for the fluid before.  For the past 2 to 3 days she is also been coughing, feels hot with chills but denies any measured temperature.  States yesterday some chest discomfort with cough as well.   Past Medical History:  Diagnosis Date  . Gestational diabetes   . Headache   . Heroin abuse [facility])     Patient Active Problem List   Diagnosis Date Noted  . Delivery by elective cesarean section 06/15/2018  . Postoperative state 06/15/2018  . Labor and delivery indication for care or intervention 06/10/2018  . Abdominal pain affecting pregnancy 03/12/2018  . Symptomatic cholelithiasis   . Acute cholecystitis 09/22/2016    Past Surgical History:  Procedure Laterality Date  . ANKLE SURGERY Left 2012  . CESAREAN SECTION  2011  . CESAREAN SECTION WITH BILATERAL TUBAL LIGATION Bilateral 06/15/2018   Procedure: CESAREAN SECTION WITH BILATERAL TUBAL LIGATION;  Surgeon: Suzy Bouchard, MD;  Location: [facility] ORS;  Service: Obstetrics;  Laterality: Bilateral;  . CHOLECYSTECTOMY N/A 09/27/2016   Procedure: LAPAROSCOPIC CHOLECYSTECTOMY WITH INTRAOPERATIVE CHOLANGIOGRAM;   Surgeon: Lattie Haw, MD;  Location: [facility] ORS;  Service: General;  Laterality: N/A;  . DILATION AND CURETTAGE OF UTERUS  2013  . TONSILLECTOMY  2007    Prior to Admission medications   Medication Sig Start Date End Date Taking? Authorizing Provider  oxyCODONE-acetaminophen (PERCOCET/ROXICET) 5-325 MG tablet Take 1-2 tablets by mouth every 4 (four) hours as needed (pain scale 4-7). 06/17/18   Sharee Pimple, CNM  Prenatal Vit-Fe Fumarate-FA (MULTIVITAMIN-PRENATAL) 27-0.8 MG TABS tablet Take 1 tablet by mouth daily.     [provider]  sertraline (ZOLOFT) 50 MG tablet Take 1 tablet (50 mg total) by mouth daily. 06/17/18 06/17/19  Sharee Pimple, CNM    Allergies  Allergen Reactions  . Clindamycin/Lincomycin Anaphylaxis and Rash  . Amoxicillin Rash    Has patient had a PCN reaction causing immediate rash, facial/tongue/throat swelling, SOB or lightheadedness with hypotension: Yes Has patient had a PCN reaction causing severe rash involving mucus membranes or skin necrosis: No Has patient had a PCN reaction that required hospitalization: No Has patient had a PCN reaction occurring within the last 10 years: No If all of the above answers are "NO", then may proceed with Cephalosporin use.     Family History  Problem Relation Age of Onset  . Cancer Mother        skin    Social History Social History   Tobacco Use  . Smoking status: Current Every Day Smoker    Packs/day: 0.50    Years: 13.00    Pack years: 6.50    Types: Cigarettes  .  Smokeless tobacco: Never Used  Substance Use Topics  . Alcohol use: No  . Drug use: Yes    Types: Heroin, Cocaine    Review of Systems Constitutional: Negative for fever. ENT: Positive for congestion Cardiovascular: Mild chest discomfort yesterday Respiratory: States frequent cough with occasional yellow sputum production Gastrointestinal: Negative for abdominal pain, vomiting  Musculoskeletal: Lower extremity edema  bilaterally Skin: Negative for skin complaints  Neurological: Negative for headache All other ROS negative  ____________________________________________   PHYSICAL EXAM:  VITAL SIGNS: ED Triage Vitals  Enc Vitals Group     BP 11/22/18 1530 120/73     Pulse Rate 11/22/18 1530 (!) 104     Resp 11/22/18 1530 20     Temp 11/22/18 1530 98.5 F (36.9 C)     Temp Source 11/22/18 1530 Oral     SpO2 11/22/18 1530 100 %     Weight 11/22/18 1531 236 lb (107 kg)     Height 11/22/18 1531 5\' 4"  (1.626 m)     Head Circumference --      Peak Flow --      Pain Score 11/22/18 1531 9     Pain Loc --      Pain Edu? --      Excl. in GC? --    Constitutional: Alert and oriented. Well appearing and in no distress. Eyes: Normal exam ENT   Head: Normocephalic and atraumatic.   Mouth/Throat: Mucous membranes are moist. Cardiovascular: Normal rate, regular rhythm. No murmur Respiratory: Normal respiratory effort without tachypnea nor retractions. Breath sounds are clear  Gastrointestinal: Soft and nontender. No distention.   Musculoskeletal: 2-3+ lower extremity edema, equal bilaterally, moderate tenderness to palpation bilaterally.  Pitting. Neurologic:  Normal speech and language. No gross focal neurologic deficits Skin:  Skin is warm, dry and intact.  Psychiatric: Mood and affect are normal.   ____________________________________________    EKG  EKG viewed and interpreted by myself shows a normal sinus rhythm at 98 bpm with a narrow QRS, normal axis, normal intervals, nonspecific ST changes.  ____________________________________________    RADIOLOGY  Chest x-ray is negative  ____________________________________________   INITIAL IMPRESSION / ASSESSMENT AND PLAN / ED COURSE  Pertinent labs & imaging results that were available during my care of the patient were reviewed by me and considered in my medical decision making (see chart for details).  Patient presents to the  emergency department for 2 to 3 days of cough, congestion and bilateral lower extremity edema and discomfort.  Patient states a history of lower extremity edema and discomfort in the past.  Differential would include peripheral edema, CHF exacerbation, upper respiratory infection, cellulitis.  No signs of cellulitis on exam.  Symptoms are much more consistent with fluid overload/peripheral edema.  We will place the patient back on Lasix, compression stockings.  We will also cover given her congestion cough with yellow sputum production with Zithromax as well as a cough medication.  Patient agreeable to plan of care.  I urged patient to follow-up with a cardiologist, she states she will call and see what she can arrange, as she does not currently have money to pay for an appointment.  ____________________________________________   FINAL CLINICAL IMPRESSION(S) / ED DIAGNOSES  Peripheral edema    Minna AntisPaduchowski, Bralyn Folkert, MD 11/22/18 1740

## 2018-11-22 NOTE — ED Triage Notes (Signed)
Pt reports that she is having swelling in both of her legs a couple days ago, and developed a productive cough (yellow Phlem) and chest pain with SOB yesterday.

## 2018-11-23 LAB — POCT PREGNANCY, URINE: Preg Test, Ur: NEGATIVE

## 2019-01-28 ENCOUNTER — Emergency Department: Payer: Medicaid Other

## 2019-01-28 ENCOUNTER — Other Ambulatory Visit: Payer: Self-pay

## 2019-01-28 ENCOUNTER — Emergency Department
Admission: EM | Admit: 2019-01-28 | Discharge: 2019-01-28 | Disposition: A | Discharge status: Home / Self Care | Payer: Medicaid Other | Attending: Emergency Medicine | Admitting: Emergency Medicine

## 2019-01-28 ENCOUNTER — Encounter: Payer: Self-pay | Admitting: Emergency Medicine

## 2019-01-28 DIAGNOSIS — S0990XA Unspecified injury of head, initial encounter: Secondary | ICD-10-CM

## 2019-01-28 DIAGNOSIS — F17210 Nicotine dependence, cigarettes, uncomplicated: Secondary | ICD-10-CM | POA: Insufficient documentation

## 2019-01-28 DIAGNOSIS — Y9389 Activity, other specified: Secondary | ICD-10-CM | POA: Insufficient documentation

## 2019-01-28 DIAGNOSIS — Y998 Other external cause status: Secondary | ICD-10-CM | POA: Insufficient documentation

## 2019-01-28 DIAGNOSIS — S060X0A Concussion without loss of consciousness, initial encounter: Secondary | ICD-10-CM | POA: Insufficient documentation

## 2019-01-28 DIAGNOSIS — Y9289 Other specified places as the place of occurrence of the external cause: Secondary | ICD-10-CM | POA: Diagnosis not present

## 2019-01-28 DIAGNOSIS — S060X1A Concussion with loss of consciousness of 30 minutes or less, initial encounter: Secondary | ICD-10-CM

## 2019-01-28 MED ORDER — OXYCODONE-ACETAMINOPHEN 5-325 MG PO TABS
1.0000 | ORAL_TABLET | Freq: Once | ORAL | Status: AC
  Administered 2019-01-28: 1 via ORAL
  Filled 2019-01-28: qty 1

## 2019-01-28 MED ORDER — ONDANSETRON 4 MG PO TBDP
4.0000 mg | ORAL_TABLET | Freq: Once | ORAL | Status: AC
  Administered 2019-01-28: 4 mg via ORAL
  Filled 2019-01-28: qty 1

## 2019-01-28 MED ORDER — IBUPROFEN 600 MG PO TABS: 600.0000 mg | ORAL_TABLET | Freq: Four times a day (QID) | ORAL | 0 refills | Status: DC | PRN

## 2019-01-28 NOTE — ED Notes (Addendum)
Patient left with her brother-in-law. Patient states she feels safe where she is going. Patient appeared anxious to leave.
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
## 2019-01-28 NOTE — Discharge Instructions (Signed)
Your head CT, cervical CT, facial CT, chest xray do not show any acute abnormalities.  Your symptoms are consistent with a concussion.  Please follow-up with primary care next week for reevaluation.
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
## 2019-05-23 ENCOUNTER — Encounter: Payer: Self-pay | Admitting: Emergency Medicine

## 2019-05-23 ENCOUNTER — Emergency Department: Payer: Medicaid Other

## 2019-05-23 ENCOUNTER — Emergency Department
Admission: EM | Admit: 2019-05-23 | Discharge: 2019-05-23 | Disposition: A | Discharge status: Home / Self Care | Payer: Medicaid Other | Attending: Emergency Medicine | Admitting: Emergency Medicine

## 2019-05-23 ENCOUNTER — Other Ambulatory Visit: Payer: Self-pay

## 2019-05-23 DIAGNOSIS — Z79899 Other long term (current) drug therapy: Secondary | ICD-10-CM | POA: Insufficient documentation

## 2019-05-23 DIAGNOSIS — Z8632 Personal history of gestational diabetes: Secondary | ICD-10-CM | POA: Insufficient documentation

## 2019-05-23 DIAGNOSIS — M25572 Pain in left ankle and joints of left foot: Secondary | ICD-10-CM | POA: Diagnosis present

## 2019-05-23 DIAGNOSIS — F1410 Cocaine abuse, uncomplicated: Secondary | ICD-10-CM | POA: Diagnosis not present

## 2019-05-23 DIAGNOSIS — F17210 Nicotine dependence, cigarettes, uncomplicated: Secondary | ICD-10-CM | POA: Diagnosis not present

## 2019-05-23 DIAGNOSIS — G8929 Other chronic pain: Secondary | ICD-10-CM

## 2019-05-23 DIAGNOSIS — R2242 Localized swelling, mass and lump, left lower limb: Secondary | ICD-10-CM | POA: Diagnosis not present

## 2019-05-23 DIAGNOSIS — F1110 Opioid abuse, uncomplicated: Secondary | ICD-10-CM | POA: Insufficient documentation

## 2019-05-23 MED ORDER — DICLOFENAC SODIUM 75 MG PO TBEC: 75.0000 mg | DELAYED_RELEASE_TABLET | Freq: Two times a day (BID) | ORAL | 6 refills | Status: DC

## 2019-05-23 NOTE — ED Notes (Addendum)
See triage note. Pt presents c/o l ankle/lower leg pain. Hx surgery to l ankle 2012, pt states pain has been gradually increasing over the last few months. No new injury. ROM intact, pain is primarily when weight is placed on L foot.

## 2019-05-23 NOTE — Discharge Instructions (Addendum)
Follow up with dr Sabra Heck , call for an appointment, use the medication as prescribed

## 2019-05-23 NOTE — ED Triage Notes (Signed)
Pt reports had surgery to left ankle in 2010 and has had pain issues since. Pt states past few days pain has been severe.

## 2019-05-23 NOTE — ED Provider Notes (Signed)
[facility] Emergency Department Provider Note  ____________________________________________   First MD Initiated Contact with Patient 05/23/19 1405     (approximate)  I have reviewed the triage vital signs and the nursing notes.   HISTORY  Chief Complaint Joint Swelling    HPI [name] is a 33 y.o. female presents emergency department complaining of left ankle and lower leg pain.  History of ankle surgery in 2012 by Dr. Hyacinth MeekerMiller.  States pain has gradually been increasing over last few months.  Worse with bearing weight.  She denies any numbness or tingling.    Past Medical History:  Diagnosis Date  . Gestational diabetes   . Headache   . Heroin abuse [facility](HCC)     Patient Active Problem List   Diagnosis Date Noted  . Delivery by elective cesarean section 06/15/2018  . Postoperative state 06/15/2018  . Labor and delivery indication for care or intervention 06/10/2018  . Abdominal pain affecting pregnancy 03/12/2018  . Symptomatic cholelithiasis   . Acute cholecystitis 09/22/2016    Past Surgical History:  Procedure Laterality Date  . ANKLE SURGERY Left 2012  . CESAREAN SECTION  2011  . CESAREAN SECTION WITH BILATERAL TUBAL LIGATION Bilateral 06/15/2018   Procedure: CESAREAN SECTION WITH BILATERAL TUBAL LIGATION;  Surgeon: Suzy BouchardSchermerhorn, Thomas J, MD;  Location: [facility] ORS;  Service: Obstetrics;  Laterality: Bilateral;  . CHOLECYSTECTOMY N/A 09/27/2016   Procedure: LAPAROSCOPIC CHOLECYSTECTOMY WITH INTRAOPERATIVE CHOLANGIOGRAM;  Surgeon: Lattie Hawichard E Cooper, MD;  Location: [facility] ORS;  Service: General;  Laterality: N/A;  . DILATION AND CURETTAGE OF UTERUS  2013  . TONSILLECTOMY  2007    Prior to Admission medications   Medication Sig Start Date End Date Taking? Authorizing Provider  diclofenac (VOLTAREN) 75 MG EC tablet Take 1 tablet (75 mg total) by mouth 2 (two) times daily. 05/23/19   Sherrie MustacheFisher, Roselyn BeringSusan W, PA-C  Elastic Bandages & Supports  (MEDICAL COMPRESSION STOCKINGS) MISC Please provide 15mmHg compression stockings 11/22/18   Minna AntisPaduchowski, Kevin, MD  furosemide (LASIX) 40 MG tablet Take 1 tablet (40 mg total) by mouth daily for 10 days. 11/22/18 12/02/18  Minna AntisPaduchowski, Kevin, MD  Prenatal Vit-Fe Fumarate-FA (MULTIVITAMIN-PRENATAL) 27-0.8 MG TABS tablet Take 1 tablet by mouth daily.     [provider]  sertraline (ZOLOFT) 50 MG tablet Take 1 tablet (50 mg total) by mouth daily. 06/17/18 06/17/19  Sharee PimpleJones, Caron W, CNM    Allergies Clindamycin/lincomycin and Amoxicillin  Family History  Problem Relation Age of Onset  . Cancer Mother        skin    Social History Social History   Tobacco Use  . Smoking status: Current Every Day Smoker    Packs/day: 0.50    Years: 13.00    Pack years: 6.50    Types: Cigarettes  . Smokeless tobacco: Never Used  Substance Use Topics  . Alcohol use: No  . Drug use: Yes    Types: Heroin, Cocaine    Review of Systems  Constitutional: No fever/chills Eyes: No visual changes. ENT: No sore throat. Respiratory: Denies cough Genitourinary: Negative for dysuria. Musculoskeletal: Negative for back pain.  Left ankle pain Skin: Negative for rash.    ____________________________________________   PHYSICAL EXAM:  VITAL SIGNS: ED Triage Vitals  Enc Vitals Group     BP 05/23/19 1400 130/77     Pulse Rate 05/23/19 1400 86     Resp 05/23/19 1400 20     Temp 05/23/19 1400 98.6 F (37 C)  Temp Source 05/23/19 1400 Oral     SpO2 05/23/19 1400 97 %     Weight 05/23/19 1353 220 lb (99.8 kg)     Height 05/23/19 1353 5\' 4"  (1.626 m)     Head Circumference --      Peak Flow --      Pain Score 05/23/19 1352 7     Pain Loc --      Pain Edu? --      Excl. in [place]? --     Constitutional: Alert and oriented. Well appearing and in no acute distress. Eyes: Conjunctivae are normal.  Head: Atraumatic. Nose: No congestion/rhinnorhea. Mouth/Throat: Mucous membranes are moist.    Neck:  supple no lymphadenopathy noted Cardiovascular: Normal rate, regular rhythm.  Respiratory: Normal respiratory effort.  No retractions, GU: deferred Musculoskeletal: FROM all extremities, warm and well perfused, tendons appear to be intact, ligaments appear to be intact, no tenderness upon palpation, pain reproduced with weightbearing Neurologic:  Normal speech and language.  Skin:  Skin is warm, dry and intact. No rash noted. Psychiatric: Mood and affect are normal. Speech and behavior are normal.  ____________________________________________   LABS (all labs ordered are listed, but only abnormal results are displayed)  Labs Reviewed - No data to display ____________________________________________   ____________________________________________  RADIOLOGY  xray left ankle is normal  ____________________________________________   PROCEDURES  Procedure(s) performed: Stirrup splint applied by tech  Procedures    ____________________________________________   INITIAL IMPRESSION / ASSESSMENT AND PLAN / ED COURSE  Pertinent labs & imaging results that were available during my care of the patient were reviewed by me and considered in my medical decision making (see chart for details).   Patient is a 33 year old female presents emergency department with complaints of left ankle pain.  History of surgery in 2012.  Physical exam shows left ankle to be nontender, pain is reproduced with weightbearing.  X-ray of the left ankle is negative for any acute abnormality  Explained the results to the patient.  Encouraged her to follow-up with Dr. Sabra Heck who performed her surgery.  She is given prescription for Voltaren 75 mg twice daily.  Apply ice and elevate.  Placed in a stirrup splint and discharged in stable condition.     As part of my medical decision making, I reviewed the following data within the [place] notes reviewed and incorporated,  Old chart reviewed, Radiograph reviewed x-ray left ankle is normal, Notes from prior ED visits and [place] Controlled Substance Database  ____________________________________________   FINAL CLINICAL IMPRESSION(S) / ED DIAGNOSES  Final diagnoses:  Chronic pain of left ankle      NEW MEDICATIONS STARTED DURING THIS VISIT:  New Prescriptions   DICLOFENAC (VOLTAREN) 75 MG EC TABLET    Take 1 tablet (75 mg total) by mouth 2 (two) times daily.     Note:  This document was prepared using Dragon voice recognition software and may include unintentional dictation errors.    Versie Starks, PA-C 05/23/19 1539    Delman Kitten, MD 05/23/19 2149

## 2019-08-23 ENCOUNTER — Other Ambulatory Visit: Payer: Self-pay | Admitting: *Deleted

## 2019-08-23 DIAGNOSIS — Z20822 Contact with and (suspected) exposure to covid-19: Secondary | ICD-10-CM

## 2019-08-24 LAB — NOVEL CORONAVIRUS, NAA: SARS-CoV-2, NAA: NOT DETECTED

## 2021-06-09 ENCOUNTER — Ambulatory Visit
Admission: EM | Admit: 2021-06-09 | Discharge: 2021-06-09 | Disposition: A | Discharge status: Home / Self Care | Payer: Medicaid Other | Attending: Emergency Medicine | Admitting: Emergency Medicine

## 2021-06-09 ENCOUNTER — Other Ambulatory Visit: Payer: Self-pay

## 2021-06-09 DIAGNOSIS — J069 Acute upper respiratory infection, unspecified: Secondary | ICD-10-CM | POA: Insufficient documentation

## 2021-06-09 LAB — GROUP A STREP BY PCR: Group A Strep by PCR: NOT DETECTED

## 2021-06-09 MED ORDER — IPRATROPIUM BROMIDE 0.06 % NA SOLN: 2.0000 | Freq: Four times a day (QID) | NASAL | 12 refills | Status: DC

## 2021-06-09 NOTE — ED Provider Notes (Signed)
[facility]    CSN: [id number] Arrival date & time: 06/09/21  0827      History   Chief Complaint Chief Complaint  Patient presents with   Sore Throat    HPI [name] is a 35 y.o. female.   HPI  35 year old female here for evaluation of sore throat.  Patient ports that she woke up this morning with a sore throat and swollen lymph nodes in her neck.  She has had a subjective fever this morning as well as some nasal congestion and postnasal drip.  She denies runny nose, ear pain or pressure, or cough above baseline.  Patient is a smoker so she has a cough regularly.  Patient is nontoxic in appearance.  Past Medical History:  Diagnosis Date   Gestational diabetes    Headache    Heroin abuse [facility])     Patient Active Problem List   Diagnosis Date Noted   Delivery by elective cesarean section 06/15/2018   Postoperative state 06/15/2018   Labor and delivery indication for care or intervention 06/10/2018   Abdominal pain affecting pregnancy 03/12/2018   Symptomatic cholelithiasis    Acute cholecystitis 09/22/2016    Past Surgical History:  Procedure Laterality Date   ANKLE SURGERY Left 2012   CESAREAN SECTION  2011   CESAREAN SECTION WITH BILATERAL TUBAL LIGATION Bilateral 06/15/2018   Procedure: CESAREAN SECTION WITH BILATERAL TUBAL LIGATION;  Surgeon: Suzy Bouchard, MD;  Location: [facility] ORS;  Service: Obstetrics;  Laterality: Bilateral;   CHOLECYSTECTOMY N/A 09/27/2016   Procedure: LAPAROSCOPIC CHOLECYSTECTOMY WITH INTRAOPERATIVE CHOLANGIOGRAM;  Surgeon: Lattie Haw, MD;  Location: [facility] ORS;  Service: General;  Laterality: N/A;   DILATION AND CURETTAGE OF UTERUS  2013   TONSILLECTOMY  2007    OB History     Gravida  4   Para  2   Term  1   Preterm      AB  2   Living  1      SAB  1   IAB      Ectopic  1   Multiple  0   Live Births  1        Obstetric Comments  1st Menstrual Cycle:  12 1st Pregnancy:  21           Home Medications    Prior to Admission medications   Medication Sig Start Date End Date Taking? Authorizing Provider  ipratropium (ATROVENT) 0.06 % nasal spray Place 2 sprays into both nostrils 4 (four) times daily. 06/09/21  Yes Becky Augusta, NP  sertraline (ZOLOFT) 50 MG tablet Take 1 tablet (50 mg total) by mouth daily. 06/17/18 06/09/21 Yes Sharee Pimple, CNM  furosemide (LASIX) 40 MG tablet Take 1 tablet (40 mg total) by mouth daily for 10 days. 11/22/18 12/02/18  Minna Antis, MD    Family History Family History  Problem Relation Age of Onset   Cancer Mother        skin    Social History Social History   Tobacco Use   Smoking status: Every Day    Packs/day: 0.50    Years: 13.00    Pack years: 6.50    Types: Cigarettes   Smokeless tobacco: Never  Vaping Use   Vaping Use: Never used  Substance Use Topics   Alcohol use: No   Drug use: Yes    Types: Heroin, Cocaine     Allergies   Clindamycin/lincomycin and Amoxicillin   Review of Systems  Review of Systems  Constitutional:  Positive for fever.  HENT:  Positive for congestion, postnasal drip and sore throat. Negative for ear pain and rhinorrhea.   Respiratory:  Positive for cough. Negative for shortness of breath and wheezing.     Physical Exam Triage Vital Signs ED Triage Vitals [06/09/21 0838]  Enc Vitals Group     BP 137/87     Pulse Rate 81     Resp 18     Temp 98.2 F (36.8 C)     Temp Source Oral     SpO2 99 %     Weight 240 lb (108.9 kg)     Height 5\' 4"  (1.626 m)     Head Circumference      Peak Flow      Pain Score 5     Pain Loc      Pain Edu?      Excl. in GC?    No data found.  Updated Vital Signs BP 137/87 (BP Location: Right Arm)   Pulse 81   Temp 98.2 F (36.8 C) (Oral)   Resp 18   Ht 5\' 4"  (1.626 m)   Wt 240 lb (108.9 kg)   LMP 05/30/2021   SpO2 99%   BMI 41.20 kg/m   Visual Acuity Right Eye Distance:   Left Eye Distance:   Bilateral Distance:     Right Eye Near:   Left Eye Near:    Bilateral Near:     Physical Exam Vitals and nursing note reviewed.  Constitutional:      Appearance: She is well-developed. She is obese. She is not ill-appearing.  HENT:     Head: Normocephalic and atraumatic.     Right Ear: Tympanic membrane and ear canal normal. No middle ear effusion. Tympanic membrane is not erythematous.     Left Ear: Tympanic membrane and ear canal normal.  No middle ear effusion. Tympanic membrane is not erythematous.     Nose: Congestion and rhinorrhea present.     Mouth/Throat:     Mouth: Mucous membranes are moist.     Pharynx: Oropharynx is clear. Posterior oropharyngeal erythema present.  Cardiovascular:     Rate and Rhythm: Normal rate and regular rhythm.     Heart sounds: Normal heart sounds. No murmur heard.   No gallop.  Pulmonary:     Effort: Pulmonary effort is normal.     Breath sounds: Normal breath sounds. No wheezing, rhonchi or rales.  Musculoskeletal:     Cervical back: Normal range of motion and neck supple.  Lymphadenopathy:     Cervical: No cervical adenopathy.  Skin:    General: Skin is warm and dry.     Capillary Refill: Capillary refill takes less than 2 seconds.     Findings: No erythema or rash.  Neurological:     General: No focal deficit present.     Mental Status: She is alert and oriented to person, place, and time.  Psychiatric:        Mood and Affect: Mood normal.        Behavior: Behavior normal.     UC Treatments / Results  Labs (all labs ordered are listed, but only abnormal results are displayed) Labs Reviewed  GROUP A STREP BY PCR    EKG   Radiology No results found.  Procedures Procedures (including critical care time)  Medications Ordered in UC Medications - No data to display  Initial Impression / Assessment and Plan / UC Course  I have reviewed the triage vital signs and the nursing notes.  Pertinent labs & imaging results that were available during  my care of the patient were reviewed by me and considered in my medical decision making (see chart for details).  Patient is a nontoxic-appearing 35 year old female here for evaluation of sore throat as outlined in HPI above.  Patient's physical exam reveals pearly gray tympanic membranes bilaterally with normal light reflex and clear external auditory canals.  Nasal mucosa is erythematous and edematous with scant clear nasal discharge.  Oropharyngeal exam reveals surgically absent tonsillar pillars.  The posterior oropharynx is erythematous and demonstrates cobblestoning with clear postnasal drip.  No cervical lymphadenopathy appreciated on exam.  Cardiopulmonary exam is benign.  Strep PCR collected at triage.  Strep PCR is negative.  Suspect patient's sore throat is coming from her postnasal drip.  Will discharge patient home with a diagnosis of a URI and treat with Atrovent nasal spray and over-the-counter antihistamines.    Final Clinical Impressions(s) / UC Diagnoses   Final diagnoses:  Upper respiratory tract infection, unspecified type     Discharge Instructions      Use the Atrovent nasal spray, 2 squirts in each nostril every 6 hours, as needed for runny nose and postnasal drip.  Use over-the-counter Allegra 180 mg daily or Zyrtec or Claritin 10 mg daily to help with nasal congestion and postnasal drip.   Return for reevaluation or see your primary care provider for any new or worsening symptoms.      ED Prescriptions     Medication Sig Dispense Auth. Provider   ipratropium (ATROVENT) 0.06 % nasal spray Place 2 sprays into both nostrils 4 (four) times daily. 15 mL Becky Augusta, NP      PDMP not reviewed this encounter.   Becky Augusta, NP 06/09/21 [phone]

## 2021-06-09 NOTE — Discharge Instructions (Addendum)
Use the Atrovent nasal spray, 2 squirts in each nostril every 6 hours, as needed for runny nose and postnasal drip.  Use over-the-counter Allegra 180 mg daily or Zyrtec or Claritin 10 mg daily to help with nasal congestion and postnasal drip.   Return for reevaluation or see your primary care provider for any new or worsening symptoms.

## 2021-06-09 NOTE — ED Triage Notes (Signed)
Patient complains of sore throat that started this morning when waking up. States that she has noticed lymph node swelling in her throat.

## 2021-06-26 ENCOUNTER — Encounter: Payer: Self-pay | Admitting: Nurse Practitioner

## 2021-06-26 ENCOUNTER — Other Ambulatory Visit: Payer: Self-pay

## 2021-06-26 ENCOUNTER — Ambulatory Visit (INDEPENDENT_AMBULATORY_CARE_PROVIDER_SITE_OTHER): Payer: Medicaid Other | Admitting: Nurse Practitioner

## 2021-06-26 VITALS — BP 133/92 | HR 79 | Temp 98.6°F | Ht 64.21 in | Wt 238.0 lb

## 2021-06-26 DIAGNOSIS — Z87891 Personal history of nicotine dependence: Secondary | ICD-10-CM | POA: Insufficient documentation

## 2021-06-26 DIAGNOSIS — Z23 Encounter for immunization: Secondary | ICD-10-CM

## 2021-06-26 DIAGNOSIS — F17200 Nicotine dependence, unspecified, uncomplicated: Secondary | ICD-10-CM

## 2021-06-26 DIAGNOSIS — D649 Anemia, unspecified: Secondary | ICD-10-CM

## 2021-06-26 DIAGNOSIS — Z7689 Persons encountering health services in other specified circumstances: Secondary | ICD-10-CM | POA: Diagnosis not present

## 2021-06-26 DIAGNOSIS — O24419 Gestational diabetes mellitus in pregnancy, unspecified control: Secondary | ICD-10-CM | POA: Diagnosis not present

## 2021-06-26 DIAGNOSIS — F603 Borderline personality disorder: Secondary | ICD-10-CM

## 2021-06-26 HISTORY — DX: Nicotine dependence, unspecified, uncomplicated: F17.200

## 2021-06-26 NOTE — Progress Notes (Signed)
BP (!) 133/92   Pulse 79   Temp 98.6 F (37 C)   Ht 5' 4.21" (1.631 m)   Wt 238 lb (108 kg)   LMP 05/30/2021   SpO2 96%   BMI 40.58 kg/m    Subjective:    Patient ID: [name], female    DOB: 20-Nov-1986, 35 y.o.   MRN: [id number]  HPI: [name] is a 35 y.o. female  Chief Complaint  Patient presents with   Establish Care   Gestational Diabetes   Patient presents to clinic to establish care with new PCP.  Patient reports a history of Depression, Anxiety, Borderline Personality Disorder, gestational diabetes, migraines, insomnia.  Patient see's psychiatry at [facility].   She wants to start medication for ADHD. [facility] told her to come here and get the medication.    Denies HA, CP, SOB, dizziness, palpitations, visual changes, and lower extremity swelling.  Patient denies a history of: Hypertension, Elevated Cholesterol, Thyroid problems, Neurological problems, and Abdominal problems.   Flowsheet Row Office Visit from 06/26/2021 in [facility]  PHQ-9 Total Score 15                  Active Ambulatory Problems    Diagnosis Date Noted   Acute cholecystitis 09/22/2016   Symptomatic cholelithiasis    Abdominal pain affecting pregnancy 03/12/2018   Labor and delivery indication for care or intervention 06/10/2018   Delivery by elective cesarean section 06/15/2018   Postoperative state 06/15/2018   Borderline personality disorder (HCC) 04/16/2013   Cannabis abuse 04/16/2013   History of drug use 12/14/2017   History of tobacco use 06/26/2021   Anemia, mild 03/28/2018   Morbid obesity (HCC) 06/26/2021   Resolved Ambulatory Problems    Diagnosis Date Noted   No Resolved Ambulatory Problems   Past Medical History:  Diagnosis Date   Gestational diabetes    Headache    Heroin abuse (HCC)    Migraines    Past Surgical History:  Procedure Laterality Date   ANKLE SURGERY Left 2012   CESAREAN SECTION  2011   CESAREAN SECTION WITH BILATERAL TUBAL  LIGATION Bilateral 06/15/2018   Procedure: CESAREAN SECTION WITH BILATERAL TUBAL LIGATION;  Surgeon: Suzy Bouchard, MD;  Location: [facility] ORS;  Service: Obstetrics;  Laterality: Bilateral;   CHOLECYSTECTOMY N/A 09/27/2016   Procedure: LAPAROSCOPIC CHOLECYSTECTOMY WITH INTRAOPERATIVE CHOLANGIOGRAM;  Surgeon: Lattie Haw, MD;  Location: [facility] ORS;  Service: General;  Laterality: N/A;   DILATION AND CURETTAGE OF UTERUS  2013   TONSILLECTOMY  2007   Family History  Problem Relation Age of Onset   Arthritis Mother    Cancer Mother        skin   Cancer Father        Lung   Anxiety disorder Sister    Depression Sister    ADD / ADHD Son      Relevant past medical, surgical, family and social history reviewed and updated as indicated. Interim medical history since our last visit reviewed. Allergies and medications reviewed and updated.  Review of Systems  Eyes:  Negative for visual disturbance.  Respiratory:  Negative for cough, chest tightness and shortness of breath.   Cardiovascular:  Negative for chest pain, palpitations and leg swelling.  Neurological:  Negative for dizziness and headaches.  Psychiatric/Behavioral:  Positive for dysphoric mood and sleep disturbance.    Per HPI unless specifically indicated above     Objective:    BP (!) 133/92  Pulse 79   Temp 98.6 F (37 C)   Ht 5' 4.21" (1.631 m)   Wt 238 lb (108 kg)   LMP 05/30/2021   SpO2 96%   BMI 40.58 kg/m   Wt Readings from Last 3 Encounters:  06/26/21 238 lb (108 kg)  06/09/21 240 lb (108.9 kg)  05/23/19 220 lb (99.8 kg)    Physical Exam Vitals and nursing note reviewed.  Constitutional:      General: She is not in acute distress.    Appearance: Normal appearance. She is normal weight. She is not ill-appearing, toxic-appearing or diaphoretic.  HENT:     Head: Normocephalic.     Right Ear: External ear normal.     Left Ear: External ear normal.     Nose: Nose normal.     Mouth/Throat:      Mouth: Mucous membranes are moist.     Pharynx: Oropharynx is clear.  Eyes:     General:        Right eye: No discharge.        Left eye: No discharge.     Extraocular Movements: Extraocular movements intact.     Conjunctiva/sclera: Conjunctivae normal.     Pupils: Pupils are equal, round, and reactive to light.  Cardiovascular:     Rate and Rhythm: Normal rate and regular rhythm.     Heart sounds: No murmur heard. Pulmonary:     Effort: Pulmonary effort is normal. No respiratory distress.     Breath sounds: Normal breath sounds. No wheezing or rales.  Musculoskeletal:     Cervical back: Normal range of motion and neck supple.  Skin:    General: Skin is warm and dry.     Capillary Refill: Capillary refill takes less than 2 seconds.  Neurological:     General: No focal deficit present.     Mental Status: She is alert and oriented to person, place, and time. Mental status is at baseline.  Psychiatric:        Mood and Affect: Mood normal.        Behavior: Behavior normal.        Thought Content: Thought content normal.        Judgment: Judgment normal.    Results for orders placed or performed during the hospital encounter of 06/09/21  Group A Strep by PCR   Specimen: Throat; Sterile Swab  Result Value Ref Range   Group A Strep by PCR NOT DETECTED NOT DETECTED      Assessment & Plan:   Problem List Items Addressed This Visit       Other   Borderline personality disorder ([place])    Chronic. Patient see's [facility] for psychiatry evaluation. Recently had Zoloft increased. She feels like she is doing well despite her PHQ9. Follow up with psychiatry.       Anemia, mild    CBC ordered to evaluate patient's anemia.  Will make recommendations based on lab results.        Morbid obesity (HCC)    Chronic.  Patient would like to discuss weight loss.  Reviewed saxenda during visit today and recommend establishing diet and exercise habits to be successful during and after medication.   If patient's insurance does not approve saxenda will send patient to weight loss clinic.  Patient understands and agrees with the plan of care.        Other Visit Diagnoses     Encounter to establish care    -  Primary  Gestational diabetes mellitus (GDM), antepartum, gestational diabetes method of control unspecified       Labs ordered today to evaluate status of diabetes. Will make recommendations based on lab results.    Relevant Orders   Comp Met (CMET)   HgB A1c   Immunization due       Relevant Orders   Pneumococcal polysaccharide vaccine 23-valent greater than or equal to 2yo subcutaneous/IM (Completed)        Follow up plan: Return in about 1 month (around 07/27/2021) for Physical and Fasting labs.

## 2021-06-26 NOTE — Assessment & Plan Note (Signed)
CBC ordered to evaluate patient's anemia.  Will make recommendations based on lab results.

## 2021-06-26 NOTE — Assessment & Plan Note (Signed)
Chronic. Patient see's [facility] for psychiatry evaluation. Recently had Zoloft increased. She feels like she is doing well despite her PHQ9. Follow up with psychiatry.

## 2021-06-26 NOTE — Assessment & Plan Note (Signed)
Chronic.  Patient would like to discuss weight loss.  Reviewed saxenda during visit today and recommend establishing diet and exercise habits to be successful during and after medication.  If patient's insurance does not approve saxenda will send patient to weight loss clinic.  Patient understands and agrees with the plan of care.

## 2021-06-27 LAB — COMPREHENSIVE METABOLIC PANEL
ALT: 14 IU/L (ref 0–32)
AST: 12 IU/L (ref 0–40)
Albumin/Globulin Ratio: 2 (ref 1.2–2.2)
Albumin: 4.2 g/dL (ref 3.8–4.8)
Alkaline Phosphatase: 89 IU/L (ref 44–121)
BUN/Creatinine Ratio: 11 (ref 9–23)
BUN: 9 mg/dL (ref 6–20)
Bilirubin Total: 0.2 mg/dL (ref 0.0–1.2)
CO2: 23 mmol/L (ref 20–29)
Calcium: 8.7 mg/dL (ref 8.7–10.2)
Chloride: 105 mmol/L (ref 96–106)
Creatinine, Ser: 0.81 mg/dL (ref 0.57–1.00)
Globulin, Total: 2.1 g/dL (ref 1.5–4.5)
Glucose: 98 mg/dL (ref 65–99)
Potassium: 4.5 mmol/L (ref 3.5–5.2)
Sodium: 140 mmol/L (ref 134–144)
Total Protein: 6.3 g/dL (ref 6.0–8.5)
eGFR: 97 mL/min/{1.73_m2} (ref >59)

## 2021-06-27 LAB — CBC WITH DIFFERENTIAL/PLATELET
Basophils Absolute: 0.1 10*3/uL (ref 0.0–0.2)
Basos: 1 %
EOS (ABSOLUTE): 0.2 10*3/uL (ref 0.0–0.4)
Eos: 2 %
Hematocrit: 37.8 % (ref 34.0–46.6)
Hemoglobin: 11.3 g/dL (ref 11.1–15.9)
Immature Grans (Abs): 0 10*3/uL (ref 0.0–0.1)
Immature Granulocytes: 0 %
Lymphocytes Absolute: 2.5 10*3/uL (ref 0.7–3.1)
Lymphs: 28 %
MCH: 21.4 pg — ABNORMAL LOW (ref 26.6–33.0)
MCHC: 29.9 g/dL — ABNORMAL LOW (ref 31.5–35.7)
MCV: 72 fL — ABNORMAL LOW (ref 79–97)
Monocytes Absolute: 0.5 10*3/uL (ref 0.1–0.9)
Monocytes: 6 %
Neutrophils Absolute: 5.9 10*3/uL (ref 1.4–7.0)
Neutrophils: 63 %
Platelets: 343 10*3/uL (ref 150–450)
RBC: 5.29 x10E6/uL — ABNORMAL HIGH (ref 3.77–5.28)
RDW: 16.9 % — ABNORMAL HIGH (ref 11.7–15.4)
WBC: 9.3 10*3/uL (ref 3.4–10.8)

## 2021-06-27 LAB — HEMOGLOBIN A1C
Est. average glucose Bld gHb Est-mCnc: 123 mg/dL
Hgb A1c MFr Bld: 5.9 % — ABNORMAL HIGH (ref 4.8–5.6)

## 2021-06-27 NOTE — Progress Notes (Signed)
Hi [name]. Your lab work shows that you are prediabetic. We will continue to monitor this in the future. Make sure to follow a low carb diet and exercise regularly. Also, your blood work shows that you are anemic. At your follow up we will do further testing to figure out the type of anemia.  See you soon.

## 2021-08-05 ENCOUNTER — Encounter: Payer: Self-pay | Admitting: Nurse Practitioner

## 2021-08-05 ENCOUNTER — Other Ambulatory Visit (HOSPITAL_COMMUNITY)
Admission: RE | Admit: 2021-08-05 | Discharge: 2021-08-05 | Disposition: A | Discharge status: Home / Self Care | Payer: Medicaid Other | Source: Ambulatory Visit | Attending: Nurse Practitioner | Admitting: Nurse Practitioner

## 2021-08-05 ENCOUNTER — Other Ambulatory Visit: Payer: Self-pay

## 2021-08-05 ENCOUNTER — Ambulatory Visit (INDEPENDENT_AMBULATORY_CARE_PROVIDER_SITE_OTHER): Payer: Medicaid Other | Admitting: Nurse Practitioner

## 2021-08-05 VITALS — BP 132/84 | HR 70 | Temp 97.8°F | Ht 64.5 in | Wt 243.4 lb

## 2021-08-05 DIAGNOSIS — Z124 Encounter for screening for malignant neoplasm of cervix: Secondary | ICD-10-CM | POA: Diagnosis not present

## 2021-08-05 DIAGNOSIS — Z7689 Persons encountering health services in other specified circumstances: Secondary | ICD-10-CM | POA: Diagnosis not present

## 2021-08-05 DIAGNOSIS — Z0000 Encounter for general adult medical examination without abnormal findings: Secondary | ICD-10-CM | POA: Diagnosis present

## 2021-08-05 DIAGNOSIS — R102 Pelvic and perineal pain: Secondary | ICD-10-CM | POA: Diagnosis not present

## 2021-08-05 LAB — URINALYSIS, ROUTINE W REFLEX MICROSCOPIC
Bilirubin, UA: NEGATIVE
Glucose, UA: NEGATIVE
Ketones, UA: NEGATIVE
Leukocytes,UA: NEGATIVE
Nitrite, UA: NEGATIVE
Protein,UA: NEGATIVE
RBC, UA: NEGATIVE
Specific Gravity, UA: 1.015 (ref 1.005–1.030)
Urobilinogen, Ur: 0.2 mg/dL (ref 0.2–1.0)
pH, UA: 7.5 (ref 5.0–7.5)

## 2021-08-05 MED ORDER — SAXENDA 18 MG/3ML ~~LOC~~ SOPN: 0.6000 mg | PEN_INJECTOR | Freq: Every day | SUBCUTANEOUS | 0 refills | Status: DC

## 2021-08-05 NOTE — Progress Notes (Signed)
BP 132/84   Pulse 70   Temp 97.8 F (36.6 C)   Ht 5' 4.5" (1.638 m)   Wt 243 lb 6 oz (110.4 kg)   LMP 07/22/2021 (Approximate)   SpO2 97%   BMI 41.13 kg/m    Subjective:    Patient ID: [name], female    DOB: [birth year], 35 y.o.   MRN: [id number]  HPI: [name] is a 35 y.o. female presenting on 08/05/2021 for comprehensive medical examination. Current medical complaints include:none  She currently lives with: Menopausal Symptoms: no  PELVIC PAIN Patient states she has lower abdominal pain all of the time.  Does not come and go. Not worse with menstrual cycles.  However, she does have a lot of cramping and clots with her period.  WEIGHT MANAGEMENT Patient would like to start [place] for weight loss.  Has been dieting and exercising without significant improvement in weight.    Denies HA, CP, SOB, dizziness, palpitations, visual changes, and lower extremity swelling.  Depression Screen done today and results listed below:  Depression screen [facility] 2/9 08/05/2021 06/26/2021 02/17/2018  Decreased Interest 0 0 0  Down, Depressed, Hopeless 0 3 0  PHQ - 2 Score 0 3 0  Altered sleeping 3 3 -  Tired, decreased energy 0 3 -  Change in appetite 0 3 -  Feeling bad or failure about yourself  0 0 -  Trouble concentrating 3 3 -  Moving slowly or fidgety/restless 0 0 -  Suicidal thoughts 0 0 -  PHQ-9 Score 6 15 -  Difficult doing work/chores Not difficult at all Not difficult at all -    The patient does not have a history of falls. I did complete a risk assessment for falls. A plan of care for falls was documented.   Past Medical History:  Past Medical History:  Diagnosis Date   Gestational diabetes    Headache    Heroin abuse ([place])    Migraines     Surgical History:  Past Surgical History:  Procedure Laterality Date   ANKLE SURGERY Left 2012   CESAREAN SECTION  2011   CESAREAN SECTION WITH BILATERAL TUBAL LIGATION Bilateral 06/15/2018   Procedure:  CESAREAN SECTION WITH BILATERAL TUBAL LIGATION;  Surgeon: Boykin Nearing, MD;  Location: [facility] ORS;  Service: Obstetrics;  Laterality: Bilateral;   CHOLECYSTECTOMY N/A 09/27/2016   Procedure: LAPAROSCOPIC CHOLECYSTECTOMY WITH INTRAOPERATIVE CHOLANGIOGRAM;  Surgeon: Florene Glen, MD;  Location: [facility] ORS;  Service: General;  Laterality: N/A;   [place] OF UTERUS  2013   TONSILLECTOMY  2007    Medications:  Current Outpatient Medications on File Prior to Visit  Medication Sig   sertraline (ZOLOFT) 100 MG tablet TK 2 TS PO HS   No current facility-administered medications on file prior to visit.    Allergies:  Allergies  Allergen Reactions   Clindamycin/Lincomycin Anaphylaxis and Rash   Amoxicillin Rash    Has patient had a PCN reaction causing immediate rash, facial/tongue/throat swelling, SOB or lightheadedness with hypotension: Yes Has patient had a PCN reaction causing severe rash involving mucus membranes or skin necrosis: No Has patient had a PCN reaction that required hospitalization: No Has patient had a PCN reaction occurring within the last 10 years: No If all of the above answers are "NO", then may proceed with Cephalosporin use.     Social History:  Social History   Socioeconomic History   Marital status: Divorced    Spouse name: Not  on file   Number of children: Not on file   Years of education: Not on file   Highest education level: Not on file  Occupational History   Not on file  Tobacco Use   Smoking status: Every Day    Packs/day: 1.00    Years: 13.00    Pack years: 13.00    Types: Cigarettes   Smokeless tobacco: Never  Vaping Use   Vaping Use: Never used  Substance and Sexual Activity   Alcohol use: No   Drug use: Not Currently   Sexual activity: Yes    Birth control/protection: Surgical  Other Topics Concern   Not on file  Social History Narrative   Not on file   Social Determinants of Health   Financial Resource  Strain: Not on file  Food Insecurity: Not on file  Transportation Needs: Not on file  Physical Activity: Not on file  Stress: Not on file  Social Connections: Not on file  Intimate Partner Violence: Not on file   Social History   Tobacco Use  Smoking Status Every Day   Packs/day: 1.00   Years: 13.00   Pack years: 13.00   Types: Cigarettes  Smokeless Tobacco Never   Social History   Substance and Sexual Activity  Alcohol Use No    Family History:  Family History  Problem Relation Age of Onset   Arthritis Mother    Cancer Mother        skin   Cancer Father        Lung   Anxiety disorder Sister    Depression Sister    ADD / ADHD Son     Past medical history, surgical history, medications, allergies, family history and social history reviewed with patient today and changes made to appropriate areas of the chart.   Review of Systems  Constitutional:  Negative for weight loss.  Eyes:  Negative for blurred vision and double vision.  Respiratory:  Negative for shortness of breath.   Cardiovascular:  Negative for chest pain, palpitations and leg swelling.  Genitourinary:        Pelvic pain  Neurological:  Negative for dizziness and headaches.  All other ROS negative except what is listed above and in the HPI.      Objective:    BP 132/84   Pulse 70   Temp 97.8 F (36.6 C)   Ht 5' 4.5" (1.638 m)   Wt 243 lb 6 oz (110.4 kg)   LMP 07/22/2021 (Approximate)   SpO2 97%   BMI 41.13 kg/m   Wt Readings from Last 3 Encounters:  08/05/21 243 lb 6 oz (110.4 kg)  06/26/21 238 lb (108 kg)  06/09/21 240 lb (108.9 kg)    Physical Exam Vitals and nursing note reviewed. Exam conducted with a chaperone present.  Constitutional:      General: She is awake. She is not in acute distress.    Appearance: She is well-developed. She is obese. She is not ill-appearing.  HENT:     Head: Normocephalic and atraumatic.     Right Ear: Hearing, tympanic membrane, ear canal and  external ear normal. No drainage.     Left Ear: Hearing, tympanic membrane, ear canal and external ear normal. No drainage.     Nose: Nose normal.     Right Sinus: No maxillary sinus tenderness or frontal sinus tenderness.     Left Sinus: No maxillary sinus tenderness or frontal sinus tenderness.     Mouth/Throat:       Mouth: Mucous membranes are moist.     Pharynx: Oropharynx is clear. Uvula midline. No pharyngeal swelling, oropharyngeal exudate or posterior oropharyngeal erythema.  Eyes:     General: Lids are normal.        Right eye: No discharge.        Left eye: No discharge.     Extraocular Movements: Extraocular movements intact.     Conjunctiva/sclera: Conjunctivae normal.     Pupils: Pupils are equal, round, and reactive to light.     Visual Fields: Right eye visual fields normal and left eye visual fields normal.  Neck:     Thyroid: No thyromegaly.     Vascular: No carotid bruit.     Trachea: Trachea normal.  Cardiovascular:     Rate and Rhythm: Normal rate and regular rhythm.     Heart sounds: Normal heart sounds. No murmur heard.   No gallop.  Pulmonary:     Effort: Pulmonary effort is normal. No accessory muscle usage or respiratory distress.     Breath sounds: Normal breath sounds.  Chest:  Breasts:    Right: Normal.     Left: Normal.  Abdominal:     General: Bowel sounds are normal.     Palpations: Abdomen is soft. There is no hepatomegaly or splenomegaly.     Tenderness: There is no abdominal tenderness.  Genitourinary:    Vagina: Normal.     Cervix: Normal.     Adnexa: Right adnexa normal and left adnexa normal.  Musculoskeletal:        General: Normal range of motion.     Cervical back: Normal range of motion and neck supple.     Right lower leg: No edema.     Left lower leg: No edema.  Lymphadenopathy:     Head:     Right side of head: No submental, submandibular, tonsillar, preauricular or posterior auricular adenopathy.     Left side of head: No  submental, submandibular, tonsillar, preauricular or posterior auricular adenopathy.     Cervical: No cervical adenopathy.     Upper Body:     Right upper body: No supraclavicular, axillary or pectoral adenopathy.     Left upper body: No supraclavicular, axillary or pectoral adenopathy.  Skin:    General: Skin is warm and dry.     Capillary Refill: Capillary refill takes less than 2 seconds.     Findings: No rash.  Neurological:     Mental Status: She is alert and oriented to person, place, and time.     Cranial Nerves: Cranial nerves are intact.     Gait: Gait is intact.     Deep Tendon Reflexes: Reflexes are normal and symmetric.     Reflex Scores:      Brachioradialis reflexes are 2+ on the right side and 2+ on the left side.      Patellar reflexes are 2+ on the right side and 2+ on the left side. Psychiatric:        Attention and Perception: Attention normal.        Mood and Affect: Mood normal.        Speech: Speech normal.        Behavior: Behavior normal. Behavior is cooperative.        Thought Content: Thought content normal.        Judgment: Judgment normal.    Results for orders placed or performed in visit on 06/26/21  Comp Met (CMET)  Result Value Ref Range  Glucose 98 65 - 99 mg/dL   BUN 9 6 - 20 mg/dL   Creatinine, Ser 0.81 0.57 - 1.00 mg/dL   eGFR 97 >59 mL/min/1.73   BUN/Creatinine Ratio 11 9 - 23   Sodium 140 134 - 144 mmol/L   Potassium 4.5 3.5 - 5.2 mmol/L   Chloride 105 96 - 106 mmol/L   CO2 23 20 - 29 mmol/L   Calcium 8.7 8.7 - 10.2 mg/dL   Total Protein 6.3 6.0 - 8.5 g/dL   Albumin 4.2 3.8 - 4.8 g/dL   Globulin, Total 2.1 1.5 - 4.5 g/dL   Albumin/Globulin Ratio 2.0 1.2 - 2.2   Bilirubin Total 0.2 0.0 - 1.2 mg/dL   Alkaline Phosphatase 89 44 - 121 IU/L   AST 12 0 - 40 IU/L   ALT 14 0 - 32 IU/L  HgB A1c  Result Value Ref Range   Hgb A1c MFr Bld 5.9 (H) 4.8 - 5.6 %   Est. average glucose Bld gHb Est-mCnc 123 mg/dL  CBC with Differential/Platelet   Result Value Ref Range   WBC 9.3 3.4 - 10.8 x10E3/uL   RBC 5.29 (H) 3.77 - 5.28 x10E6/uL   Hemoglobin 11.3 11.1 - 15.9 g/dL   Hematocrit 37.8 34.0 - 46.6 %   MCV 72 (L) 79 - 97 fL   MCH 21.4 (L) 26.6 - 33.0 pg   MCHC 29.9 (L) 31.5 - 35.7 g/dL   RDW 16.9 (H) 11.7 - 15.4 %   Platelets 343 150 - 450 x10E3/uL   Neutrophils 63 Not Estab. %   Lymphs 28 Not Estab. %   Monocytes 6 Not Estab. %   Eos 2 Not Estab. %   Basos 1 Not Estab. %   Neutrophils Absolute 5.9 1.4 - 7.0 x10E3/uL   Lymphocytes Absolute 2.5 0.7 - 3.1 x10E3/uL   Monocytes Absolute 0.5 0.1 - 0.9 x10E3/uL   EOS (ABSOLUTE) 0.2 0.0 - 0.4 x10E3/uL   Basophils Absolute 0.1 0.0 - 0.2 x10E3/uL   Immature Granulocytes 0 Not Estab. %   Immature Grans (Abs) 0.0 0.0 - 0.1 x10E3/uL      Assessment & Plan:   Problem List Items Addressed This Visit       Other   Morbid obesity ([place])    Will begin Saxenda daily for weight loss. Side effects and benefits of medication discussed with patient during visit.  Discussed importance of diet and exercise in conjunction with medication to aid in weight loss.  Follow up in 1 month for reevaluation.      Relevant Medications   Liraglutide -Weight Management (SAXENDA) 18 MG/3ML SOPN   Other Visit Diagnoses     Annual physical exam    -  Primary   Health maintenance reviewed during visit. Will think about HPV vaccine. Labs ordered today. PAP completed.   Relevant Orders   CBC with Differential/Platelet   Comprehensive metabolic panel   Lipid panel   TSH   Urinalysis, Routine w reflex microscopic   Cytology - PAP   Pelvic pain       Transvaginal US ordered to evaluate pelvic pain. Will make recommendations based on imaging results.    Relevant Orders   US Transvaginal Non-OB   Screening for cervical cancer       PAP obtained in normal fashion. Patient tolerated obtaining of specimen without complication. Escorted by Gerrit Halls, [place].   Encounter for weight management             Follow up  plan: Return in about 1 month (around 09/05/2021) for Weight Managment.   LABORATORY TESTING:  - Pap smear: pap done  IMMUNIZATIONS:   - Tdap: Tetanus vaccination status reviewed: last tetanus booster within 10 years. - Influenza: Postponed to flu season - Pneumovax: Up to date - Prevnar: Not applicable - HPV:  Discussed today - Zostavax vaccine: Not applicable  SCREENING: -Mammogram: Not applicable  - Colonoscopy: Not applicable  - Bone Density: Not applicable  -Hearing Test: Not applicable  -Spirometry: Not applicable   PATIENT COUNSELING:   Advised to take 1 mg of folate supplement per day if capable of pregnancy.   Sexuality: Discussed sexually transmitted diseases, partner selection, use of condoms, avoidance of unintended pregnancy  and contraceptive alternatives.   Advised to avoid cigarette smoking.  I discussed with the patient that most people either abstain from alcohol or drink within safe limits (<=14/week and <=4 drinks/occasion for males, <=7/weeks and <= 3 drinks/occasion for females) and that the risk for alcohol disorders and other health effects rises proportionally with the number of drinks per week and how often a drinker exceeds daily limits.  Discussed cessation/primary prevention of drug use and availability of treatment for abuse.   Diet: Encouraged to adjust caloric intake to maintain  or achieve ideal body weight, to reduce intake of dietary saturated fat and total fat, to limit sodium intake by avoiding high sodium foods and not adding table salt, and to maintain adequate dietary potassium and calcium preferably from fresh fruits, vegetables, and low-fat dairy products.    stressed the importance of regular exercise  Injury prevention: Discussed safety belts, safety helmets, smoke detector, smoking near bedding or upholstery.   Dental health: Discussed importance of regular tooth brushing, flossing, and dental visits.    NEXT  PREVENTATIVE PHYSICAL DUE IN 1 YEAR. Return in about 1 month (around 09/05/2021) for Weight Managment.

## 2021-08-05 NOTE — Assessment & Plan Note (Signed)
Will begin Saxenda daily for weight loss. Side effects and benefits of medication discussed with patient during visit.  Discussed importance of diet and exercise in conjunction with medication to aid in weight loss.  Follow up in 1 month for reevaluation.

## 2021-08-06 ENCOUNTER — Telehealth: Payer: Self-pay

## 2021-08-06 LAB — COMPREHENSIVE METABOLIC PANEL
ALT: 22 IU/L (ref 0–32)
AST: 17 IU/L (ref 0–40)
Albumin/Globulin Ratio: 2 (ref 1.2–2.2)
Albumin: 4.3 g/dL (ref 3.8–4.8)
Alkaline Phosphatase: 90 IU/L (ref 44–121)
BUN/Creatinine Ratio: 7 — ABNORMAL LOW (ref 9–23)
BUN: 5 mg/dL — ABNORMAL LOW (ref 6–20)
Bilirubin Total: 0.3 mg/dL (ref 0.0–1.2)
CO2: 23 mmol/L (ref 20–29)
Calcium: 9.6 mg/dL (ref 8.7–10.2)
Chloride: 100 mmol/L (ref 96–106)
Creatinine, Ser: 0.71 mg/dL (ref 0.57–1.00)
Globulin, Total: 2.1 g/dL (ref 1.5–4.5)
Glucose: 78 mg/dL (ref 65–99)
Potassium: 4.6 mmol/L (ref 3.5–5.2)
Sodium: 136 mmol/L (ref 134–144)
Total Protein: 6.4 g/dL (ref 6.0–8.5)
eGFR: 114 mL/min/{1.73_m2} (ref >59)

## 2021-08-06 LAB — LIPID PANEL
Chol/HDL Ratio: 5.5 ratio — ABNORMAL HIGH (ref 0.0–4.4)
Cholesterol, Total: 227 mg/dL — ABNORMAL HIGH (ref 100–199)
HDL: 41 mg/dL (ref >39)
LDL Chol Calc (NIH): 147 mg/dL — ABNORMAL HIGH (ref 0–99)
Triglycerides: 215 mg/dL — ABNORMAL HIGH (ref 0–149)
VLDL Cholesterol Cal: 39 mg/dL (ref 5–40)

## 2021-08-06 LAB — CBC WITH DIFFERENTIAL/PLATELET
Basophils Absolute: 0.1 10*3/uL (ref 0.0–0.2)
Basos: 1 %
EOS (ABSOLUTE): 0.2 10*3/uL (ref 0.0–0.4)
Eos: 2 %
Hematocrit: 36.5 % (ref 34.0–46.6)
Hemoglobin: 10.9 g/dL — ABNORMAL LOW (ref 11.1–15.9)
Immature Grans (Abs): 0 10*3/uL (ref 0.0–0.1)
Immature Granulocytes: 0 %
Lymphocytes Absolute: 2.5 10*3/uL (ref 0.7–3.1)
Lymphs: 27 %
MCH: 20.5 pg — ABNORMAL LOW (ref 26.6–33.0)
MCHC: 29.9 g/dL — ABNORMAL LOW (ref 31.5–35.7)
MCV: 69 fL — ABNORMAL LOW (ref 79–97)
Monocytes Absolute: 0.6 10*3/uL (ref 0.1–0.9)
Monocytes: 7 %
Neutrophils Absolute: 5.7 10*3/uL (ref 1.4–7.0)
Neutrophils: 63 %
Platelets: 353 10*3/uL (ref 150–450)
RBC: 5.33 x10E6/uL — ABNORMAL HIGH (ref 3.77–5.28)
RDW: 16.8 % — ABNORMAL HIGH (ref 11.7–15.4)
WBC: 9 10*3/uL (ref 3.4–10.8)

## 2021-08-06 LAB — TSH: TSH: 1.33 u[IU]/mL (ref 0.450–4.500)

## 2021-08-06 NOTE — Progress Notes (Signed)
Hi [name].  Overall your lab work looks good.  Your cholesterol is elevated.  I recommend you follow a low fat diet and exercise.  We will recheck at future visits. Otherwise, I will see you in a month.  Let me know if you have any questions.

## 2021-08-06 NOTE — Telephone Encounter (Signed)
PA submitted for Saxenda via cover my meds, waiting approval or denial.  Key: [id number]

## 2021-08-06 NOTE — Telephone Encounter (Signed)
No, medicaid will not pay for any weight loss meds, already submitted a PA

## 2021-08-06 NOTE — Addendum Note (Signed)
Addended by: Larae Grooms on: 08/06/2021 01:23 PM   Modules accepted: Orders

## 2021-08-06 NOTE — Telephone Encounter (Signed)
Are we able to do a prior auth for her saxenda?

## 2021-08-14 LAB — CYTOLOGY - PAP
Adequacy: ABSENT
Comment: NEGATIVE
Diagnosis: NEGATIVE
High risk HPV: POSITIVE — AB

## 2021-08-14 NOTE — Progress Notes (Signed)
Hi [name].  Your PAP came back abnormal with high risk HPV.  I recommend you go to GYN to be further evaluated.  Please let me know if you have any questions.

## 2021-08-18 ENCOUNTER — Other Ambulatory Visit: Payer: Self-pay

## 2021-08-18 ENCOUNTER — Ambulatory Visit
Admission: RE | Admit: 2021-08-18 | Discharge: 2021-08-18 | Disposition: A | Discharge status: Home / Self Care | Payer: Medicaid Other | Source: Ambulatory Visit | Attending: Nurse Practitioner | Admitting: Nurse Practitioner

## 2021-08-18 DIAGNOSIS — R102 Pelvic and perineal pain: Secondary | ICD-10-CM | POA: Insufficient documentation

## 2021-08-19 NOTE — Progress Notes (Signed)
[facility] or [facility] are GYN options.

## 2021-08-21 NOTE — Progress Notes (Signed)
Please let patient know that her Ultrasound was normal.  Please ask her if she is still having pain.  If so we will do further imaging.

## 2021-08-25 NOTE — Addendum Note (Signed)
Addended by: Larae Grooms on: 08/25/2021 02:10 PM   Modules accepted: Orders

## 2021-08-25 NOTE — Progress Notes (Signed)
Please let patient know that I ordered a CT to further evaluate her abdominal pain.

## 2021-09-03 ENCOUNTER — Encounter: Payer: Self-pay | Admitting: Nurse Practitioner

## 2021-09-03 ENCOUNTER — Other Ambulatory Visit: Payer: Self-pay

## 2021-09-03 ENCOUNTER — Ambulatory Visit (INDEPENDENT_AMBULATORY_CARE_PROVIDER_SITE_OTHER): Payer: Medicaid Other | Admitting: Nurse Practitioner

## 2021-09-03 VITALS — BP 137/1 | HR 85 | Ht 65.4 in | Wt 248.0 lb

## 2021-09-03 DIAGNOSIS — R42 Dizziness and giddiness: Secondary | ICD-10-CM

## 2021-09-03 DIAGNOSIS — M5442 Lumbago with sciatica, left side: Secondary | ICD-10-CM

## 2021-09-03 DIAGNOSIS — R002 Palpitations: Secondary | ICD-10-CM | POA: Diagnosis not present

## 2021-09-03 DIAGNOSIS — G8929 Other chronic pain: Secondary | ICD-10-CM

## 2021-09-03 DIAGNOSIS — D649 Anemia, unspecified: Secondary | ICD-10-CM

## 2021-09-03 NOTE — Progress Notes (Signed)
BP (!) 137/1   Pulse 85   Ht 5' 5.4" (1.661 m)   Wt 248 lb (112.5 kg)   BMI 40.77 kg/m    Subjective:    Patient ID: [name], female    DOB: 1986/08/16, 35 y.o.   MRN: [id number]  HPI: [name] is a 35 y.o. female  Chief Complaint  Patient presents with   Palpitations    Patient states for 2-3 weeks she is having dizziness.  Happens when she turns her head.  Feels like she is having palpitations when this happens.   DIZZINESS Duration: weeks Description of symptoms: lightheaded and room spinning Duration of episode: seconds Dizziness frequency: no history of the same Provoking factors:  sudden movements Aggravating factors:   sudden movements Triggered by rolling over in bed: no Triggered by bending over: no Aggravated by head movement: yes Aggravated by exertion, coughing, loud noises: no Recent head injury: no Recent or current viral symptoms: no History of vasovagal episodes: no Nausea: no Vomiting: no Tinnitus: no Hearing loss: no Aural fullness: no Headache: yes Photophobia/phonophobia: no Unsteady gait: yes Postural instability: no Diplopia, dysarthria, dysphagia or weakness: no Related to exertion: no Pallor: no Diaphoresis: no Dyspnea: no Chest pain: no  Relevant past medical, surgical, family and social history reviewed and updated as indicated. Interim medical history since our last visit reviewed. Allergies and medications reviewed and updated.  Review of Systems  Constitutional:  Negative for diaphoresis.  Respiratory:  Negative for chest tightness and shortness of breath.   Cardiovascular:  Positive for palpitations.  Neurological:  Positive for dizziness, light-headedness and headaches.   Per HPI unless specifically indicated above     Objective:    BP (!) 137/1   Pulse 85   Ht 5' 5.4" (1.661 m)   Wt 248 lb (112.5 kg)   BMI 40.77 kg/m   Wt Readings from Last 3 Encounters:  09/03/21 248 lb (112.5 kg)  08/05/21  243 lb 6 oz (110.4 kg)  06/26/21 238 lb (108 kg)    Physical Exam Vitals and nursing note reviewed.  Constitutional:      General: She is not in acute distress.    Appearance: Normal appearance. She is obese. She is not ill-appearing, toxic-appearing or diaphoretic.  HENT:     Head: Normocephalic.     Right Ear: External ear normal.     Left Ear: External ear normal.     Nose: Nose normal.     Mouth/Throat:     Mouth: Mucous membranes are moist.     Pharynx: Oropharynx is clear.  Eyes:     General:        Right eye: No discharge.        Left eye: No discharge.     Extraocular Movements: Extraocular movements intact.     Conjunctiva/sclera: Conjunctivae normal.     Pupils: Pupils are equal, round, and reactive to light.  Cardiovascular:     Rate and Rhythm: Normal rate and regular rhythm.     Heart sounds: No murmur heard. Pulmonary:     Effort: Pulmonary effort is normal. No respiratory distress.     Breath sounds: Normal breath sounds. No wheezing or rales.  Musculoskeletal:     Cervical back: Normal range of motion and neck supple.  Skin:    General: Skin is warm and dry.     Capillary Refill: Capillary refill takes less than 2 seconds.  Neurological:     General: No focal  deficit present.     Mental Status: She is alert and oriented to person, place, and time. Mental status is at baseline.  Psychiatric:        Mood and Affect: Mood normal.        Behavior: Behavior normal.        Thought Content: Thought content normal.        Judgment: Judgment normal.    Results for orders placed or performed in visit on 08/05/21  CBC with Differential/Platelet  Result Value Ref Range   WBC 9.0 3.4 - 10.8 x10E3/uL   RBC 5.33 (H) 3.77 - 5.28 x10E6/uL   Hemoglobin 10.9 (L) 11.1 - 15.9 g/dL   Hematocrit 56.7 87.0 - 46.6 %   MCV 69 (L) 79 - 97 fL   MCH 20.5 (L) 26.6 - 33.0 pg   MCHC 29.9 (L) 31.5 - 35.7 g/dL   RDW 53.4 (H) 95.4 - 91.7 %   Platelets 353 150 - 450 x10E3/uL    Neutrophils 63 Not Estab. %   Lymphs 27 Not Estab. %   Monocytes 7 Not Estab. %   Eos 2 Not Estab. %   Basos 1 Not Estab. %   Neutrophils Absolute 5.7 1.4 - 7.0 x10E3/uL   Lymphocytes Absolute 2.5 0.7 - 3.1 x10E3/uL   Monocytes Absolute 0.6 0.1 - 0.9 x10E3/uL   EOS (ABSOLUTE) 0.2 0.0 - 0.4 x10E3/uL   Basophils Absolute 0.1 0.0 - 0.2 x10E3/uL   Immature Granulocytes 0 Not Estab. %   Immature Grans (Abs) 0.0 0.0 - 0.1 x10E3/uL  Comprehensive metabolic panel  Result Value Ref Range   Glucose 78 65 - 99 mg/dL   BUN 5 (L) 6 - 20 mg/dL   Creatinine, Ser 5.88 0.57 - 1.00 mg/dL   eGFR 344 >35 CX/LUL/4.33   BUN/Creatinine Ratio 7 (L) 9 - 23   Sodium 136 134 - 144 mmol/L   Potassium 4.6 3.5 - 5.2 mmol/L   Chloride 100 96 - 106 mmol/L   CO2 23 20 - 29 mmol/L   Calcium 9.6 8.7 - 10.2 mg/dL   Total Protein 6.4 6.0 - 8.5 g/dL   Albumin 4.3 3.8 - 4.8 g/dL   Globulin, Total 2.1 1.5 - 4.5 g/dL   Albumin/Globulin Ratio 2.0 1.2 - 2.2   Bilirubin Total 0.3 0.0 - 1.2 mg/dL   Alkaline Phosphatase 90 44 - 121 IU/L   AST 17 0 - 40 IU/L   ALT 22 0 - 32 IU/L  Lipid panel  Result Value Ref Range   Cholesterol, Total 227 (H) 100 - 199 mg/dL   Triglycerides 708 (H) 0 - 149 mg/dL   HDL 41 >99 mg/dL   VLDL Cholesterol Cal 39 5 - 40 mg/dL   LDL Chol Calc (NIH) 993 (H) 0 - 99 mg/dL   Chol/HDL Ratio 5.5 (H) 0.0 - 4.4 ratio  TSH  Result Value Ref Range   TSH 1.330 0.450 - 4.500 uIU/mL  Urinalysis, Routine w reflex microscopic  Result Value Ref Range   Specific Gravity, UA 1.015 1.005 - 1.030   pH, UA 7.5 5.0 - 7.5   Color, UA Yellow Yellow   Appearance Ur Clear Clear   Leukocytes,UA Negative Negative   Protein,UA Negative Negative/Trace   Glucose, UA Negative Negative   Ketones, UA Negative Negative   RBC, UA Negative Negative   Bilirubin, UA Negative Negative   Urobilinogen, Ur 0.2 0.2 - 1.0 mg/dL   Nitrite, UA Negative Negative  Cytology - PAP  Result Value Ref Range   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   Diagnosis      - Negative for intraepithelial lesion or malignancy (NILM)   Comment Normal Reference Range HPV - Negative       Assessment & Plan:   Problem List Items Addressed This Visit   None Visit Diagnoses     Dizziness    -  Primary   Recommend using Zyrtec OTC for the next 3 days then PRN when symptoms arise.   Relevant Orders   EKG 12-Lead (Completed)   Thyroid Panel With TSH   Comp Met (CMET)   CBC w/Diff   Palpitations       EKG ordered during office visit. Labs ordered.    Relevant Orders   EKG 12-Lead (Completed)   Thyroid Panel With TSH   Comp Met (CMET)   CBC w/Diff   Ambulatory referral to Cardiology   Chronic midline low back pain with left-sided sciatica       Patient requested referral to pain management for ongoing low back pain.     Relevant Orders   Ambulatory referral to Pain Clinic        Follow up plan: Return if symptoms worsen or fail to improve.

## 2021-09-03 NOTE — Progress Notes (Signed)
Inverted T waves on EKG. Will refer to Cardiology.

## 2021-09-04 ENCOUNTER — Telehealth: Payer: Self-pay

## 2021-09-04 LAB — COMPREHENSIVE METABOLIC PANEL
ALT: 23 IU/L (ref 0–32)
AST: 16 IU/L (ref 0–40)
Albumin/Globulin Ratio: 2.2 (ref 1.2–2.2)
Albumin: 4.2 g/dL (ref 3.8–4.8)
Alkaline Phosphatase: 83 IU/L (ref 44–121)
BUN/Creatinine Ratio: 18 (ref 9–23)
BUN: 12 mg/dL (ref 6–20)
Bilirubin Total: <0.2 mg/dL (ref 0.0–1.2)
CO2: 23 mmol/L (ref 20–29)
Calcium: 8.8 mg/dL (ref 8.7–10.2)
Chloride: 103 mmol/L (ref 96–106)
Creatinine, Ser: 0.68 mg/dL (ref 0.57–1.00)
Globulin, Total: 1.9 g/dL (ref 1.5–4.5)
Glucose: 73 mg/dL (ref 70–99)
Potassium: 4.2 mmol/L (ref 3.5–5.2)
Sodium: 139 mmol/L (ref 134–144)
Total Protein: 6.1 g/dL (ref 6.0–8.5)
eGFR: 116 mL/min/{1.73_m2} (ref >59)

## 2021-09-04 LAB — CBC WITH DIFFERENTIAL/PLATELET
Basophils Absolute: 0.1 10*3/uL (ref 0.0–0.2)
Basos: 1 %
EOS (ABSOLUTE): 0.2 10*3/uL (ref 0.0–0.4)
Eos: 2 %
Hematocrit: 33.1 % — ABNORMAL LOW (ref 34.0–46.6)
Hemoglobin: 9.5 g/dL — ABNORMAL LOW (ref 11.1–15.9)
Immature Grans (Abs): 0 10*3/uL (ref 0.0–0.1)
Immature Granulocytes: 0 %
Lymphocytes Absolute: 3.1 10*3/uL (ref 0.7–3.1)
Lymphs: 35 %
MCH: 19.8 pg — ABNORMAL LOW (ref 26.6–33.0)
MCHC: 28.7 g/dL — ABNORMAL LOW (ref 31.5–35.7)
MCV: 69 fL — ABNORMAL LOW (ref 79–97)
Monocytes Absolute: 0.6 10*3/uL (ref 0.1–0.9)
Monocytes: 7 %
Neutrophils Absolute: 4.9 10*3/uL (ref 1.4–7.0)
Neutrophils: 55 %
Platelets: 295 10*3/uL (ref 150–450)
RBC: 4.81 x10E6/uL (ref 3.77–5.28)
RDW: 17.5 % — ABNORMAL HIGH (ref 11.7–15.4)
WBC: 8.9 10*3/uL (ref 3.4–10.8)

## 2021-09-04 LAB — THYROID PANEL WITH TSH
Free Thyroxine Index: 1.8 (ref 1.2–4.9)
T3 Uptake Ratio: 24 % (ref 24–39)
T4, Total: 7.7 ug/dL (ref 4.5–12.0)
TSH: 1.92 u[IU]/mL (ref 0.450–4.500)

## 2021-09-04 NOTE — Progress Notes (Signed)
HI [name].  Overall your blood work looks good.  No thyroid, liver, kidney, or electrolyte concerns.  Your complete blood count does show some anemia.  I would like to draw some more blood work to determine the kind of anemia and see if we can get you on treatment.  I have placed the orders for you to come in as a lab visit.  Please let me know if you have any questions. Just make a lab appt.

## 2021-09-04 NOTE — Addendum Note (Signed)
Addended by: Larae Grooms on: 09/04/2021 09:53 AM   Modules accepted: Orders

## 2021-09-04 NOTE — Telephone Encounter (Signed)
PA for Saxenda initiated and submitted via Cover My Meds. Key: [id number]

## 2021-09-08 NOTE — Telephone Encounter (Signed)
This patient would like to see pain management.  Can you help her with an appointment?

## 2021-09-09 ENCOUNTER — Ambulatory Visit: Payer: Medicaid Other | Admitting: Nurse Practitioner

## 2021-09-11 ENCOUNTER — Encounter: Payer: Self-pay | Admitting: Cardiology

## 2021-09-11 ENCOUNTER — Ambulatory Visit (INDEPENDENT_AMBULATORY_CARE_PROVIDER_SITE_OTHER): Payer: Medicaid Other

## 2021-09-11 ENCOUNTER — Other Ambulatory Visit: Payer: Self-pay

## 2021-09-11 ENCOUNTER — Ambulatory Visit (INDEPENDENT_AMBULATORY_CARE_PROVIDER_SITE_OTHER): Payer: Medicaid Other | Admitting: Cardiology

## 2021-09-11 DIAGNOSIS — F603 Borderline personality disorder: Secondary | ICD-10-CM

## 2021-09-11 DIAGNOSIS — R002 Palpitations: Secondary | ICD-10-CM

## 2021-09-11 DIAGNOSIS — R42 Dizziness and giddiness: Secondary | ICD-10-CM

## 2021-09-11 NOTE — Assessment & Plan Note (Signed)
Symptoms sound likely consistent with BPV-notably worse with turning her head from side to side not up and down.  She does not appear to be orthostatic.  Pressure check today confirm that.  All dizziness episodes seem to be associated with palpitations.  Plan: Exclude arrhythmia genic etiology with 3-day Zio patch monitor.  Consider vestibular dizziness or vertigo.

## 2021-09-11 NOTE — Progress Notes (Signed)
Primary Care Provider: Larae Grooms, NP [facility] Cardiologist: None Electrophysiologist: None  Clinic Note: Chief Complaint  Patient presents with   New Patient (Initial Visit)    Ref by Larae Grooms, NP for dizziness, palpitations and abnormal EKG. Medications reviewed by the patient verbally. Patient c/o dizziness with turning head and has a "fluttering" in chest.     ===================================  ASSESSMENT/PLAN   Problem List Items Addressed This Visit     Borderline personality disorder (HCC)    Seems to be in check.  Followed by psychiatry. Will need to reassure with current evaluation.      Dizzinesses    Symptoms sound likely consistent with BPV-notably worse with turning her head from side to side not up and down.  She does not appear to be orthostatic.  Pressure check today confirm that.  All dizziness episodes seem to be associated with palpitations.  Plan: Exclude arrhythmia genic etiology with 3-day Zio patch monitor. Consider vestibular dizziness or vertigo.      Palpitations    Short little bursts of palpitation associate with dizziness.  Symptoms do not last more than 10 seconds.  I suspect she may very well be having short bursts of either PAT or PSVT versus simply PACs and PVCs.  Regardless, would be reluctant to treat with medications.  Likely exacerbated by significant amount of caffeine intake with ice coffee.  Plan: 3-day Zio patch monitor Try to cut down caffeine intake and increase oral hydration.      Relevant Orders   EKG 12-Lead   LONG TERM MONITOR (3-14 DAYS)   Morbid obesity (HCC) - Primary (Chronic)    ===================================  HPI:    [name] is a morbidly obese 35 y.o. female who is being seen today for the evaluation of Dizziness and Palpitations at the request of Larae Grooms, NP.  [name] was last seen on September 03, 2021 by Larae Grooms, NP with a complaint of 2  to 3 weeks of dizziness that occurs when she turns her head.  Per report this is associated with palpitations.  She describes symptoms of lightheadedness and sense of room spinning.  Provoked by sudden movements.  No near syncope or syncope.  Blood work did show mild anemia.  EKG was noted to have T wave inversions in V1 and V2 (not all that different than 2019).  Recent Hospitalizations: None  Reviewed  CV studies:    The following studies were reviewed today: (if available, images/films reviewed: From Epic Chart or Care Everywhere) None:   Interval History:   [name] presents here today to discuss her dizziness episodes.  She describes having frequent episodes throughout the course of the day of dizziness associated with palpitations.  She says that the dizziness is made worse with turning her head from side to side-not up and down.  It can happen both while standing or seated.  It can happen if she takes off walking quickly, but usually with turning her head.  When she feels the dizziness, she also feels her heart pounding and beating fast.  She does say that these episodes usually last maybe 3 to 4 seconds and no more than 10 seconds.  As such, does not last enough for her to actually feel like she is in a pass out.  She does feel little bit lightheaded, but no true near syncope or syncope.  These episodes did not occur while lying down.  She is concerned about her EKG-T wave  inversions are noted in precordial leads-I explained to her that this is a nonspecific relatively normal finding.  The EKG done at PCPs office suggested 1  AVB with PR interval 230 ms.  Is 160 ms today, and in 2019 was 190 ms.  I do not think that this would be anything related to dizziness..  Otherwise, she is stable from a cardiac standpoint.  She is concerned because she has been told she has congestive heart failure in the past-likely related to her lower extremity swelling.  She has some mild varicose veins  as well.  No PND or orthopnea.  No exertional dyspnea.   CV Review of Symptoms (Summary) Cardiovascular ROS: no chest pain or dyspnea on exertion positive for - edema, palpitations, rapid heart rate, and associated with brief bouts of dizziness.  Symptoms do not last more than 10 seconds. negative for - orthopnea, paroxysmal nocturnal dyspnea, shortness of breath, or no symptoms of syncope or near syncope, TIA or amaurosis fugax, claudication  REVIEWED OF SYSTEMS   Review of Systems  Constitutional:  Negative for malaise/fatigue (She does get tired at end of the day) and weight loss.  HENT:  Negative for nosebleeds.   Respiratory:  Negative for cough and shortness of breath.   Cardiovascular:  Positive for leg swelling (Chronic ankle swelling exam the day.).  Gastrointestinal:  Negative for blood in stool and melena.  Genitourinary:  Negative for hematuria.  Musculoskeletal:  Negative for joint pain and myalgias.  Neurological:  Positive for dizziness (Worse with turning her head, or moving quickly.).  Psychiatric/Behavioral:  Negative for depression (Depression seems to be well and checked with Zoloft) and memory loss. The patient is not nervous/anxious and does not have insomnia.   pertinent symptoms noted above  I have reviewed and (if needed) personally updated the patient's problem list, medications, allergies, past medical and surgical history, social and family history.   PAST MEDICAL HISTORY   Past Medical History:  Diagnosis Date   Depression    Well-controlled with Zoloft   Gestational diabetes    Has prediabetes now.   Headache    Heroin abuse (HCC)    Migraines    Morbid obesity with BMI of 40.0-44.9, adult [facility])     PAST SURGICAL HISTORY   Past Surgical History:  Procedure Laterality Date   ANKLE SURGERY Left 2012   CESAREAN SECTION  2011   CESAREAN SECTION WITH BILATERAL TUBAL LIGATION Bilateral 06/15/2018   Procedure: CESAREAN SECTION WITH BILATERAL TUBAL  LIGATION;  Surgeon: Suzy Bouchard, MD;  Location: [facility] ORS;  Service: Obstetrics;  Laterality: Bilateral;   CHOLECYSTECTOMY N/A 09/27/2016   Procedure: LAPAROSCOPIC CHOLECYSTECTOMY WITH INTRAOPERATIVE CHOLANGIOGRAM;  Surgeon: Lattie Haw, MD;  Location: [facility] ORS;  Service: General;  Laterality: N/A;   DILATION AND CURETTAGE OF UTERUS  2013   TONSILLECTOMY  2007    Immunization History  Administered Date(s) Administered   Influenza-Unspecified 01/11/2018   Moderna SARS-COV2 Booster Vaccination 04/25/2020, 05/23/2020   Pneumococcal Polysaccharide-23 06/26/2021   Tdap 03/28/2018    MEDICATIONS/ALLERGIES   Current Meds  Medication Sig   sertraline (ZOLOFT) 100 MG tablet TK 2 TS PO HS    Allergies  Allergen Reactions   Clindamycin/Lincomycin Anaphylaxis and Rash   Amoxicillin Rash    Has patient had a PCN reaction causing immediate rash, facial/tongue/throat swelling, SOB or lightheadedness with hypotension: Yes Has patient had a PCN reaction causing severe rash involving mucus membranes or skin necrosis: No Has patient had a PCN  reaction that required hospitalization: No Has patient had a PCN reaction occurring within the last 10 years: No If all of the above answers are "NO", then may proceed with Cephalosporin use.     SOCIAL HISTORY/FAMILY HISTORY   Reviewed in Epic:   Social History   Tobacco Use   Smoking status: Every Day    Packs/day: 1.00    Years: 13.00    Pack years: 13.00    Types: Cigarettes   Smokeless tobacco: Never  Vaping Use   Vaping Use: Never used  Substance Use Topics   Alcohol use: No   Drug use: Not Currently   Social History   Social History Narrative   Divorced mother of 2 (11 & 3 - as of 09/2021)   Works @ [employer]   NO routine exercise -- active @ work   Smokes 1 PPD - not interested in quitting.   Drinks "lots" of coffee (drinks cold coffee most of the day)   Family History  Problem Relation Age of Onset    Arthritis Mother    Cancer Mother        skin   Hyperlipidemia Mother        takes meds   Coronary artery disease Mother        non-obstructive   Cancer Father        Lung   Anxiety disorder Sister    Depression Sister    Heart attack Maternal Grandfather 32   Coronary artery disease Maternal Grandfather 2       s/p CABG   ADD / ADHD Son     OBJCTIVE -PE, EKG, labs   Wt Readings from Last 3 Encounters:  09/11/21 246 lb 8 oz (111.8 kg)  09/03/21 248 lb (112.5 kg)  08/05/21 243 lb 6 oz (110.4 kg)    Physical Exam: BP 129/86 (BP Location: Left Arm, Patient Position: Sitting, Cuff Size: Large)   Pulse 70   Ht 5\' 4"  (1.626 m)   Wt 246 lb 8 oz (111.8 kg)   SpO2 99%   BMI 42.31 kg/m  Orthostatic VS for the past 24 hrs:  BP- Lying Pulse- Lying BP- Sitting Pulse- Sitting BP- Standing at 0 minutes Pulse- Standing at 0 minutes  09/11/21 1000 131/79 76 131/86 80 129/89 81   Physical Exam Vitals reviewed.  Constitutional:      General: She is not in acute distress.    Appearance: Normal appearance. She is obese. She is not ill-appearing.  HENT:     Head: Normocephalic and atraumatic.  Eyes:     Extraocular Movements: Extraocular movements intact.     Pupils: Pupils are equal, round, and reactive to light.  Neck:     Vascular: No carotid bruit or JVD.  Cardiovascular:     Rate and Rhythm: Normal rate and regular rhythm. No extrasystoles are present.    Chest Wall: PMI is not displaced.     Pulses: Intact distal pulses.     Heart sounds: S1 normal and S2 normal. Heart sounds are distant. No murmur heard.   No friction rub. No gallop.  Pulmonary:     Effort: Pulmonary effort is normal. No respiratory distress.     Breath sounds: Normal breath sounds.  Chest:     Chest wall: No tenderness.  Abdominal:     General: Bowel sounds are normal. There is no distension.     Palpations: Abdomen is soft. There is no mass.     Tenderness: There  is no abdominal tenderness.      Comments: Obese.  No HSM or bruit.  Musculoskeletal:        General: Swelling (Trivial bilateral ankle) present. Normal range of motion.     Cervical back: Normal range of motion and neck supple.  Skin:    General: Skin is warm and dry.  Neurological:     General: No focal deficit present.     Mental Status: She is alert and oriented to person, place, and time.     Motor: No weakness.     Gait: Gait normal.  Psychiatric:        Mood and Affect: Mood normal.        Behavior: Behavior normal.        Thought Content: Thought content normal.        Judgment: Judgment normal.     Adult ECG Report  Rate: 70;  Rhythm: normal sinus rhythm and RSR prime and T wave versions in V1 and V2 (likely normal variant).  Computer reads cannot rule out inferior infarct-inaccurate/marked out. ; PR interval now 160, no longer suggestive of 1  AV block.  Narrative Interpretation: Essentially normal EKG.  (No change from PCP EKG as well as EKG in 2019)  Recent Labs: Reviewed Lab Results  Component Value Date   CHOL 227 (H) 08/05/2021   HDL 41 08/05/2021   LDLCALC 147 (H) 08/05/2021   TRIG 215 (H) 08/05/2021   CHOLHDL 5.5 (H) 08/05/2021   Lab Results  Component Value Date   CREATININE 0.68 09/03/2021   BUN 12 09/03/2021   NA 139 09/03/2021   K 4.2 09/03/2021   CL 103 09/03/2021   CO2 23 09/03/2021   CBC Latest Ref Rng & Units 09/03/2021 08/05/2021 06/26/2021  WBC 3.4 - 10.8 x10E3/uL 8.9 9.0 9.3  Hemoglobin 11.1 - 15.9 g/dL 1.6(X) 10.9(L) 11.3  Hematocrit 34.0 - 46.6 % 33.1(L) 36.5 37.8  Platelets 150 - 450 x10E3/uL 295 353 343    Lab Results  Component Value Date   HGBA1C 5.9 (H) 06/26/2021   Lab Results  Component Value Date   TSH 1.920 09/03/2021    ==================================================  COVID-19 Education: The signs and symptoms of COVID-19 were discussed with the patient and how to seek care for testing (follow up with PCP or arrange E-visit).    I spent a total of  23 minutes with the patient spent in direct patient consultation.  Additional time spent with chart review  / charting (studies, outside notes, etc): 19 min Total Time: 44 min  Current medicines are reviewed at length with the patient today.  (+/- concerns) n/a  This visit occurred during the SARS-CoV-2 public health emergency.  Safety protocols were in place, including screening questions prior to the visit, additional usage of staff PPE, and extensive cleaning of exam room while observing appropriate contact time as indicated for disinfecting solutions.  Notice: This dictation was prepared with Dragon dictation along with smart phrase technology. Any transcriptional errors that result from this process are unintentional and may not be corrected upon review.   Studies Ordered:  Orders Placed This Encounter  Procedures   LONG TERM MONITOR (3-14 DAYS)   EKG 12-Lead    Patient Instructions / Medication Changes & Studies & Tests Ordered   Patient Instructions  Medication Instructions:  Your physician recommends that you continue on your current medications as directed. Please refer to the Current Medication list given to you today.  *If you need a refill  on your cardiac medications before your next appointment, please call your pharmacy*   Lab Work: None ordered  If you have labs (blood work) drawn today and your tests are completely normal, you will receive your results only by: MyChart Message (if you have MyChart) OR A paper copy in the mail If you have any lab test that is abnormal or we need to change your treatment, we will call you to review the results.   Testing/Procedures: Your physician has recommended that you wear a Zio XT monitor. (To be worn for 3 days)    Follow-Up: At [facility], you and your health needs are our priority.  As part of our continuing mission to provide you with exceptional heart care, we have created designated Provider Care Teams.  These  Care Teams include your primary Cardiologist (physician) and Advanced Practice Providers (APPs -  Physician Assistants and Nurse Practitioners) who all work together to provide you with the care you need, when you need it.  We recommend signing up for the patient portal called "MyChart".  Sign up information is provided on this After Visit Summary.  MyChart is used to connect with patients for Virtual Visits (Telemedicine).  Patients are able to view lab/test results, encounter notes, upcoming appointments, etc.  Non-urgent messages can be sent to your provider as well.   To learn more about what you can do with MyChart, go to [email or web address].    Your next appointment:   4 week(s)  The format for your next appointment:   In Person  Provider:   You may see Dr. Herbie Baltimore or one of the following Advanced Practice Providers on your designated Care Team:   Nicolasa Ducking, NP Eula Listen, PA-C Marisue Ivan, PA-C Cadence Fransico Michael, [place]   Other Instructions Dr. Herbie Baltimore recommends that you decrease your caffeine in take and incase you regular fluid intake.   Bryan Lemma, M.D., M.S. Interventional Cardiologist   Pager # [phone] Phone # [phone] [address] [place], [place] [address]   Thank you for choosing Heartcare at [facility]!!

## 2021-09-11 NOTE — Patient Instructions (Addendum)
Medication Instructions:  Your physician recommends that you continue on your current medications as directed. Please refer to the Current Medication list given to you today.  *If you need a refill on your cardiac medications before your next appointment, please call your pharmacy*   Lab Work: None ordered  If you have labs (blood work) drawn today and your tests are completely normal, you will receive your results only by: MyChart Message (if you have MyChart) OR A paper copy in the mail If you have any lab test that is abnormal or we need to change your treatment, we will call you to review the results.   Testing/Procedures: Your physician has recommended that you wear a Zio XT monitor. (To be worn for 3 days)  This monitor is a medical device that records the heart's electrical activity. Doctors most often use these monitors to diagnose arrhythmias. Arrhythmias are problems with the speed or rhythm of the heartbeat. The monitor is a small device applied to your chest. You can wear one while you do your normal daily activities. While wearing this monitor if you have any symptoms to push the button and record what you felt. Once you have worn this monitor for the period of time provider prescribed (Usually 14 days), you will return the monitor device in the postage paid box. Once it is returned they will download the data collected and provide [place] with a report which the provider will then review and we will call you with those results. Important tips:  Avoid showering during the first 24 hours of wearing the monitor. Avoid excessive sweating to help maximize wear time. Do not submerge the device, no hot tubs, and no swimming pools. Keep any lotions or oils away from the patch. After 24 hours you may shower with the patch on. Take brief showers with your back facing the shower head.  Do not remove patch once it has been placed because that will interrupt data and decrease adhesive wear  time. Push the button when you have any symptoms and write down what you were feeling. Once you have completed wearing your monitor, remove and place into box which has postage paid and place in your outgoing mailbox.  If for some reason you have misplaced your box then call our office and we can provide another box and/or mail it off for you.      Follow-Up: At [facility], you and your health needs are our priority.  As part of our continuing mission to provide you with exceptional heart care, we have created designated Provider Care Teams.  These Care Teams include your primary Cardiologist (physician) and Advanced Practice Providers (APPs -  Physician Assistants and Nurse Practitioners) who all work together to provide you with the care you need, when you need it.  We recommend signing up for the patient portal called "MyChart".  Sign up information is provided on this After Visit Summary.  MyChart is used to connect with patients for Virtual Visits (Telemedicine).  Patients are able to view lab/test results, encounter notes, upcoming appointments, etc.  Non-urgent messages can be sent to your provider as well.   To learn more about what you can do with MyChart, go to [email or web address].    Your next appointment:   4 week(s)  The format for your next appointment:   In Person  Provider:   You may see Dr. Herbie Baltimore or one of the following Advanced Practice Providers on your designated Care Team:   Nicolasa Ducking,  NP Eula Listen, PA-C Marisue Ivan, PA-C Cadence Fransico Michael, [place]   Other Instructions Dr. Herbie Baltimore recommends that you decrease your caffeine in take and incase you regular fluid intake.

## 2021-09-11 NOTE — Assessment & Plan Note (Signed)
Seems to be in check.  Followed by psychiatry. Will need to reassure with current evaluation.

## 2021-09-11 NOTE — Assessment & Plan Note (Signed)
Short little bursts of palpitation associate with dizziness.  Symptoms do not last more than 10 seconds.  I suspect she may very well be having short bursts of either PAT or PSVT versus simply PACs and PVCs.  Regardless, would be reluctant to treat with medications.  Likely exacerbated by significant amount of caffeine intake with ice coffee.  Plan: 3-day Zio patch monitor  Try to cut down caffeine intake and increase oral hydration.

## 2021-09-12 ENCOUNTER — Ambulatory Visit
Admission: RE | Admit: 2021-09-12 | Discharge: 2021-09-12 | Disposition: A | Discharge status: Home / Self Care | Payer: Medicaid Other | Source: Ambulatory Visit | Attending: Nurse Practitioner | Admitting: Nurse Practitioner

## 2021-09-12 DIAGNOSIS — R102 Pelvic and perineal pain: Secondary | ICD-10-CM | POA: Insufficient documentation

## 2021-09-15 NOTE — Progress Notes (Signed)
Hi [name].  There is no evidence to the cause of your pain.  Would you like to see a Gastroenterologist to look into the pain further?

## 2021-09-16 ENCOUNTER — Other Ambulatory Visit: Payer: Medicaid Other

## 2021-09-30 ENCOUNTER — Ambulatory Visit (INDEPENDENT_AMBULATORY_CARE_PROVIDER_SITE_OTHER): Payer: Medicaid Other | Admitting: Nurse Practitioner

## 2021-09-30 ENCOUNTER — Encounter: Payer: Self-pay | Admitting: Nurse Practitioner

## 2021-09-30 ENCOUNTER — Other Ambulatory Visit: Payer: Self-pay

## 2021-09-30 VITALS — BP 106/68 | HR 74 | Temp 97.9°F | Wt 256.0 lb

## 2021-09-30 DIAGNOSIS — Z23 Encounter for immunization: Secondary | ICD-10-CM

## 2021-09-30 DIAGNOSIS — R600 Localized edema: Secondary | ICD-10-CM | POA: Diagnosis not present

## 2021-09-30 MED ORDER — FUROSEMIDE 20 MG PO TABS: 20.0000 mg | ORAL_TABLET | Freq: Every day | ORAL | 0 refills | Status: DC

## 2021-09-30 NOTE — Patient Instructions (Signed)
Please wear compression socks daily, then take them off at night We will start lasix for 7 days and then follow up for labs and visit to see how the swelling is doing

## 2021-09-30 NOTE — Addendum Note (Signed)
Addended by: Pablo Ledger on: 09/30/2021 05:23 PM   Modules accepted: Orders

## 2021-09-30 NOTE — Assessment & Plan Note (Addendum)
Acute x1 week after taking 1 day of gabapentin. Will treat with 7 days of lasix. Encouraged her to elevate her legs while sitting and to wear compression socks during the day. Will have her follow-up in 1 week and to check BMP at this visit. Gabapentin added to allergy list.

## 2021-09-30 NOTE — Progress Notes (Signed)
Acute Office Visit  Subjective:    Patient ID: [name], female    DOB: 06/25/1986, 35 y.o.   MRN: [id number]  Chief Complaint  Patient presents with   Edema    Pt states she has had bilateral leg edema for the last weeks, states her legs are in a lot of pain    HPI Patient is in today for leg swelling. Started after taking gabapentin. She has had this in the past but not this bad. Legs are tender to touch, describes burning. Pain is about 15 out of 10. Denies shortness of breath and chest pain. She has taken 3 days of lasix from her pain management doctor. The swelling improved and then worsened again when stopping.   Past Medical History:  Diagnosis Date   Depression    Well-controlled with Zoloft   Gestational diabetes    Has prediabetes now.   Headache    Heroin abuse ([facility])    Migraines    Morbid obesity with BMI of 40.0-44.9, adult [facility])     Past Surgical History:  Procedure Laterality Date   ANKLE SURGERY Left 2012   CESAREAN SECTION  2011   CESAREAN SECTION WITH BILATERAL TUBAL LIGATION Bilateral 06/15/2018   Procedure: CESAREAN SECTION WITH BILATERAL TUBAL LIGATION;  Surgeon: Boykin Nearing, MD;  Location: [facility] ORS;  Service: Obstetrics;  Laterality: Bilateral;   CHOLECYSTECTOMY N/A 09/27/2016   Procedure: LAPAROSCOPIC CHOLECYSTECTOMY WITH INTRAOPERATIVE CHOLANGIOGRAM;  Surgeon: Florene Glen, MD;  Location: [facility] ORS;  Service: General;  Laterality: N/A;   [place] CURETTAGE OF UTERUS  2013   TONSILLECTOMY  2007    Family History  Problem Relation Age of Onset   Arthritis Mother    Cancer Mother        skin   Hyperlipidemia Mother        takes meds   Coronary artery disease Mother        non-obstructive   Cancer Father        Lung   Anxiety disorder Sister    Depression Sister    Heart attack Maternal Grandfather 32   Coronary artery disease Maternal Grandfather 37       s/p CABG   ADD / ADHD Son     Social History    Socioeconomic History   Marital status: Divorced    Spouse name: Not on file   Number of children: 2   Years of education: Not on file   Highest education level: Not on file  Occupational History    Employer: [employer]  Tobacco Use   Smoking status: Every Day    Packs/day: 1.00    Years: 13.00    Pack years: 13.00    Types: Cigarettes   Smokeless tobacco: Never  Vaping Use   Vaping Use: Never used  Substance and Sexual Activity   Alcohol use: No   Drug use: Not Currently   Sexual activity: Yes    Birth control/protection: Surgical  Other Topics Concern   Not on file  Social History Narrative   Divorced mother of 2 (49 & 3 - as of 09/2021)   Works @ [employer]   NO routine exercise -- active @ work   Smokes 1 PPD - not interested in quitting.   Drinks "lots" of coffee (drinks cold coffee most of the day)   Social Determinants of Health   Financial Resource Strain: Not on file  Food Insecurity: Not on file  Transportation Needs:  Not on file  Physical Activity: Not on file  Stress: Not on file  Social Connections: Not on file  Intimate Partner Violence: Not on file    Outpatient Medications Prior to Visit  Medication Sig Dispense Refill   Buprenorphine HCl-Naloxone HCl (ZUBSOLV) 5.7-1.4 MG SUBL Place 1 tablet under the tongue 2 (two) times daily.     sertraline (ZOLOFT) 100 MG tablet TK 2 TS PO HS     Liraglutide -Weight Management (SAXENDA) 18 MG/3ML SOPN Inject 0.6 mg into the skin daily. Inject 0.$RemoveBeforeD'6mg'qrYzoINYPVWhLW$  into the skin daily, after 1 week increase to 1.$RemoveBefor'2mg'TnFeQvevFadU$  daily, week 3 increase to 2.$RemoveBefor'4mg'sgxredtzWSqF$  daily, week 4 inject $Remove'3mg'paFgqpP$  daily (Patient not taking: No sig reported) 12 mL 0   No facility-administered medications prior to visit.    Allergies  Allergen Reactions   Clindamycin/Lincomycin Anaphylaxis and Rash   Gabapentin Swelling    Leg swelling   Amoxicillin Rash    Has patient had a PCN reaction causing immediate rash, facial/tongue/throat swelling, SOB or  lightheadedness with hypotension: Yes Has patient had a PCN reaction causing severe rash involving mucus membranes or skin necrosis: No Has patient had a PCN reaction that required hospitalization: No Has patient had a PCN reaction occurring within the last 10 years: No If all of the above answers are "NO", then may proceed with Cephalosporin use.     Review of Systems  Constitutional: Negative.   Respiratory: Negative.    Cardiovascular:  Positive for leg swelling. Negative for chest pain and palpitations.  Gastrointestinal: Negative.   Genitourinary: Negative.   Musculoskeletal:        Chronic pain  Skin:  Positive for rash (to bilateral legs with swelling).  Neurological: Negative.       Objective:    Physical Exam Vitals and nursing note reviewed.  Constitutional:      General: She is not in acute distress.    Appearance: Normal appearance.  HENT:     Head: Normocephalic.  Eyes:     Conjunctiva/sclera: Conjunctivae normal.  Cardiovascular:     Rate and Rhythm: Normal rate and regular rhythm.     Pulses: Normal pulses.     Heart sounds: Normal heart sounds.  Pulmonary:     Effort: Pulmonary effort is normal.     Breath sounds: Normal breath sounds.  Musculoskeletal:     Cervical back: Normal range of motion.     Right lower leg: Edema present.     Left lower leg: Edema present.     Comments: Bilateral swelling to lower extremities below knees, she would not let me press to see pitting status  Skin:    General: Skin is warm.     Findings: Rash (red, flat, lacy rash over lower extremities around edema) present.  Neurological:     General: No focal deficit present.     Mental Status: She is alert and oriented to person, place, and time.  Psychiatric:        Mood and Affect: Mood normal.        Behavior: Behavior normal.        Thought Content: Thought content normal.        Judgment: Judgment normal.    BP 106/68   Pulse 74   Temp 97.9 F (36.6 C) (Oral)    Wt 256 lb (116.1 kg)   SpO2 97%   BMI 43.94 kg/m  Wt Readings from Last 3 Encounters:  09/30/21 256 lb (116.1 kg)  09/11/21 246 lb 8  oz (111.8 kg)  09/03/21 248 lb (112.5 kg)    Health Maintenance Due  Topic Date Due   COVID-19 Vaccine (1) 11/05/1986   INFLUENZA VACCINE  07/07/2021    There are no preventive care reminders to display for this patient.   Lab Results  Component Value Date   TSH 1.920 09/03/2021   Lab Results  Component Value Date   WBC 8.9 09/03/2021   HGB 9.5 (L) 09/03/2021   HCT 33.1 (L) 09/03/2021   MCV 69 (L) 09/03/2021   PLT 295 09/03/2021   Lab Results  Component Value Date   NA 139 09/03/2021   K 4.2 09/03/2021   CO2 23 09/03/2021   GLUCOSE 73 09/03/2021   BUN 12 09/03/2021   CREATININE 0.68 09/03/2021   BILITOT <0.2 09/03/2021   ALKPHOS 83 09/03/2021   AST 16 09/03/2021   ALT 23 09/03/2021   PROT 6.1 09/03/2021   ALBUMIN 4.2 09/03/2021   CALCIUM 8.8 09/03/2021   ANIONGAP 8 11/22/2018   EGFR 116 09/03/2021   Lab Results  Component Value Date   CHOL 227 (H) 08/05/2021   Lab Results  Component Value Date   HDL 41 08/05/2021   Lab Results  Component Value Date   LDLCALC 147 (H) 08/05/2021   Lab Results  Component Value Date   TRIG 215 (H) 08/05/2021   Lab Results  Component Value Date   CHOLHDL 5.5 (H) 08/05/2021   Lab Results  Component Value Date   HGBA1C 5.9 (H) 06/26/2021       Assessment & Plan:   Problem List Items Addressed This Visit       Other   Leg edema - Primary    Acute x1 week after taking 1 day of gabapentin. Will treat with 7 days of lasix. Encouraged her to elevate her legs while sitting and to wear compression socks during the day. Will have her follow-up in 1 week and to check BMP at this visit. Gabapentin added to allergy list.         Meds ordered this encounter  Medications   furosemide (LASIX) 20 MG tablet    Sig: Take 1 tablet (20 mg total) by mouth daily.    Dispense:  7 tablet     Refill:  0      Charyl Dancer, NP

## 2021-10-04 ENCOUNTER — Other Ambulatory Visit: Payer: Self-pay | Admitting: Nurse Practitioner

## 2021-10-04 NOTE — Telephone Encounter (Signed)
last RF 09/30/21 #7 tablets only

## 2021-10-07 ENCOUNTER — Ambulatory Visit: Payer: Medicaid Other | Admitting: Nurse Practitioner

## 2021-10-07 NOTE — Progress Notes (Deleted)
   There were no vitals taken for this visit.   Subjective:    Patient ID: [name], female    DOB: 1986-02-09, 35 y.o.   MRN: [id number]  HPI: [name] is a 35 y.o. female  No chief complaint on file.   Relevant past medical, surgical, family and social history reviewed and updated as indicated. Interim medical history since our last visit reviewed. Allergies and medications reviewed and updated.  Review of Systems  Per HPI unless specifically indicated above     Objective:    There were no vitals taken for this visit.  Wt Readings from Last 3 Encounters:  09/30/21 256 lb (116.1 kg)  09/11/21 246 lb 8 oz (111.8 kg)  09/03/21 248 lb (112.5 kg)    Physical Exam  Results for orders placed or performed in visit on 09/03/21  Thyroid Panel With TSH  Result Value Ref Range   TSH 1.920 0.450 - 4.500 uIU/mL   T4, Total 7.7 4.5 - 12.0 ug/dL   T3 Uptake Ratio 24 24 - 39 %   Free Thyroxine Index 1.8 1.2 - 4.9  Comp Met (CMET)  Result Value Ref Range   Glucose 73 70 - 99 mg/dL   BUN 12 6 - 20 mg/dL   Creatinine, Ser 0.68 0.57 - 1.00 mg/dL   eGFR 116 >59 mL/min/1.73   BUN/Creatinine Ratio 18 9 - 23   Sodium 139 134 - 144 mmol/L   Potassium 4.2 3.5 - 5.2 mmol/L   Chloride 103 96 - 106 mmol/L   CO2 23 20 - 29 mmol/L   Calcium 8.8 8.7 - 10.2 mg/dL   Total Protein 6.1 6.0 - 8.5 g/dL   Albumin 4.2 3.8 - 4.8 g/dL   Globulin, Total 1.9 1.5 - 4.5 g/dL   Albumin/Globulin Ratio 2.2 1.2 - 2.2   Bilirubin Total <0.2 0.0 - 1.2 mg/dL   Alkaline Phosphatase 83 44 - 121 IU/L   AST 16 0 - 40 IU/L   ALT 23 0 - 32 IU/L  CBC w/Diff  Result Value Ref Range   WBC 8.9 3.4 - 10.8 x10E3/uL   RBC 4.81 3.77 - 5.28 x10E6/uL   Hemoglobin 9.5 (L) 11.1 - 15.9 g/dL   Hematocrit 33.1 (L) 34.0 - 46.6 %   MCV 69 (L) 79 - 97 fL   MCH 19.8 (L) 26.6 - 33.0 pg   MCHC 28.7 (L) 31.5 - 35.7 g/dL   RDW 17.5 (H) 11.7 - 15.4 %   Platelets 295 150 - 450 x10E3/uL   Neutrophils 55 Not Estab.  %   Lymphs 35 Not Estab. %   Monocytes 7 Not Estab. %   Eos 2 Not Estab. %   Basos 1 Not Estab. %   Neutrophils Absolute 4.9 1.4 - 7.0 x10E3/uL   Lymphocytes Absolute 3.1 0.7 - 3.1 x10E3/uL   Monocytes Absolute 0.6 0.1 - 0.9 x10E3/uL   EOS (ABSOLUTE) 0.2 0.0 - 0.4 x10E3/uL   Basophils Absolute 0.1 0.0 - 0.2 x10E3/uL   Immature Granulocytes 0 Not Estab. %   Immature Grans (Abs) 0.0 0.0 - 0.1 x10E3/uL      Assessment & Plan:   Problem List Items Addressed This Visit       Other   Leg edema - Primary     Follow up plan: No follow-ups on file.

## 2021-10-09 ENCOUNTER — Ambulatory Visit: Payer: Medicaid Other | Admitting: Cardiology

## 2021-10-22 ENCOUNTER — Telehealth: Payer: Self-pay

## 2021-10-22 NOTE — Telephone Encounter (Signed)
Patient returning call.

## 2021-10-22 NOTE — Telephone Encounter (Signed)
Called patient to give her the following result note from Dr. Herbie Baltimore. Left a VM requesting a call back.  Narrative & Impression    Patch Wear Time: 2 day & 12 hours (09/11/2021 - 09/13/2021)  Predominant rhythm is sinus with a heart rate range 60 to 106 bpm. Average heart rate 93 bpm.  Rare isolated PACs and PVCs noted. No couplets or triplets noted. Bigeminy or trigeminy noted.  No arrhythmias noted -> NO: Atrial fibrillation, atrial flutter, SVT  No bradycardia noted.  1 triggered event with sinus rhythm. -:> Noted chest pain or pressure.   ===============       Very normal monitor.  She is scheduled to be seen in follow-up clinic.  Can discuss in person     Bryan Lemma, MD

## 2021-10-22 NOTE — Telephone Encounter (Signed)
The patient has been notified of the result and verbalized understanding.  All questions (if any) were answered. Gibson Ramp, RN 10/22/2021 4:59 PM

## 2021-10-23 ENCOUNTER — Ambulatory Visit: Payer: Medicaid Other | Admitting: Cardiology

## 2021-11-04 ENCOUNTER — Ambulatory Visit
Admission: EM | Admit: 2021-11-04 | Discharge: 2021-11-04 | Disposition: A | Discharge status: Home / Self Care | Payer: Medicaid Other | Attending: Emergency Medicine | Admitting: Emergency Medicine

## 2021-11-04 ENCOUNTER — Other Ambulatory Visit: Payer: Self-pay

## 2021-11-04 ENCOUNTER — Other Ambulatory Visit: Payer: Self-pay | Admitting: Nurse Practitioner

## 2021-11-04 ENCOUNTER — Encounter: Payer: Self-pay | Admitting: Emergency Medicine

## 2021-11-04 DIAGNOSIS — J020 Streptococcal pharyngitis: Secondary | ICD-10-CM | POA: Diagnosis present

## 2021-11-04 LAB — GROUP A STREP BY PCR: Group A Strep by PCR: DETECTED — AB

## 2021-11-04 MED ORDER — LIDOCAINE VISCOUS HCL 2 % MT SOLN: 15.0000 mL | OROMUCOSAL | 0 refills | Status: DC | PRN

## 2021-11-04 MED ORDER — AZITHROMYCIN 250 MG PO TABS: 250.0000 mg | ORAL_TABLET | Freq: Every day | ORAL | 0 refills | Status: DC

## 2021-11-04 NOTE — ED Triage Notes (Signed)
Pt c/o sore throat, chills, body aches. Started this morning. She states her throat is swollen.

## 2021-11-04 NOTE — Discharge Instructions (Signed)
Take the Azithromycin  daily for 5 days for treatment of your strep throat.  Gargle with warm salt water 2-3 times a day to soothe your throat, aid in pain relief, and aid in healing.  Take over-the-counter ibuprofen according to the package instructions as needed for pain.  You can also use Chloraseptic or Sucrets lozenges, 1 lozenge every 2 hours as needed for throat pain.  Swish and spit 15 mL viscous lidocaine every 6 hours as needed for ST pain.  If you develop any new or worsening symptoms return for reevaluation.

## 2021-11-04 NOTE — ED Provider Notes (Signed)
[facility]    CSN: [id number] Arrival date & time: 11/04/21  0818      History   Chief Complaint Chief Complaint  Patient presents with   Sore Throat    HPI [name] is a 35 y.o. female.   HPI  35 year old female here for evaluation of sore throat.  Patient reports that she developed sore throat, chills, and body aches this morning and is also had some associated nausea.  Her mother was recently diagnosed with strep throat and she reports that she is sharing food and beverages with her mom.  Patient states that she has not taken her temperature so she does not know she had a fever.  She denies nasal congestion or runny nose, ear pain or pressure, cough, abdominal pain, vomiting, or diarrhea.  Past Medical History:  Diagnosis Date   Depression    Well-controlled with Zoloft   Gestational diabetes    Has prediabetes now.   Headache    Heroin abuse ([facility])    Migraines    Morbid obesity with BMI of 40.0-44.9, adult [facility])     Patient Active Problem List   Diagnosis Date Noted   Leg edema 09/30/2021   Dizzinesses 09/11/2021   Palpitations 09/11/2021   History of tobacco use 06/26/2021   Morbid obesity ([facility]) 06/26/2021   Delivery by elective cesarean section 06/15/2018   Postoperative state 06/15/2018   Labor and delivery indication for care or intervention 06/10/2018   Anemia, mild 03/28/2018   Abdominal pain affecting pregnancy 03/12/2018   History of drug use 12/14/2017   Symptomatic cholelithiasis    Acute cholecystitis 09/22/2016   Borderline personality disorder ([facility]) 04/16/2013   Cannabis abuse 04/16/2013    Past Surgical History:  Procedure Laterality Date   ANKLE SURGERY Left 2012   CESAREAN SECTION  2011   CESAREAN SECTION WITH BILATERAL TUBAL LIGATION Bilateral 06/15/2018   Procedure: CESAREAN SECTION WITH BILATERAL TUBAL LIGATION;  Surgeon: Suzy Bouchard, MD;  Location: [facility] ORS;  Service: Obstetrics;  Laterality:  Bilateral;   CHOLECYSTECTOMY N/A 09/27/2016   Procedure: LAPAROSCOPIC CHOLECYSTECTOMY WITH INTRAOPERATIVE CHOLANGIOGRAM;  Surgeon: Lattie Haw, MD;  Location: [facility] ORS;  Service: General;  Laterality: N/A;   DILATION AND CURETTAGE OF UTERUS  2013   TONSILLECTOMY  2007    OB History     Gravida  4   Para  2   Term  1   Preterm      AB  2   Living  1      SAB  1   IAB      Ectopic  1   Multiple  0   Live Births  1        Obstetric Comments  1st Menstrual Cycle:  12 1st Pregnancy:  21          Home Medications    Prior to Admission medications   Medication Sig Start Date End Date Taking? Authorizing Provider  azithromycin (ZITHROMAX Z-PAK) 250 MG tablet Take 1 tablet (250 mg total) by mouth daily. Take 2 tablets on the first day and then 1 tablet daily thereafter for a total of 5 days of treatment. 11/04/21  Yes Becky Augusta, NP  lidocaine (XYLOCAINE) 2 % solution Use as directed 15 mLs in the mouth or throat as needed for mouth pain. 11/04/21  Yes Becky Augusta, NP  QUEtiapine (SEROQUEL) 25 MG tablet Take 25 mg by mouth at bedtime. 10/07/21  Yes [provider]  sertraline (ZOLOFT) 100 MG tablet TK 2 TS PO HS 10/04/19  Yes [provider]  SUBOXONE 8-2 MG FILM Place under the tongue. 10/21/21  Yes [provider]  cloNIDine (CATAPRES) 0.1 MG tablet Take 0.1 mg by mouth at bedtime. 10/29/21   [provider]  furosemide (LASIX) 20 MG tablet Take 1 tablet (20 mg total) by mouth daily. 09/30/21   McElwee, Jake ChurchLauren A, NP    Family History Family History  Problem Relation Age of Onset   Arthritis Mother    Cancer Mother        skin   Hyperlipidemia Mother        takes meds   Coronary artery disease Mother        non-obstructive   Cancer Father        Lung   Anxiety disorder Sister    Depression Sister    Heart attack Maternal Grandfather 32   Coronary artery disease Maternal Grandfather [id number]       s/p CABG   ADD /  ADHD Son     Social History Social History   Tobacco Use   Smoking status: Every Day    Packs/day: 1.00    Years: 13.00    Pack years: 13.00    Types: Cigarettes   Smokeless tobacco: Never  Vaping Use   Vaping Use: Never used  Substance Use Topics   Alcohol use: No   Drug use: Not Currently     Allergies   Clindamycin/lincomycin, Gabapentin, and Amoxicillin   Review of Systems Review of Systems  Constitutional:  Positive for chills. Negative for activity change, appetite change and fever.  HENT:  Positive for sore throat. Negative for congestion, ear pain and rhinorrhea.   Respiratory:  Negative for cough.   Gastrointestinal:  Positive for nausea. Negative for diarrhea and vomiting.  Hematological: Negative.   Psychiatric/Behavioral: Negative.      Physical Exam Triage Vital Signs ED Triage Vitals  Enc Vitals Group     BP 11/04/21 0845 127/68     Pulse Rate 11/04/21 0845 75     Resp 11/04/21 0845 18     Temp 11/04/21 0845 98.4 F (36.9 C)     Temp Source 11/04/21 0845 Oral     SpO2 11/04/21 0845 98 %     Weight 11/04/21 0842 255 lb 15.3 oz (116.1 kg)     Height 11/04/21 0842 5\' 4"  (1.626 m)     Head Circumference --      Peak Flow --      Pain Score 11/04/21 0841 8     Pain Loc --      Pain Edu? --      Excl. in GC? --    No data found.  Updated Vital Signs BP 127/68 (BP Location: Right Arm)   Pulse 75   Temp 98.4 F (36.9 C) (Oral)   Resp 18   Ht 5\' 4"  (1.626 m)   Wt 255 lb 15.3 oz (116.1 kg)   LMP 10/04/2021 (Approximate)   SpO2 98%   Breastfeeding No   BMI 43.93 kg/m   Visual Acuity Right Eye Distance:   Left Eye Distance:   Bilateral Distance:    Right Eye Near:   Left Eye Near:    Bilateral Near:     Physical Exam Vitals and nursing note reviewed.  Constitutional:      General: She is not in acute distress.    Appearance: Normal appearance. She is obese. She  is not ill-appearing.  HENT:     Head: Normocephalic and atraumatic.      Right Ear: Tympanic membrane, ear canal and external ear normal. There is no impacted cerumen.     Left Ear: Tympanic membrane, ear canal and external ear normal. There is no impacted cerumen.     Nose: Nose normal.     Mouth/Throat:     Mouth: Mucous membranes are moist.     Pharynx: Oropharynx is clear. Posterior oropharyngeal erythema present. No oropharyngeal exudate.  Cardiovascular:     Rate and Rhythm: Normal rate and regular rhythm.     Pulses: Normal pulses.     Heart sounds: Normal heart sounds. No murmur heard.   No gallop.  Pulmonary:     Effort: Pulmonary effort is normal.     Breath sounds: Normal breath sounds. No wheezing, rhonchi or rales.  Musculoskeletal:     Cervical back: Normal range of motion and neck supple.  Lymphadenopathy:     Cervical: No cervical adenopathy.  Skin:    General: Skin is warm and dry.     Capillary Refill: Capillary refill takes less than 2 seconds.     Findings: No erythema or rash.  Neurological:     General: No focal deficit present.     Mental Status: She is alert and oriented to person, place, and time.  Psychiatric:        Mood and Affect: Mood normal.        Behavior: Behavior normal.        Thought Content: Thought content normal.        Judgment: Judgment normal.     UC Treatments / Results  Labs (all labs ordered are listed, but only abnormal results are displayed) Labs Reviewed  GROUP A STREP BY PCR - Abnormal; Notable for the following components:      Result Value   Group A Strep by PCR DETECTED (*)    All other components within normal limits    EKG   Radiology No results found.  Procedures Procedures (including critical care time)  Medications Ordered in UC Medications - No data to display  Initial Impression / Assessment and Plan / UC Course  I have reviewed the triage vital signs and the nursing notes.  Pertinent labs & imaging results that were available during my care of the patient were  reviewed by me and considered in my medical decision making (see chart for details).  Patient is a very pleasant, nontoxic-appearing 17 old female here for evaluation of sore throat without any other associated upper respiratory symptoms.  Patient does complain of nausea, body aches, and chills.  Her mother recently had strep throat which caused her to be hospitalized.  Patient reports that she ate and drink after her mother prior to her being diagnosed with strep.  On physical exam patient is pearly gray tympanic membranes bilaterally with normal light reflex and clear external auditory canals.  Nasal mucosa is unremarkable.  Oropharyngeal exam reveals surgically absent tonsillar pillars.  The posterior oropharynx is erythematous and mildly injected.  No exudate appreciated on exam.  No cervical lymphadenopathy appreciated exam.  Cardiopulmonary exam reveals clear lung sounds in all fields.  Strep PCR was collected at triage and is positive for strep A.  We will treat patient with azithromycin as she is allergic to amoxicillin and clindamycin.  I have also prescribed viscous lidocaine for the patient to help with her sore throat pain and have instructed her  to swish and spit 15 mL every 6 hours as needed for discomfort.  She can also perform salt water gargles, use over-the-counter ibuprofen, and over-the-counter Chloraseptic or Sucrets lozenges as needed for throat pain.   Final Clinical Impressions(s) / UC Diagnoses   Final diagnoses:  Strep pharyngitis     Discharge Instructions      Take the Azithromycin  daily for 5 days for treatment of your strep throat.  Gargle with warm salt water 2-3 times a day to soothe your throat, aid in pain relief, and aid in healing.  Take over-the-counter ibuprofen according to the package instructions as needed for pain.  You can also use Chloraseptic or Sucrets lozenges, 1 lozenge every 2 hours as needed for throat pain.  Swish and spit 15 mL viscous  lidocaine every 6 hours as needed for ST pain.  If you develop any new or worsening symptoms return for reevaluation.      ED Prescriptions     Medication Sig Dispense Auth. Provider   azithromycin (ZITHROMAX Z-PAK) 250 MG tablet Take 1 tablet (250 mg total) by mouth daily. Take 2 tablets on the first day and then 1 tablet daily thereafter for a total of 5 days of treatment. 6 tablet Margarette Canada, NP   lidocaine (XYLOCAINE) 2 % solution Use as directed 15 mLs in the mouth or throat as needed for mouth pain. 100 mL Margarette Canada, NP      PDMP not reviewed this encounter.   Margarette Canada, NP 11/04/21 [phone]

## 2021-11-05 NOTE — Telephone Encounter (Signed)
Requested medications are due for refill today.  unsure  Requested medications are on the active medications list.  yes  Last refill. 09/30/2021  Future visit scheduled.   no  Notes to clinic.  Pt was prescribed furosemide for leg swelling from a medication reaction. Please advise.

## 2021-12-03 ENCOUNTER — Encounter: Payer: Self-pay | Admitting: Nurse Practitioner

## 2021-12-03 ENCOUNTER — Other Ambulatory Visit: Payer: Self-pay

## 2021-12-03 ENCOUNTER — Ambulatory Visit: Payer: Self-pay | Admitting: *Deleted

## 2021-12-03 ENCOUNTER — Ambulatory Visit (INDEPENDENT_AMBULATORY_CARE_PROVIDER_SITE_OTHER): Payer: Medicaid Other | Admitting: Nurse Practitioner

## 2021-12-03 VITALS — BP 138/84 | HR 92 | Temp 100.2°F | Ht 64.0 in | Wt 240.2 lb

## 2021-12-03 DIAGNOSIS — R051 Acute cough: Secondary | ICD-10-CM | POA: Insufficient documentation

## 2021-12-03 LAB — VERITOR FLU A/B WAIVED
Influenza A: NEGATIVE
Influenza B: NEGATIVE

## 2021-12-03 MED ORDER — LIDOCAINE VISCOUS HCL 2 % MT SOLN: 15.0000 mL | OROMUCOSAL | 0 refills | Status: DC | PRN

## 2021-12-03 MED ORDER — BENZONATATE 100 MG PO CAPS: 100.0000 mg | ORAL_CAPSULE | Freq: Three times a day (TID) | ORAL | 0 refills | Status: DC | PRN

## 2021-12-03 MED ORDER — PREDNISONE 20 MG PO TABS: 40.0000 mg | ORAL_TABLET | Freq: Every day | ORAL | 0 refills | Status: AC

## 2021-12-03 NOTE — Telephone Encounter (Signed)
Reason for Disposition  SEVERE (e.g., excruciating) throat pain  Answer Assessment - Initial Assessment Questions 1. ONSET: "When did the throat start hurting?" (Hours or days ago)      Pt calling in c/o a sore throat started 2 days ago.    I'm having drainage in the back of my throat. 2. SEVERITY: "How bad is the sore throat?" (Scale 1-10; mild, moderate or severe)   - MILD (1-3):  doesn't interfere with eating or normal activities   - MODERATE (4-7): interferes with eating some solids and normal activities   - SEVERE (8-10):  excruciating pain, interferes with most normal activities   - SEVERE DYSPHAGIA: can't swallow liquids, drooling     *No Answer* 3. STREP EXPOSURE: "Has there been any exposure to strep within the past week?" If Yes, ask: "What type of contact occurred?"      I had strep throat 2-3 wks ago and took an antibiotic and now I've got it again.    4.  VIRAL SYMPTOMS: "Are there any symptoms of a cold, such as a runny nose, cough, hoarse voice or red eyes?"      My chest is full, congested, not coughing up much yellow/red from dryness.   No runny nose.    My nose is stopped up. 5. FEVER: "Do you have a fever?" If Yes, ask: "What is your temperature, how was it measured, and when did it start?"     I did but not now 6. PUS ON THE TONSILS: "Is there pus on the tonsils in the back of your throat?"     No pus 7. OTHER SYMPTOMS: "Do you have any other symptoms?" (e.g., difficulty breathing, headache, rash)     I'm having shortness of breath and wheezing  8. PREGNANCY: "Is there any chance you are pregnant?" "When was your last menstrual period?"     Not asked  Protocols used: Sore Throat-A-AH

## 2021-12-03 NOTE — Assessment & Plan Note (Signed)
Acute x 3 days.  Flu testing negative, will obtain Covid testing here in office -- at home was negative 2 days ago.  Recommend self quarantine until results returned.  At this time will treat with Prednisone 40 MG x 5 days, Tessalon, and viscous Lidocaine as needed.  Will obtain CXR due to patient concern for PNA, although low suspicion for this based on exam.  Is on Suboxone, avoid Tussionex.  Recommend: - Increased rest - Increasing Fluids - Acetaminophen as needed for fever/pain.  - Salt water gargling, chloraseptic spray and throat lozenges - Mucinex.  - Saline sinus flushes or a neti pot.  - Humidifying the air. Return to office for worsening or ongoing symptoms.

## 2021-12-03 NOTE — Telephone Encounter (Signed)
°  Chief Complaint: bad sore swollen throat.  Covid negative yesterday Symptoms: chest congestion, shortness of breath, coughing, very bad sore throat started 2 days ago Frequency: constantly Pertinent Negatives: Patient denies fever Disposition: [] ED /[] Urgent Care (no appt availability in office) / [x] Appointment(In office/virtual)/ []  [facility]/ [] Home Care/ [] Refused Recommended Disposition  Additional Notes: I made her an appt for today at 4:00 at [facility].   She had strep throat 2-3 wks ago and was treated but she feels like it has returned.

## 2021-12-03 NOTE — Patient Instructions (Signed)

## 2021-12-03 NOTE — Progress Notes (Signed)
BP 138/84    Pulse 92    Temp 100.2 F (37.9 C)    Ht 5\' 4"  (1.626 m)    Wt 240 lb 3.2 oz (109 kg)    SpO2 94%    BMI 41.23 kg/m    Subjective:    Patient ID: [name], female    DOB: 1986-06-16, 35 y.o.   MRN: [id number]  HPI: [name] is a 35 y.o. female  Chief Complaint  Patient presents with   Sore Throat   Pneumonia    Patient states she think she may have pneumonia because she had Strep Throat about 2 weeks ago. Patient states she was given Z-Pak during that and states it did not help. Patient states she is having sore throat from drainage running down her throat. Patient states it is just top of her throat. Patient states she had a fever of 101.3 today.    Fever   Generalized Body Aches   UPPER RESPIRATORY TRACT INFECTION Seen in UC on 11/04/21, was positive for Strep A at time and treated with Zpack as allergic to Amoxicillin and Clindamycin.  She is concerned at this time for pneumonia due to strep throat.  She felt better after strep throat treatment and then current symptoms started 3 days -- started with head congestion.    Covid test at home 2 days ago was negative.  Has had 2 Covid vaccinations and flu vaccine.  Is a smoker -- smokes 1/2 PPD.   Fever: yes Cough: yes Shortness of breath:  occasional Wheezing: yes Chest pain: no Chest tightness: yes Chest congestion: yes Nasal congestion: yes Runny nose: no Post nasal drip: no Sneezing: no Sore throat: yes Swollen glands: no Sinus pressure: yes Headache: yes Face pain: no Toothache: no Ear pain: yes bilateral Ear pressure: yes bilateral Eyes red/itching:no Eye drainage/crusting: no  Vomiting: no Rash: no Fatigue: yes Sick contacts: yes -- going back and forth in house Strep contacts: no  Context: fluctuating Recurrent sinusitis: no Relief with OTC cold/cough medications: yes  Treatments attempted: Dayquil  Relevant past medical, surgical, family and social history reviewed and  updated as indicated. Interim medical history since our last visit reviewed. Allergies and medications reviewed and updated.  Review of Systems  Constitutional:  Positive for appetite change, fatigue and fever. Negative for activity change and diaphoresis.  HENT:  Positive for congestion, sinus pressure, sinus pain and sore throat. Negative for ear discharge, ear pain, facial swelling, postnasal drip, rhinorrhea, sneezing and voice change.   Eyes:  Negative for pain and visual disturbance.  Respiratory:  Positive for cough, chest tightness and wheezing. Negative for shortness of breath.   Cardiovascular:  Negative for chest pain, palpitations and leg swelling.  Gastrointestinal: Negative.   Endocrine: Negative.   Musculoskeletal:  Positive for myalgias.  Neurological:  Positive for headaches. Negative for dizziness and numbness.  Psychiatric/Behavioral: Negative.     Per HPI unless specifically indicated above     Objective:    BP 138/84    Pulse 92    Temp 100.2 F (37.9 C)    Ht 5\' 4"  (1.626 m)    Wt 240 lb 3.2 oz (109 kg)    SpO2 94%    BMI 41.23 kg/m   Wt Readings from Last 3 Encounters:  12/03/21 240 lb 3.2 oz (109 kg)  11/04/21 255 lb 15.3 oz (116.1 kg)  09/30/21 256 lb (116.1 kg)    Physical Exam Vitals and nursing note reviewed.  Constitutional:      General: She is awake. She is not in acute distress.    Appearance: She is well-developed and well-groomed. She is obese. She is ill-appearing. She is not toxic-appearing.  HENT:     Head: Normocephalic.     Right Ear: Hearing, ear canal and external ear normal. A middle ear effusion is present. Tympanic membrane is not injected.     Left Ear: Hearing, ear canal and external ear normal. A middle ear effusion is present. Tympanic membrane is not injected.     Nose: Rhinorrhea present. Rhinorrhea is clear.     Right Turbinates: Enlarged.     Left Turbinates: Enlarged.     Right Sinus: No maxillary sinus tenderness or frontal  sinus tenderness.     Left Sinus: No maxillary sinus tenderness or frontal sinus tenderness.     Mouth/Throat:     Mouth: Mucous membranes are moist.     Pharynx: Oropharynx is clear. Posterior oropharyngeal erythema (mild with cobblestoning) present. No pharyngeal swelling or oropharyngeal exudate.     Comments: No tonsils. Eyes:     General: Lids are normal.        Right eye: No discharge.        Left eye: No discharge.     Conjunctiva/sclera: Conjunctivae normal.     Pupils: Pupils are equal, round, and reactive to light.  Neck:     Thyroid: No thyromegaly.     Vascular: No carotid bruit or JVD.  Cardiovascular:     Rate and Rhythm: Normal rate and regular rhythm.     Heart sounds: Normal heart sounds. No murmur heard.   No gallop.  Pulmonary:     Effort: Pulmonary effort is normal. No accessory muscle usage or respiratory distress.     Breath sounds: Wheezing present. No decreased breath sounds or rhonchi.     Comments: Scattered wheezes throughout lung fields on expiration. Abdominal:     General: Bowel sounds are normal.     Palpations: Abdomen is soft. There is no hepatomegaly or splenomegaly.  Musculoskeletal:     Cervical back: Normal range of motion and neck supple.     Right lower leg: No edema.     Left lower leg: No edema.  Lymphadenopathy:     Cervical: No cervical adenopathy.  Skin:    General: Skin is warm and dry.  Neurological:     Mental Status: She is alert and oriented to person, place, and time.  Psychiatric:        Attention and Perception: Attention normal.        Mood and Affect: Mood normal.        Speech: Speech normal.        Behavior: Behavior normal. Behavior is cooperative.        Thought Content: Thought content normal.    Results for orders placed or performed in visit on 12/03/21  Veritor Flu A/B Waived  Result Value Ref Range   Influenza A Negative Negative   Influenza B Negative Negative      Assessment & Plan:   Problem List  Items Addressed This Visit       Other   Acute cough - Primary    Acute x 3 days.  Flu testing negative, will obtain Covid testing here in office -- at home was negative 2 days ago.  Recommend self quarantine until results returned.  At this time will treat with Prednisone 40 MG x 5 days, Tessalon, and  viscous Lidocaine as needed.  Will obtain CXR due to patient concern for PNA, although low suspicion for this based on exam.  Is on Suboxone, avoid Tussionex.  Recommend: - Increased rest - Increasing Fluids - Acetaminophen as needed for fever/pain.  - Salt water gargling, chloraseptic spray and throat lozenges - Mucinex.  - Saline sinus flushes or a neti pot.  - Humidifying the air. Return to office for worsening or ongoing symptoms.      Relevant Orders   Veritor Flu A/B Waived (Completed)   Novel Coronavirus, NAA ([facility])   DG Chest 2 View     Follow up plan: Return if symptoms worsen or fail to improve.

## 2021-12-04 LAB — NOVEL CORONAVIRUS, NAA: SARS-CoV-2, NAA: NOT DETECTED

## 2021-12-04 LAB — SARS-COV-2, NAA 2 DAY TAT

## 2021-12-04 NOTE — Progress Notes (Signed)
Contacted via MyChart   Covid testing has returned negative, if any worsening or ongoing symptoms let me know.

## 2021-12-11 ENCOUNTER — Encounter: Payer: Self-pay | Admitting: Nurse Practitioner

## 2021-12-11 MED ORDER — DOXYCYCLINE HYCLATE 100 MG PO TABS: 100.0000 mg | ORAL_TABLET | Freq: Two times a day (BID) | ORAL | 0 refills | Status: AC

## 2021-12-30 ENCOUNTER — Other Ambulatory Visit: Payer: Self-pay

## 2021-12-30 ENCOUNTER — Encounter (HOSPITAL_COMMUNITY): Payer: Self-pay | Admitting: Emergency Medicine

## 2021-12-30 ENCOUNTER — Emergency Department (HOSPITAL_COMMUNITY)
Admission: EM | Admit: 2021-12-30 | Discharge: 2021-12-30 | Disposition: A | Discharge status: Home / Self Care | Payer: Medicaid Other | Attending: Emergency Medicine | Admitting: Emergency Medicine

## 2021-12-30 ENCOUNTER — Emergency Department (HOSPITAL_COMMUNITY): Payer: Medicaid Other

## 2021-12-30 DIAGNOSIS — M1711 Unilateral primary osteoarthritis, right knee: Secondary | ICD-10-CM | POA: Diagnosis not present

## 2021-12-30 DIAGNOSIS — M25561 Pain in right knee: Secondary | ICD-10-CM | POA: Diagnosis present

## 2021-12-30 LAB — POC URINE PREG, ED: Preg Test, Ur: NEGATIVE

## 2021-12-30 MED ORDER — PREDNISONE 10 MG PO TABS: ORAL_TABLET | ORAL | 0 refills | Status: DC

## 2021-12-30 MED ORDER — DEXAMETHASONE SODIUM PHOSPHATE 10 MG/ML IJ SOLN
10.0000 mg | Freq: Once | INTRAMUSCULAR | Status: AC
  Administered 2021-12-30: 12:00:00 10 mg via INTRAMUSCULAR
  Filled 2021-12-30: qty 1

## 2021-12-30 NOTE — ED Notes (Signed)
ED Provider at bedside. 

## 2021-12-30 NOTE — Discharge Instructions (Signed)
Your knee x-ray appears stable today, you do have mild arthritis in this knee without an appreciable effusion (fluid collection).  You are being prescribed a prednisone taper which may help with your pain.  Also advised gentle heat therapy as tolerated, avoiding any activity that worsens your pain but staying as active as you comfortably can.  You have also been provided a knee sleeve which may help support your knee.  I have referred you to our local orthopedist for follow-up care if your knee if it continues to be problematic for you.

## 2021-12-30 NOTE — ED Triage Notes (Signed)
Pt to the ED with right knee pain for the past year.  Pt had a cortisone shot in May of 2022 which is the last time she saw the orthopedic.

## 2021-12-30 NOTE — Progress Notes (Signed)
BP 139/76    Pulse 75    Temp 98.2 F (36.8 C) (Oral)    Wt 255 lb (115.7 kg)    LMP  (LMP Unknown)    SpO2 94%    BMI 43.77 kg/m    Subjective:    Patient ID: [name], female    DOB: 05-30-1986, 36 y.o.   MRN: [id number]  HPI: [name] is a 36 y.o. female  Chief Complaint  Patient presents with   Depression    Pt states she is still taking the Sertraline but it is not helping at all.    Anxiety   DEPRESSION/ANXIETY Patient states her depression and anxiety are not well controlled.  She is currently taking Zoloft 100mg  and Serquoel 25mg  daily.  Patient hasn't had a period in 3 months.  Pregnancy test at [facility] pen was negative.  She is not sexually active.  Patient states she wants to sleep all day but then she isn't able to sleep at night.   GAD 7 : Generalized Anxiety Score 12/31/2021  Nervous, Anxious, on Edge 3  Control/stop worrying 3  Worry too much - different things 3  Trouble relaxing 3  Restless 3  Easily annoyed or irritable 3  Afraid - awful might happen 3  Total GAD 7 Score 21  Anxiety Difficulty Extremely difficult    Flowsheet Row Office Visit from 12/31/2021 in [facility]  PHQ-9 Total Score 21       Relevant past medical, surgical, family and social history reviewed and updated as indicated. Interim medical history since our last visit reviewed. Allergies and medications reviewed and updated.  Review of Systems  Musculoskeletal:  Positive for back pain.  Psychiatric/Behavioral:  Positive for dysphoric mood. The patient is nervous/anxious.    Per HPI unless specifically indicated above     Objective:    BP 139/76    Pulse 75    Temp 98.2 F (36.8 C) (Oral)    Wt 255 lb (115.7 kg)    LMP  (LMP Unknown)    SpO2 94%    BMI 43.77 kg/m   Wt Readings from Last 3 Encounters:  12/31/21 255 lb (115.7 kg)  12/30/21 240 lb 4.8 oz (109 kg)  12/03/21 240 lb 3.2 oz (109 kg)    Physical Exam Vitals and nursing note reviewed.   Constitutional:      General: She is not in acute distress.    Appearance: Normal appearance. She is normal weight. She is not ill-appearing, toxic-appearing or diaphoretic.  HENT:     Head: Normocephalic.     Right Ear: External ear normal.     Left Ear: External ear normal.     Nose: Nose normal.     Mouth/Throat:     Mouth: Mucous membranes are moist.     Pharynx: Oropharynx is clear.  Eyes:     General:        Right eye: No discharge.        Left eye: No discharge.     Extraocular Movements: Extraocular movements intact.     Conjunctiva/sclera: Conjunctivae normal.     Pupils: Pupils are equal, round, and reactive to light.  Cardiovascular:     Rate and Rhythm: Normal rate and regular rhythm.     Heart sounds: No murmur heard. Pulmonary:     Effort: Pulmonary effort is normal. No respiratory distress.     Breath sounds: Normal breath sounds. No wheezing or rales.  Musculoskeletal:  Cervical back: Normal range of motion and neck supple.  Skin:    General: Skin is warm and dry.     Capillary Refill: Capillary refill takes less than 2 seconds.  Neurological:     General: No focal deficit present.     Mental Status: She is alert and oriented to person, place, and time. Mental status is at baseline.  Psychiatric:        Mood and Affect: Mood normal.        Behavior: Behavior normal.        Thought Content: Thought content normal.        Judgment: Judgment normal.    Results for orders placed or performed during the hospital encounter of 12/30/21  POC Urine Pregnancy, ED (not at [facility])  Result Value Ref Range   Preg Test, Ur NEGATIVE NEGATIVE      Assessment & Plan:   Problem List Items Addressed This Visit       Other   Borderline personality disorder (HCC) - Primary    Chronic. Not well controlled.  Previously has been getting her medications from her Suboxone clinic.  States they are not working. Recently did the gene testing for depression medications but has  not received the results. Will increase Seroquel to 100mg  daily and change from Zoloft 100mg  to Effexor 37.5mg .  Side effects and benefits of medication discussed with patient during visit.  Follow up in 2 weeks for reevaluation.      Depression    Chronic. Not well controlled.  Previously has been getting her medications from her Suboxone clinic.  States they are not working. Recently did the gene testing for depression medications but has not received the results. Will increase Seroquel to 100mg  daily and change from Zoloft 100mg  to Effexor 37.5mg .  Side effects and benefits of medication discussed with patient during visit.  Follow up in 2 weeks for reevaluation.      Relevant Medications   venlafaxine XR (EFFEXOR XR) 37.5 MG 24 hr capsule   Other Visit Diagnoses     Chronic midline low back pain with left-sided sciatica       Would like referral to Chiropractor. Placed at patient's request.   Relevant Medications   baclofen (LIORESAL) 10 MG tablet   QUEtiapine (SEROQUEL) 100 MG tablet   venlafaxine XR (EFFEXOR XR) 37.5 MG 24 hr capsule   Other Relevant Orders   Ambulatory referral to Chiropractic   Amenorrhea       Patient would like to see GYN.  Referral placed.   Relevant Orders   Ambulatory referral to Gynecology        Follow up plan: Return in about 2 months (around 02/28/2022) for Depression/Anxiety FU.

## 2021-12-30 NOTE — ED Notes (Signed)
Pt stated she had to leave right away to pick up her sick son from daycare.  Unable to obtain d/c vital signs due to pt needing to leave.

## 2021-12-30 NOTE — ED Provider Notes (Signed)
[facility] EMERGENCY DEPARTMENT Provider Note   CSN: [id number] Arrival date & time: 12/30/21  1016     History  Chief Complaint  Patient presents with   Knee Pain    [name] is a 36 y.o. female presenting for evaluation of acute on chronic right knee pain.  She has known osteoarthritis in the right knee, was formally under the care of an orthopedist in [place], stating she had a knee joint steroid injection last May which relieved her pain for about 10 days.  She has had escalating pain in the knee or the past several weeks, she denies any new injuries but has noted some swelling around the knee itself.  She is under the care of pain management, receives Suboxone daily.  Her pain is worsened with movement and weightbearing.  Better but not resolved at rest.  She has found no alleviators for her symptoms.  States she would like to see a new orthopedist as her prior provider was not adequately treating her pain.  States she has used Voltaren gel in the past but not recently.  The history is provided by the patient.      Home Medications Prior to Admission medications   Medication Sig Start Date End Date Taking? Authorizing Provider  predniSONE (DELTASONE) 10 MG tablet 6, 5, 4, 3, 2 then 1 tablet by mouth daily for 6 days total. 12/30/21  Yes Leida Luton, Raynelle Fanning, PA-C  azithromycin (ZITHROMAX Z-PAK) 250 MG tablet Take 1 tablet (250 mg total) by mouth daily. Take 2 tablets on the first day and then 1 tablet daily thereafter for a total of 5 days of treatment. Patient not taking: Reported on 12/03/2021 11/04/21   Becky Augusta, NP  benzonatate (TESSALON PERLES) 100 MG capsule Take 1 capsule (100 mg total) by mouth 3 (three) times daily as needed for cough. 12/03/21   Cannady, Corrie Dandy T, NP  cloNIDine (CATAPRES) 0.1 MG tablet Take 0.1 mg by mouth at bedtime. 10/29/21   [provider]  furosemide (LASIX) 20 MG tablet Take 1 tablet (20 mg total) by mouth daily. 09/30/21   McElwee,  Lauren A, NP  lidocaine (XYLOCAINE) 2 % solution Use as directed 15 mLs in the mouth or throat as needed for mouth pain. 12/03/21   Cannady, Corrie Dandy T, NP  QUEtiapine (SEROQUEL) 25 MG tablet Take 25 mg by mouth at bedtime. 10/07/21   [provider]  sertraline (ZOLOFT) 100 MG tablet TK 2 TS PO HS 10/04/19   [provider]  SUBOXONE 8-2 MG FILM Place under the tongue. 10/21/21   [provider]      Allergies    Clindamycin/lincomycin, Gabapentin, and Amoxicillin    Review of Systems   Review of Systems  Constitutional:  Negative for chills and fever.  Musculoskeletal:  Positive for arthralgias and joint swelling. Negative for myalgias.  Neurological:  Negative for weakness and numbness.  All other systems reviewed and are negative.  Physical Exam Updated Vital Signs BP 136/69 (BP Location: Right Arm)    Pulse 78    Temp 98 F (36.7 C) (Oral)    Resp 16    Ht 5\' 4"  (1.626 m)    Wt 109 kg    LMP 11/03/2021 (Approximate)    SpO2 97%    BMI 41.25 kg/m  Physical Exam Constitutional:      Appearance: She is well-developed.  HENT:     Head: Atraumatic.  Cardiovascular:     Pulses:  Dorsalis pedis pulses are 2+ on the right side and 2+ on the left side.     Comments: Pulses equal bilaterally Musculoskeletal:        General: Tenderness present.     Cervical back: Normal range of motion.     Right knee: Bony tenderness present. No effusion or erythema. Decreased range of motion. No LCL laxity, MCL laxity, ACL laxity or PCL laxity. Normal patellar mobility. Normal pulse.     Comments: TTP medial and lateral right knee joint space.  No palpable deformity.  Skin:    General: Skin is warm and dry.     Comments: Bilateral lower extremity edema, equal, no calf tenderness.  Well healed surgical incision left medial ankle.   Neurological:     Mental Status: She is alert.     Sensory: No sensory deficit.     Motor: No weakness.     Deep Tendon Reflexes:  Reflexes normal.    ED Results / Procedures / Treatments   Labs (all labs ordered are listed, but only abnormal results are displayed) Labs Reviewed  POC URINE PREG, ED    EKG None  Radiology DG Knee Complete 4 Views Right  Result Date: 12/30/2021 CLINICAL DATA:  Right knee pain for 1 year, pain with movement EXAM: RIGHT KNEE - COMPLETE 4+ VIEW COMPARISON:  Knee radiograph 07/08/2017 FINDINGS: There is no acute fracture or dislocation. Knee alignment is normal. There are mild degenerative changes in the medial compartment, not significantly changed. The other joint spaces appear preserved. There is inferior patellar enthesopathy. The soft tissues are unremarkable. There is no effusion. IMPRESSION: Mild degenerative changes in the medial compartment and inferior patellar enthesopathy, not significantly changed since 2018. Otherwise, normal knee radiographs with no acute finding. Electronically Signed   By: Lesia HausenPeter  Noone M.D.   On: 12/30/2021 12:07    Procedures Procedures    Medications Ordered in ED Medications  dexamethasone (DECADRON) injection 10 mg (10 mg Intramuscular Given 12/30/21 1203)    ED Course/ Medical Decision Making/ A&P                           Medical Decision Making Patient with acute on chronic right knee pain with known arthritis in the knee joint space.  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  She expressed displeasure with her prior orthopedist whom she states accused her of drug-seeking behavior.  She is on Suboxone under the care of of a pain management specialist.  Her knee exam is consistent with her known history of arthritis, there is no suggestion of septic joint, offered her prescription of prednisone taper, she states her pain specialist prescribed her that yesterday which she is to start taking.  We also discussed activity as tolerated, heat therapy.  Suggested Voltaren gel which she has  but is not currently using, advised that she may want to try this once again.  Also suggested a knee sleeve which she states she has but it does not help.  She was given referral to our local orthopedist Dr. Romeo AppleHarrison for follow-up care for her symptoms persist or worsen.  Amount and/or Complexity of Data Reviewed Radiology: ordered and independent interpretation performed.    Details: Mild arthritis, no significant joint effusion.  Risk Prescription drug management. Risk Details: Patient was prescribed prednisone, after this prescription was given, she mentioned that her pain specialist prescribed this to  her yesterday.  She was advised to start taking the pain specialist prescription which she already has but has not started.  Deferred today's prescription.           Final Clinical Impression(s) / ED Diagnoses Final diagnoses:  Osteoarthritis of right knee, unspecified osteoarthritis type    Rx / DC Orders ED Discharge Orders          Ordered    predniSONE (DELTASONE) 10 MG tablet        12/30/21 1212              Burgess Amor, PA-C 12/30/21 1230    Pricilla Loveless, MD 12/30/21 2146

## 2021-12-31 ENCOUNTER — Encounter: Payer: Self-pay | Admitting: Nurse Practitioner

## 2021-12-31 ENCOUNTER — Ambulatory Visit (INDEPENDENT_AMBULATORY_CARE_PROVIDER_SITE_OTHER): Payer: Medicaid Other | Admitting: Nurse Practitioner

## 2021-12-31 VITALS — BP 139/76 | HR 75 | Temp 98.2°F | Wt 255.0 lb

## 2021-12-31 DIAGNOSIS — F32A Depression, unspecified: Secondary | ICD-10-CM | POA: Diagnosis not present

## 2021-12-31 DIAGNOSIS — F603 Borderline personality disorder: Secondary | ICD-10-CM

## 2021-12-31 DIAGNOSIS — N912 Amenorrhea, unspecified: Secondary | ICD-10-CM | POA: Diagnosis not present

## 2021-12-31 DIAGNOSIS — M5442 Lumbago with sciatica, left side: Secondary | ICD-10-CM | POA: Diagnosis not present

## 2021-12-31 DIAGNOSIS — G8929 Other chronic pain: Secondary | ICD-10-CM

## 2021-12-31 MED ORDER — QUETIAPINE FUMARATE 100 MG PO TABS: 100.0000 mg | ORAL_TABLET | Freq: Every day | ORAL | 0 refills | Status: DC

## 2021-12-31 MED ORDER — VENLAFAXINE HCL ER 37.5 MG PO CP24: 37.5000 mg | ORAL_CAPSULE | Freq: Every day | ORAL | 0 refills | Status: DC

## 2021-12-31 NOTE — Assessment & Plan Note (Signed)
Chronic. Not well controlled.  Previously has been getting her medications from her Suboxone clinic.  States they are not working. Recently did the gene testing for depression medications but has not received the results. Will increase Seroquel to 100mg daily and change from Zoloft 100mg to Effexor 37.5mg.  Side effects and benefits of medication discussed with patient during visit.  Follow up in 2 weeks for reevaluation. °

## 2021-12-31 NOTE — Assessment & Plan Note (Signed)
Chronic. Not well controlled.  Previously has been getting her medications from her Suboxone clinic.  States they are not working. Recently did the gene testing for depression medications but has not received the results. Will increase Seroquel to 100mg  daily and change from Zoloft 100mg  to Effexor 37.5mg .  Side effects and benefits of medication discussed with patient during visit.  Follow up in 2 weeks for reevaluation.

## 2022-01-14 ENCOUNTER — Ambulatory Visit: Payer: Medicaid Other | Admitting: Nurse Practitioner

## 2022-01-19 ENCOUNTER — Ambulatory Visit (INDEPENDENT_AMBULATORY_CARE_PROVIDER_SITE_OTHER): Payer: Medicaid Other | Admitting: Obstetrics and Gynecology

## 2022-01-19 ENCOUNTER — Ambulatory Visit (INDEPENDENT_AMBULATORY_CARE_PROVIDER_SITE_OTHER): Payer: Medicaid Other | Admitting: Nurse Practitioner

## 2022-01-19 ENCOUNTER — Encounter: Payer: Self-pay | Admitting: Obstetrics and Gynecology

## 2022-01-19 ENCOUNTER — Encounter: Payer: Self-pay | Admitting: Nurse Practitioner

## 2022-01-19 ENCOUNTER — Other Ambulatory Visit: Payer: Self-pay

## 2022-01-19 VITALS — BP 138/71 | HR 77 | Temp 98.6°F | Wt 250.6 lb

## 2022-01-19 VITALS — BP 132/70 | Ht 64.0 in | Wt 250.0 lb

## 2022-01-19 DIAGNOSIS — J069 Acute upper respiratory infection, unspecified: Secondary | ICD-10-CM

## 2022-01-19 DIAGNOSIS — F603 Borderline personality disorder: Secondary | ICD-10-CM

## 2022-01-19 DIAGNOSIS — D500 Iron deficiency anemia secondary to blood loss (chronic): Secondary | ICD-10-CM | POA: Diagnosis not present

## 2022-01-19 DIAGNOSIS — N912 Amenorrhea, unspecified: Secondary | ICD-10-CM

## 2022-01-19 DIAGNOSIS — F32A Depression, unspecified: Secondary | ICD-10-CM | POA: Diagnosis not present

## 2022-01-19 MED ORDER — MEDROXYPROGESTERONE ACETATE 10 MG PO TABS: 10.0000 mg | ORAL_TABLET | Freq: Every day | ORAL | 0 refills | Status: DC

## 2022-01-19 MED ORDER — PREDNISONE 10 MG PO TABS: 10.0000 mg | ORAL_TABLET | Freq: Every day | ORAL | 0 refills | Status: DC

## 2022-01-19 MED ORDER — CEFDINIR 300 MG PO CAPS: 300.0000 mg | ORAL_CAPSULE | Freq: Two times a day (BID) | ORAL | 0 refills | Status: DC

## 2022-01-19 NOTE — Progress Notes (Signed)
BP 138/71    Pulse 77    Temp 98.6 F (37 C) (Oral)    Wt 250 lb 9.6 oz (113.7 kg)    LMP 09/16/2021 (Approximate)    SpO2 96%    BMI 43.02 kg/m    Subjective:    Patient ID: [name], female    DOB: 11/02/1986, 36 y.o.   MRN: [id number]  HPI: DELAINE HERNANDEZ is a 36 y.o. female  Chief Complaint  Patient presents with   Depression   Anxiety   URI    Pt states she has had ongoing sinus issues since before Christmas. States she has been having green phlegm recently.    DEPRESSION/ANXIETY Patient states her depression and anxiety were better. She is not taking Seroquel 100mg  and Effexor 37.5mg .  She feels like these are good doses for her.  She saw the OBGYN today who is working to help her get her period under control.   GAD 7 : Generalized Anxiety Score 01/19/2022 12/31/2021  Nervous, Anxious, on Edge 0 3  Control/stop worrying 3 3  Worry too much - different things 3 3  Trouble relaxing 3 3  Restless 1 3  Easily annoyed or irritable 3 3  Afraid - awful might happen 0 3  Total GAD 7 Score 13 21  Anxiety Difficulty Not difficult at all Extremely difficult    Flowsheet Row Office Visit from 01/19/2022 in [facility]  PHQ-9 Total Score 5      UPPER RESPIRATORY TRACT INFECTION Worst symptom: has been going on for about a month Fever: no Cough: yes Shortness of breath: yes Wheezing: yes Chest pain: no Chest tightness: no Chest congestion: yes Nasal congestion: yes Runny nose: no Post nasal drip: no Sneezing: no Sore throat: no Swollen glands: yes Sinus pressure: yes Headache: yes Face pain: yes Toothache: no Ear pain: no bilateral Ear pressure: yes "right Eyes red/itching:no Eye drainage/crusting: no  Vomiting: no Rash: no Fatigue: yes Sick contacts: no Strep contacts: no  Context: stable Recurrent sinusitis: no Relief with OTC cold/cough medications: no  Treatments attempted: cold/sinus and cough syrup    Relevant past medical,  surgical, family and social history reviewed and updated as indicated. Interim medical history since our last visit reviewed. Allergies and medications reviewed and updated.  Review of Systems  Constitutional:  Positive for fatigue. Negative for fever.  HENT:  Positive for congestion and sinus pressure. Negative for dental problem, ear pain, postnasal drip, rhinorrhea, sinus pain, sneezing and sore throat.   Respiratory:  Positive for cough, shortness of breath and wheezing.   Cardiovascular:  Positive for chest pain.  Gastrointestinal:  Negative for vomiting.  Skin:  Negative for rash.  Neurological:  Positive for headaches.  Psychiatric/Behavioral:  Positive for dysphoric mood. Negative for suicidal ideas. The patient is nervous/anxious.    Per HPI unless specifically indicated above     Objective:    BP 138/71    Pulse 77    Temp 98.6 F (37 C) (Oral)    Wt 250 lb 9.6 oz (113.7 kg)    LMP 09/16/2021 (Approximate)    SpO2 96%    BMI 43.02 kg/m   Wt Readings from Last 3 Encounters:  01/19/22 250 lb 9.6 oz (113.7 kg)  01/19/22 250 lb (113.4 kg)  12/31/21 255 lb (115.7 kg)    Physical Exam Vitals and nursing note reviewed.  Constitutional:      General: She is not in acute distress.  Appearance: Normal appearance. She is normal weight. She is not ill-appearing, toxic-appearing or diaphoretic.  HENT:     Head: Normocephalic.     Right Ear: Tympanic membrane and external ear normal.     Left Ear: Tympanic membrane and external ear normal.     Nose: Congestion present.     Mouth/Throat:     Mouth: Mucous membranes are moist.     Pharynx: Oropharynx is clear. Posterior oropharyngeal erythema present. No oropharyngeal exudate.  Eyes:     General:        Right eye: No discharge.        Left eye: No discharge.     Extraocular Movements: Extraocular movements intact.     Conjunctiva/sclera: Conjunctivae normal.     Pupils: Pupils are equal, round, and reactive to light.   Cardiovascular:     Rate and Rhythm: Normal rate and regular rhythm.     Heart sounds: No murmur heard. Pulmonary:     Effort: Pulmonary effort is normal. No respiratory distress.     Breath sounds: Wheezing present. No rales.  Musculoskeletal:     Cervical back: Normal range of motion and neck supple.  Skin:    General: Skin is warm and dry.     Capillary Refill: Capillary refill takes less than 2 seconds.  Neurological:     General: No focal deficit present.     Mental Status: She is alert and oriented to person, place, and time. Mental status is at baseline.  Psychiatric:        Mood and Affect: Mood normal.        Behavior: Behavior normal.        Thought Content: Thought content normal.        Judgment: Judgment normal.    Results for orders placed or performed during the hospital encounter of 12/30/21  POC Urine Pregnancy, ED (not at [facility])  Result Value Ref Range   Preg Test, Ur NEGATIVE NEGATIVE      Assessment & Plan:   Problem List Items Addressed This Visit       Other   Borderline personality disorder (HCC) - Primary    Chronic.  Improved from last visit.  Feels like her medicaitons are the right dose at this time.  Continue with current medication regimen of Seroquel 100mg  and Effexor 37.5mg  daily.  Labs ordered today.  Return to clinic in 2 months for reevaluation.  Call sooner if concerns arise.        Depression    Chronic.  Improved from last visit.  Feels like her medicaitons are the right dose at this time.  Continue with current medication regimen of Seroquel 100mg  and Effexor 37.5mg  daily.  Labs ordered today.  Return to clinic in 2 months for reevaluation.  Call sooner if concerns arise.       Other Visit Diagnoses     Viral upper respiratory tract infection       Ongoing x many weeks. Coughing up green phlegm. And green Phlegm from nose. Wheezing in all lung fields. Will send Cefdinir and Prednisone. Obtain cxray.   Relevant Medications    cefdinir (OMNICEF) 300 MG capsule        Follow up plan: Return in about 2 months (around 03/19/2022) for Depression/Anxiety FU.

## 2022-01-19 NOTE — Progress Notes (Signed)
Jon Billings, NP   Chief Complaint  Patient presents with   Amenorrhea    Neg UPT 2 weeks ago, no spotting or cramping    HPI:      Ms. [name] is a 36 y.o. [phone] whose LMP was Patient's last menstrual period was 09/15/2021 (approximate)., presents today for NP eval of amenorrhea since 10/22. Menses are usually monthly, lasting 7 days, heavy all the days, changing products Q30 min, no BTB, mod dysmen, not improved with NSAIDs. Has always had menorrhagia. No recent sickness/wt change/increased stress. Had neg UPT 2 wks ago although not sex active. Started seroquel about 6 months ago, with recent dose increase 1 wk ago.   Hx of IDA; H/H=9.5/33.1 9/22. Not taking Fe supp due to fear of constipation; already has constipation and takes miralax prn.  She is not sex active, S/p TL.  Neg pap/POS HPV DNA 08/05/21 pap with PCP; repeat due in 1 yr (8/23)  Patient Active Problem List   Diagnosis Date Noted   Depression    Acute cough 12/03/2021   Leg edema 09/30/2021   Dizzinesses 09/11/2021   Palpitations 09/11/2021   Nicotine dependence with current use 06/26/2021   Morbid obesity ([place]) 06/26/2021   Anemia, mild 03/28/2018   History of drug use 12/14/2017   Symptomatic cholelithiasis    Borderline personality disorder ([place]) 04/16/2013   Cannabis abuse 04/16/2013    Past Surgical History:  Procedure Laterality Date   ANKLE SURGERY Left 2012   CESAREAN SECTION  2011   CESAREAN SECTION WITH BILATERAL TUBAL LIGATION Bilateral 06/15/2018   Procedure: CESAREAN SECTION WITH BILATERAL TUBAL LIGATION;  Surgeon: Boykin Nearing, MD;  Location: [facility] ORS;  Service: Obstetrics;  Laterality: Bilateral;   CHOLECYSTECTOMY N/A 09/27/2016   Procedure: LAPAROSCOPIC CHOLECYSTECTOMY WITH INTRAOPERATIVE CHOLANGIOGRAM;  Surgeon: Florene Glen, MD;  Location: [facility] ORS;  Service: General;  Laterality: N/A;   [place] CURETTAGE OF UTERUS  2013   TONSILLECTOMY  2007     Family History  Problem Relation Age of Onset   Arthritis Mother    Cancer Mother        skin   Hyperlipidemia Mother        takes meds   Coronary artery disease Mother        non-obstructive   Cancer Father        Lung   Anxiety disorder Sister    Depression Sister    Heart attack Maternal Grandfather 32   Coronary artery disease Maternal Grandfather 43       s/p CABG   ADD / ADHD Son     Social History   Socioeconomic History   Marital status: Divorced    Spouse name: Not on file   Number of children: 2   Years of education: Not on file   Highest education level: Not on file  Occupational History    Employer: [employer]  Tobacco Use   Smoking status: Every Day    Packs/day: 1.00    Years: 13.00    Pack years: 13.00    Types: Cigarettes   Smokeless tobacco: Never  Vaping Use   Vaping Use: Never used  Substance and Sexual Activity   Alcohol use: No   Drug use: Not Currently   Sexual activity: Not Currently    Birth control/protection: Surgical    Comment: Tubal Ligation  Other Topics Concern   Not on file  Social History Narrative   Divorced mother of 2 (  11 & 3 - as of 09/2021)   Works @ [employer]   NO routine exercise -- active @ work   Smokes 1 PPD - not interested in quitting.   Drinks "lots" of coffee (drinks cold coffee most of the day)   Social Determinants of Health   Financial Resource Strain: Not on file  Food Insecurity: Not on file  Transportation Needs: Not on file  Physical Activity: Not on file  Stress: Not on file  Social Connections: Not on file  Intimate Partner Violence: Not on file    Outpatient Medications Prior to Visit  Medication Sig Dispense Refill   baclofen (LIORESAL) 10 MG tablet Take 10 mg by mouth 3 (three) times daily.     cloNIDine (CATAPRES) 0.1 MG tablet Take 0.1 mg by mouth at bedtime.     QUEtiapine (SEROQUEL) 100 MG tablet Take 1 tablet (100 mg total) by mouth at bedtime. 30 tablet 0    SUBOXONE 12-3 MG FILM Place 0.5 strips under the tongue 4 (four) times daily.     venlafaxine XR (EFFEXOR XR) 37.5 MG 24 hr capsule Take 1 capsule (37.5 mg total) by mouth daily with breakfast. 30 capsule 0   SUBOXONE 8-2 MG FILM Place under the tongue.     No facility-administered medications prior to visit.      ROS:  Review of Systems  Constitutional:  Negative for fever.  Gastrointestinal:  Positive for constipation. Negative for blood in stool, diarrhea, nausea and vomiting.  Genitourinary:  Positive for menstrual problem. Negative for dyspareunia, dysuria, flank pain, frequency, hematuria, urgency, vaginal bleeding, vaginal discharge and vaginal pain.  Musculoskeletal:  Negative for back pain.  Skin:  Negative for rash.  BREAST: No symptoms   OBJECTIVE:   Vitals:  BP 132/70    Ht 5\' 4"  (1.626 m)    Wt 250 lb (113.4 kg)    LMP 09/15/2021 (Approximate)    BMI 42.91 kg/m   Physical Exam Vitals reviewed.  Constitutional:      Appearance: She is well-developed.  Pulmonary:     Effort: Pulmonary effort is normal.  Musculoskeletal:        General: Normal range of motion.     Cervical back: Normal range of motion.  Skin:    General: Skin is warm and dry.  Neurological:     General: No focal deficit present.     Mental Status: She is alert and oriented to person, place, and time.     Cranial Nerves: No cranial nerve deficit.  Psychiatric:        Mood and Affect: Mood normal.        Behavior: Behavior normal.        Thought Content: Thought content normal.        Judgment: Judgment normal.    Assessment/Plan: Amenorrhea - Plan: Prolactin, medroxyPROGESTERone (PROVERA) 10 MG tablet; not sex active, no period since 10/22. Rx provera, f/u with bleeding status. Check prolactin given seroquel use. May be cause of sx. Will f/u with results.   Iron deficiency anemia due to chronic blood loss - Plan: CBC with Differential/Platelet; check CBC today. If HgB too low, will refer to  hematology for Fe infusions. Can try stool softener for constipation.    Meds ordered this encounter  Medications   medroxyPROGESTERone (PROVERA) 10 MG tablet    Sig: Take 1 tablet (10 mg total) by mouth daily for 7 days.    Dispense:  7 tablet    Refill:  0    Order Specific Question:   Supervising Provider    Answer:   Gae Dry [id number]      Return if symptoms worsen or fail to improve.  Matteo Banke B. [place] Dube, PA-C 01/19/2022 4:51 PM

## 2022-01-19 NOTE — Assessment & Plan Note (Signed)
Chronic.  Improved from last visit.  Feels like her medicaitons are the right dose at this time.  Continue with current medication regimen of Seroquel 100mg and Effexor 37.5mg daily.  Labs ordered today.  Return to clinic in 2 months for reevaluation.  Call sooner if concerns arise.  ° °

## 2022-01-19 NOTE — Assessment & Plan Note (Signed)
Chronic.  Improved from last visit.  Feels like her medicaitons are the right dose at this time.  Continue with current medication regimen of Seroquel 100mg  and Effexor 37.5mg  daily.  Labs ordered today.  Return to clinic in 2 months for reevaluation.  Call sooner if concerns arise.

## 2022-01-20 ENCOUNTER — Ambulatory Visit
Admission: RE | Admit: 2022-01-20 | Discharge: 2022-01-20 | Disposition: A | Discharge status: Home / Self Care | Payer: Medicaid Other | Source: Ambulatory Visit | Attending: Nurse Practitioner | Admitting: Nurse Practitioner

## 2022-01-20 ENCOUNTER — Encounter: Payer: Self-pay | Admitting: Nurse Practitioner

## 2022-01-20 ENCOUNTER — Ambulatory Visit
Admission: RE | Admit: 2022-01-20 | Discharge: 2022-01-20 | Disposition: A | Discharge status: Home / Self Care | Payer: Medicaid Other | Attending: Nurse Practitioner | Admitting: Nurse Practitioner

## 2022-01-20 DIAGNOSIS — R051 Acute cough: Secondary | ICD-10-CM | POA: Diagnosis present

## 2022-01-20 LAB — CBC WITH DIFFERENTIAL/PLATELET
Basophils Absolute: 0 10*3/uL (ref 0.0–0.2)
Basos: 1 %
EOS (ABSOLUTE): 0.2 10*3/uL (ref 0.0–0.4)
Eos: 3 %
Hematocrit: 31 % — ABNORMAL LOW (ref 34.0–46.6)
Hemoglobin: 9.2 g/dL — ABNORMAL LOW (ref 11.1–15.9)
Immature Grans (Abs): 0 10*3/uL (ref 0.0–0.1)
Immature Granulocytes: 0 %
Lymphocytes Absolute: 1.7 10*3/uL (ref 0.7–3.1)
Lymphs: 24 %
MCH: 21 pg — ABNORMAL LOW (ref 26.6–33.0)
MCHC: 29.7 g/dL — ABNORMAL LOW (ref 31.5–35.7)
MCV: 71 fL — ABNORMAL LOW (ref 79–97)
Monocytes Absolute: 0.4 10*3/uL (ref 0.1–0.9)
Monocytes: 6 %
Neutrophils Absolute: 4.7 10*3/uL (ref 1.4–7.0)
Neutrophils: 66 %
Platelets: 207 10*3/uL (ref 150–450)
RBC: 4.39 x10E6/uL (ref 3.77–5.28)
RDW: 20.2 % — ABNORMAL HIGH (ref 11.7–15.4)
WBC: 7.1 10*3/uL (ref 3.4–10.8)

## 2022-01-20 LAB — PROLACTIN: Prolactin: 12 ng/mL (ref 4.8–23.3)

## 2022-01-20 MED ORDER — DOXYCYCLINE HYCLATE 100 MG PO TABS: 100.0000 mg | ORAL_TABLET | Freq: Two times a day (BID) | ORAL | 0 refills | Status: AC

## 2022-01-20 NOTE — Progress Notes (Signed)
Attempted to call patient and left general HIPAA compliant message.  Contacted via MyChart -- patient needs follow-up with [name] in 2 weeks for pneumonia please   Good afternoon [name], your imaging has returned -- I reviewed both images and report.  You do look like you have a pneumonia present.  I am going to send in Doxycycline since you are allergic to Amoxicillin -- we will treat with one antibiotic at this time since you do not have any other underlying health issues like diabetes or high blood pressure.  I am going to have staff call you and would like you to schedule follow-up with [name] in 2 weeks to recheck lungs.  Any questions? Keep being stellar!!  Thank you for allowing me to participate in your care.  I appreciate you. Kindest regards, Malaiya Paczkowski

## 2022-01-21 ENCOUNTER — Encounter: Payer: Self-pay | Admitting: Obstetrics and Gynecology

## 2022-01-21 DIAGNOSIS — D500 Iron deficiency anemia secondary to blood loss (chronic): Secondary | ICD-10-CM

## 2022-01-21 NOTE — Progress Notes (Signed)
Follow up appointment scheduled.

## 2022-01-23 ENCOUNTER — Other Ambulatory Visit: Payer: Self-pay | Admitting: Obstetrics and Gynecology

## 2022-01-23 DIAGNOSIS — N912 Amenorrhea, unspecified: Secondary | ICD-10-CM

## 2022-01-28 ENCOUNTER — Encounter: Payer: Self-pay | Admitting: Orthopedic Surgery

## 2022-01-28 ENCOUNTER — Other Ambulatory Visit: Payer: Self-pay

## 2022-01-28 ENCOUNTER — Other Ambulatory Visit: Payer: Self-pay | Admitting: Orthopedic Surgery

## 2022-01-28 ENCOUNTER — Ambulatory Visit: Payer: Medicaid Other | Admitting: Orthopedic Surgery

## 2022-01-28 VITALS — BP 144/88 | HR 95 | Ht 64.0 in | Wt 250.0 lb

## 2022-01-28 DIAGNOSIS — M2241 Chondromalacia patellae, right knee: Secondary | ICD-10-CM

## 2022-01-28 MED ORDER — CELECOXIB 200 MG PO CAPS
200.0000 mg | ORAL_CAPSULE | Freq: Two times a day (BID) | ORAL | 0 refills | Status: DC
Start: 2022-01-28 — End: 2022-03-02

## 2022-01-28 NOTE — Patient Instructions (Signed)
Sign release for records from [employer]  Lose weight   Start physical therapy

## 2022-01-28 NOTE — Progress Notes (Signed)
Chief Complaint  Patient presents with   Knee Pain    Right knee pain for years getting worse went to ER    Presents to [place] 1 day earlier than her scheduled appointment as she got the dates mixed up for evaluation of acute on chronic right knee pain.  She was seen in the past by [facility] had an MRI but we do not have notes or images  She went to the emergency room on December 30, 2021 at [facility] complaining of worsening right knee pain indicating that she had gotten an injection in May of last year and got some relief of pain but it was short-lived  She has tried Voltaren gel as well as meloxicam.  The meloxicam gave her headache  She is on Medicaid so are NSAID choices are limited.  She also says that she has not been able to go to physical therapy because she was working at the time.  She says the pain is getting worse  Her pain is all around the front of the knee she gives a theater sign and then says she has pain in the back of the knee that runs down the leg  She does indicate a history of lower back symptoms and gets adjustments from the chiropractor  She filled out a review of systems form for [place] and indicated neck back and joint pain in all other systems she listed as negative  She indicated no medical problems other than anxiety depression and arthritis  Past Surgical History:  Procedure Laterality Date   ANKLE SURGERY Left 2012   CESAREAN SECTION  2011   CESAREAN SECTION WITH BILATERAL TUBAL LIGATION Bilateral 06/15/2018   Procedure: CESAREAN SECTION WITH BILATERAL TUBAL LIGATION;  Surgeon: Suzy Bouchard, MD;  Location: [facility] ORS;  Service: Obstetrics;  Laterality: Bilateral;   CHOLECYSTECTOMY N/A 09/27/2016   Procedure: LAPAROSCOPIC CHOLECYSTECTOMY WITH INTRAOPERATIVE CHOLANGIOGRAM;  Surgeon: Lattie Haw, MD;  Location: [facility] ORS;  Service: General;  Laterality: N/A;   DILATION AND CURETTAGE OF UTERUS  2013   TONSILLECTOMY  2007   Meds  ordered this encounter  Medications   celecoxib (CELEBREX) 200 MG capsule    Sig: Take 1 capsule (200 mg total) by mouth 2 (two) times daily.    Dispense:  60 capsule    Refill:  0   Allergies  Allergen Reactions   Clindamycin/Lincomycin Anaphylaxis and Rash   Gabapentin Swelling    Leg swelling   Amoxicillin Rash    Has patient had a PCN reaction causing immediate rash, facial/tongue/throat swelling, SOB or lightheadedness with hypotension: Yes Has patient had a PCN reaction causing severe rash involving mucus membranes or skin necrosis: No Has patient had a PCN reaction that required hospitalization: No Has patient had a PCN reaction occurring within the last 10 years: No If all of the above answers are "NO", then may proceed with Cephalosporin use.     Social History   Tobacco Use   Smoking status: Every Day    Packs/day: 1.00    Years: 13.00    Pack years: 13.00    Types: Cigarettes   Smokeless tobacco: Never  Vaping Use   Vaping Use: Never used  Substance Use Topics   Alcohol use: No   Drug use: Not Currently    BP (!) 144/88    Pulse 95    Ht 5\' 4"  (1.626 m)    Wt 250 lb (113.4 kg)    BMI 42.91 kg/m  The patient meets the AMA guidelines for Morbid (severe) obesity with a BMI > 40.0 and I have recommended weight loss.  Appearance I find no gross abnormalities no developmental abnormalities  She is oriented x3 normal  Her mood is somnolent and affect is flat  Gait was normal  We evaluated the right knee patella was very sensitive there was no fluid on the joint she was tender around the patella and medial joint line her range of motion was normal she did have some pain when I range the knee but it was full range of motion the knee was stable in all planes muscle tone and strength were normal she had no skin abnormalities no skin lesions she had good distal pulses normal temperature of the extremity no edema or swelling distally sensation was normal  Images were  obtained at [facility] on 24 January and she does indeed have by my interpretation arthritis of the medial compartment it is mild but she does have some bone spurs the joint space seems very well-preserved no deformities in terms of alignment  Assessment and plan  36 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

## 2022-01-29 ENCOUNTER — Ambulatory Visit: Payer: Medicaid Other | Admitting: Orthopedic Surgery

## 2022-01-29 ENCOUNTER — Encounter: Payer: Self-pay | Admitting: *Deleted

## 2022-01-30 ENCOUNTER — Telehealth: Payer: Self-pay | Admitting: *Deleted

## 2022-01-30 NOTE — Telephone Encounter (Signed)
Attempted to reach NP prior to her apt on Monday, 02/02/22 with Dr. Janese Banks for IDA. No answer. RN unable to leave vm as mailbox is full.  Mychart msg-reminder sent to patient.

## 2022-02-02 ENCOUNTER — Inpatient Hospital Stay: Payer: Medicaid Other | Attending: Oncology | Admitting: Oncology

## 2022-02-02 ENCOUNTER — Other Ambulatory Visit: Payer: Self-pay

## 2022-02-02 ENCOUNTER — Encounter: Payer: Self-pay | Admitting: Oncology

## 2022-02-02 ENCOUNTER — Inpatient Hospital Stay: Payer: Medicaid Other

## 2022-02-02 VITALS — BP 119/78 | HR 100 | Temp 98.7°F | Resp 20 | Wt 237.9 lb

## 2022-02-02 DIAGNOSIS — D509 Iron deficiency anemia, unspecified: Secondary | ICD-10-CM | POA: Insufficient documentation

## 2022-02-02 DIAGNOSIS — F1411 Cocaine abuse, in remission: Secondary | ICD-10-CM | POA: Diagnosis not present

## 2022-02-02 DIAGNOSIS — R062 Wheezing: Secondary | ICD-10-CM | POA: Diagnosis not present

## 2022-02-02 DIAGNOSIS — D508 Other iron deficiency anemias: Secondary | ICD-10-CM

## 2022-02-02 DIAGNOSIS — F1221 Cannabis dependence, in remission: Secondary | ICD-10-CM | POA: Diagnosis not present

## 2022-02-02 DIAGNOSIS — Z79899 Other long term (current) drug therapy: Secondary | ICD-10-CM | POA: Insufficient documentation

## 2022-02-02 DIAGNOSIS — F1911 Other psychoactive substance abuse, in remission: Secondary | ICD-10-CM | POA: Diagnosis not present

## 2022-02-02 LAB — FOLATE: Folate: 10.5 ng/mL (ref >5.9)

## 2022-02-02 LAB — CBC
HCT: 37 % (ref 36.0–46.0)
Hemoglobin: 10.7 g/dL — ABNORMAL LOW (ref 12.0–15.0)
MCH: 20.7 pg — ABNORMAL LOW (ref 26.0–34.0)
MCHC: 28.9 g/dL — ABNORMAL LOW (ref 30.0–36.0)
MCV: 71.6 fL — ABNORMAL LOW (ref 80.0–100.0)
Platelets: 204 10*3/uL (ref 150–400)
RBC: 5.17 MIL/uL — ABNORMAL HIGH (ref 3.87–5.11)
RDW: 20.3 % — ABNORMAL HIGH (ref 11.5–15.5)
WBC: 6.6 10*3/uL (ref 4.0–10.5)
nRBC: 0 % (ref 0.0–0.2)

## 2022-02-02 LAB — FERRITIN: Ferritin: 6 ng/mL — ABNORMAL LOW (ref 11–307)

## 2022-02-02 LAB — VITAMIN B12: Vitamin B-12: 299 pg/mL (ref 180–914)

## 2022-02-02 LAB — IRON AND TIBC
Iron: 30 ug/dL (ref 28–170)
Saturation Ratios: 6 % — ABNORMAL LOW (ref 10.4–31.8)
TIBC: 518 ug/dL — ABNORMAL HIGH (ref 250–450)
UIBC: 488 ug/dL

## 2022-02-02 NOTE — Progress Notes (Signed)
Hematology/Oncology Consult note [facility] Telephone:([phone] [phone]) [phone]  Patient Care Team: Jon Billings, NP as PCP - General (Nurse Practitioner) Sindy Guadeloupe, MD as Consulting Physician (Hematology) Copland, Ginette Otto as Referring Physician (Obstetrics and Gynecology)   Name of the patient: [name]  [id number]  1986-09-22    Reason for referral-microcytic anemia   Referring physician-Alicia Edilia Bo, PA  Date of visit: 02/02/22   History of presenting illness-patient is a 36 year old female with a past medical history significant for history of drug abuse currently on Suboxone referred for microcytic anemia.  Her most recentCBC from 01/19/2022 showed an H&H of 9.2/31 with an MCV of 71.  White count and platelets were normal.  No recent iron studies have been checked.  She does report that her menstrual cycles have been irregular.  When she does get them they are heavy but she has not had menstrual cycles for the last 4 months and was seen by GYN.  UPT was negative.  She has tried oral iron in the past but does not tolerate it well due to constipation.  Currently reports ongoing fatigue.  Denies any blood loss in her stool or urine.  Denies any dark melanotic stools.  Denies any consistent use of NSAIDs.  Denies any family history of colon cancer  ECOG PS- 0  Pain scale- 0   Review of systems- Review of Systems  Constitutional:  Positive for malaise/fatigue. Negative for chills, fever and weight loss.  HENT:  Negative for congestion, ear discharge and nosebleeds.   Eyes:  Negative for blurred vision.  Respiratory:  Negative for cough, hemoptysis, sputum production, shortness of breath and wheezing.   Cardiovascular:  Negative for chest pain, palpitations, orthopnea and claudication.  Gastrointestinal:  Negative for abdominal pain, blood in stool, constipation, diarrhea, heartburn, melena, nausea and vomiting.   Genitourinary:  Negative for dysuria, flank pain, frequency, hematuria and urgency.  Musculoskeletal:  Negative for back pain, joint pain and myalgias.  Skin:  Negative for rash.  Neurological:  Negative for dizziness, tingling, focal weakness, seizures, weakness and headaches.  Endo/Heme/Allergies:  Does not bruise/bleed easily.  Psychiatric/Behavioral:  Negative for depression and suicidal ideas. The patient does not have insomnia.    Allergies  Allergen Reactions   Clindamycin/Lincomycin Anaphylaxis and Rash   Gabapentin Swelling    Leg swelling   Amoxicillin Rash    Has patient had a PCN reaction causing immediate rash, facial/tongue/throat swelling, SOB or lightheadedness with hypotension: Yes Has patient had a PCN reaction causing severe rash involving mucus membranes or skin necrosis: No Has patient had a PCN reaction that required hospitalization: No Has patient had a PCN reaction occurring within the last 10 years: No If all of the above answers are "NO", then may proceed with Cephalosporin use.     Patient Active Problem List   Diagnosis Date Noted   Microcytic anemia 02/02/2022   Depression    Acute cough 12/03/2021   Leg edema 09/30/2021   Dizzinesses 09/11/2021   Palpitations 09/11/2021   Nicotine dependence with current use 06/26/2021   Morbid obesity ([place]) 06/26/2021   Anemia, mild 03/28/2018   History of drug use 12/14/2017   Symptomatic cholelithiasis    Painful orthopaedic hardware ([place]) 07/23/2015   Borderline personality disorder ([place]) 04/16/2013   Cannabis abuse 04/16/2013   Cocaine abuse ([place]) 04/16/2013   Suicidal behavior 04/16/2013     Past Medical History:  Diagnosis Date   Borderline personality disorder ([place]) 04/16/2013  Cannabis abuse 2014   Chondromalacia patellae, right knee    Depression    Well-controlled with Zoloft   Gestational diabetes    Has prediabetes now.   Headache    Heroin abuse (HCC)    History of drug use 12/15/2017    IDA (iron deficiency anemia)    Migraines    Morbid obesity with BMI of 40.0-44.9, adult (HCC)    Nicotine dependence with current use 06/26/2021   Osteoarthritis, knee      Past Surgical History:  Procedure Laterality Date   ANKLE SURGERY Left 2012   CESAREAN SECTION  2011   CESAREAN SECTION WITH BILATERAL TUBAL LIGATION Bilateral 06/15/2018   Procedure: CESAREAN SECTION WITH BILATERAL TUBAL LIGATION;  Surgeon: Suzy Bouchard, MD;  Location: [facility] ORS;  Service: Obstetrics;  Laterality: Bilateral;   CHOLECYSTECTOMY N/A 09/27/2016   Procedure: LAPAROSCOPIC CHOLECYSTECTOMY WITH INTRAOPERATIVE CHOLANGIOGRAM;  Surgeon: Lattie Haw, MD;  Location: [facility] ORS;  Service: General;  Laterality: N/A;   DILATION AND CURETTAGE OF UTERUS  2013   TONSILLECTOMY  2007    Social History   Socioeconomic History   Marital status: Divorced    Spouse name: Not on file   Number of children: 2   Years of education: Not on file   Highest education level: Not on file  Occupational History    Employer: [employer]  Tobacco Use   Smoking status: Every Day    Packs/day: 1.00    Years: 13.00    Pack years: 13.00    Types: Cigarettes   Smokeless tobacco: Never  Vaping Use   Vaping Use: Never used  Substance and Sexual Activity   Alcohol use: No   Drug use: Not Currently    Comment: history of heroin and marijuana use   Sexual activity: Not Currently    Birth control/protection: Surgical    Comment: Tubal Ligation  Other Topics Concern   Not on file  Social History Narrative   Divorced mother of 2 (26 & 3 - as of 09/2021)   Works @ [employer]   NO routine exercise -- active @ work   Smokes 1 PPD - not interested in quitting.   Drinks "lots" of coffee (drinks cold coffee most of the day)   Social Determinants of Health   Financial Resource Strain: Not on file  Food Insecurity: Not on file  Transportation Needs: Not on file  Physical Activity: Not on file   Stress: Not on file  Social Connections: Not on file  Intimate Partner Violence: Not on file     Family History  Problem Relation Age of Onset   Arthritis Mother    Cancer Mother        skin   Hyperlipidemia Mother        takes meds   Coronary artery disease Mother        non-obstructive   Cancer Father        Lung   Anxiety disorder Sister    Depression Sister    Heart attack Maternal Grandfather 32   Coronary artery disease Maternal Grandfather 12       s/p CABG   ADD / ADHD Son      Current Outpatient Medications:    baclofen (LIORESAL) 10 MG tablet, Take 10 mg by mouth 3 (three) times daily., Disp: , Rfl:    celecoxib (CELEBREX) 200 MG capsule, Take 1 capsule (200 mg total) by mouth 2 (two) times daily., Disp: 60 capsule, Rfl: 0  cloNIDine (CATAPRES) 0.1 MG tablet, Take 0.1 mg by mouth at bedtime., Disp: , Rfl:    QUEtiapine (SEROQUEL) 100 MG tablet, Take 1 tablet (100 mg total) by mouth at bedtime., Disp: 30 tablet, Rfl: 0   SUBOXONE 12-3 MG FILM, Place 0.5 strips under the tongue 4 (four) times daily., Disp: , Rfl:    venlafaxine XR (EFFEXOR XR) 37.5 MG 24 hr capsule, Take 1 capsule (37.5 mg total) by mouth daily with breakfast., Disp: 30 capsule, Rfl: 0   Physical exam:  Vitals:   02/02/22 1038  BP: 119/78  Pulse: 100  Resp: 20  Temp: 98.7 F (37.1 C)  SpO2: 97%  Weight: 237 lb 14.4 oz (107.9 kg)   Physical Exam Constitutional:      General: She is not in acute distress. Cardiovascular:     Rate and Rhythm: Normal rate and regular rhythm.     Heart sounds: Normal heart sounds.  Pulmonary:     Effort: Pulmonary effort is normal.     Breath sounds: Normal breath sounds.  Abdominal:     General: Bowel sounds are normal.     Palpations: Abdomen is soft.  Skin:    General: Skin is warm and dry.  Neurological:     Mental Status: She is alert and oriented to person, place, and time.       CMP Latest Ref Rng & Units 09/03/2021  Glucose 70 - 99 mg/dL  73  BUN 6 - 20 mg/dL 12  Creatinine 0.57 - 1.00 mg/dL 0.68  Sodium 134 - 144 mmol/L 139  Potassium 3.5 - 5.2 mmol/L 4.2  Chloride 96 - 106 mmol/L 103  CO2 20 - 29 mmol/L 23  Calcium 8.7 - 10.2 mg/dL 8.8  Total Protein 6.0 - 8.5 g/dL 6.1  Total Bilirubin 0.0 - 1.2 mg/dL <0.2  Alkaline Phos 44 - 121 IU/L 83  AST 0 - 40 IU/L 16  ALT 0 - 32 IU/L 23   CBC Latest Ref Rng & Units 02/02/2022  WBC 4.0 - 10.5 K/uL 6.6  Hemoglobin 12.0 - 15.0 g/dL 10.7(L)  Hematocrit 36.0 - 46.0 % 37.0  Platelets 150 - 400 K/uL 204    No images are attached to the encounter.  DG Chest 2 View  Result Date: 01/20/2022 CLINICAL DATA:  36 year old female with wheezing and cough EXAM: CHEST - 2 VIEW COMPARISON:  01/28/2019 FINDINGS: Cardiomediastinal silhouette unchanged in size and contour. Reticulonodular opacities in the lung bases new from the prior. No pneumothorax or pleural effusion. No displaced fracture. IMPRESSION: New reticulonodular opacities, concerning for multifocal infection Electronically Signed   By: Corrie Mckusick D.O.   On: 01/20/2022 14:09    Assessment and plan- Patient is a 36 y.o. female referred for microcytic anemia likely secondary to iron deficiency  We will check CBC ferritin iron studies B12 and folate today.  Discussed that her iron deficiency anemia could be secondary to her heavy menstrual cycles in the past although she has not had a menstrual cycle in the last 4 months.  I will hold off on GI referral at this time.  Since patient cannot tolerate oral iron we will proceed with IV iron.  Her insurance does approve Feraheme and therefore I will plan to give her 2 doses of Feraheme 510 mg IV weekly.  Discussed risks and benefits of Feraheme including all but not limited to possible risk of infusion reaction and anaphylaxis.  Patient understands and agrees to proceed as planned.  Repeat CBC ferritin  iron studies in 6 weeks and I will see her thereafter   Thank you for this kind referral  and the opportunity to participate in the care of this patient   Visit Diagnosis 1. Other iron deficiency anemia   2. Microcytic anemia     Dr. Randa Evens, MD, MPH [facility] at [facility] [id number] 02/02/2022

## 2022-02-03 ENCOUNTER — Telehealth: Payer: Self-pay

## 2022-02-03 ENCOUNTER — Other Ambulatory Visit: Payer: Self-pay | Admitting: Nurse Practitioner

## 2022-02-03 ENCOUNTER — Encounter: Payer: Self-pay | Admitting: Nurse Practitioner

## 2022-02-03 ENCOUNTER — Ambulatory Visit (INDEPENDENT_AMBULATORY_CARE_PROVIDER_SITE_OTHER): Payer: Medicaid Other | Admitting: Nurse Practitioner

## 2022-02-03 VITALS — BP 114/77 | HR 87 | Temp 98.3°F | Wt 239.0 lb

## 2022-02-03 DIAGNOSIS — R051 Acute cough: Secondary | ICD-10-CM | POA: Diagnosis not present

## 2022-02-03 MED ORDER — FLUCONAZOLE 150 MG PO TABS: 150.0000 mg | ORAL_TABLET | Freq: Once | ORAL | 0 refills | Status: AC

## 2022-02-03 NOTE — Telephone Encounter (Signed)
Patient aware to take oral b12 1000 mcg daily and expressed understanding.

## 2022-02-03 NOTE — Assessment & Plan Note (Signed)
Will obtain new chest xray.  If negative will do spirometry to evaluate breathing.  Will treat based on imaging results.  Follow up after imaging is complete.

## 2022-02-03 NOTE — Progress Notes (Signed)
BP 114/77    Pulse 87    Temp 98.3 F (36.8 C) (Oral)    Wt 239 lb (108.4 kg) Comment: pt reported   LMP 10/16/2021 (Approximate)    SpO2 98%    BMI 41.02 kg/m    Subjective:    Patient ID: [name], female    DOB: [birth year], 36 y.o.   MRN: [id number]  HPI: [name] is a 36 y.o. female  Chief Complaint  Patient presents with   Pneumonia    2 week f/up    PNEUMONIA Patient completed the prednisone and antibiotic after chest xray showed pneumonia.  Symptoms resolved.  Now they have returned.  The cough and wheezing are not as bad yet.  Her nasal congestion has resolved.    Relevant past medical, surgical, family and social history reviewed and updated as indicated. Interim medical history since our last visit reviewed. Allergies and medications reviewed and updated.  Review of Systems  Constitutional:  Negative for fever.  HENT:  Negative for congestion.   Respiratory:  Positive for cough and wheezing. Negative for shortness of breath.    Per HPI unless specifically indicated above     Objective:    BP 114/77    Pulse 87    Temp 98.3 F (36.8 C) (Oral)    Wt 239 lb (108.4 kg) Comment: pt reported   LMP 10/16/2021 (Approximate)    SpO2 98%    BMI 41.02 kg/m   Wt Readings from Last 3 Encounters:  02/03/22 239 lb (108.4 kg)  02/02/22 237 lb 14.4 oz (107.9 kg)  01/28/22 250 lb (113.4 kg)    Physical Exam Vitals and nursing note reviewed.  Constitutional:      General: She is not in acute distress.    Appearance: Normal appearance. She is normal weight. She is not ill-appearing, toxic-appearing or diaphoretic.  HENT:     Head: Normocephalic.     Right Ear: External ear normal.     Left Ear: External ear normal.     Nose: Nose normal.     Mouth/Throat:     Mouth: Mucous membranes are moist.     Pharynx: Oropharynx is clear.  Eyes:     General:        Right eye: No discharge.        Left eye: No discharge.     Extraocular Movements: Extraocular  movements intact.     Conjunctiva/sclera: Conjunctivae normal.     Pupils: Pupils are equal, round, and reactive to light.  Cardiovascular:     Rate and Rhythm: Normal rate and regular rhythm.     Heart sounds: No murmur heard. Pulmonary:     Effort: Pulmonary effort is normal. No respiratory distress.     Breath sounds: Wheezing present. No rales.  Musculoskeletal:     Cervical back: Normal range of motion and neck supple.  Skin:    General: Skin is warm and dry.     Capillary Refill: Capillary refill takes less than 2 seconds.  Neurological:     General: No focal deficit present.     Mental Status: She is alert and oriented to person, place, and time. Mental status is at baseline.  Psychiatric:        Mood and Affect: Mood normal.        Behavior: Behavior normal.        Thought Content: Thought content normal.        Judgment: Judgment normal.  Results for orders placed or performed in visit on 02/02/22  Vitamin B12  Result Value Ref Range   Vitamin B-12 299 180 - 914 pg/mL  Folate  Result Value Ref Range   Folate 10.5 >5.9 ng/mL  CBC  Result Value Ref Range   WBC 6.6 4.0 - 10.5 K/uL   RBC 5.17 (H) 3.87 - 5.11 MIL/uL   Hemoglobin 10.7 (L) 12.0 - 15.0 g/dL   HCT 10.2 58.5 - 27.7 %   MCV 71.6 (L) 80.0 - 100.0 fL   MCH 20.7 (L) 26.0 - 34.0 pg   MCHC 28.9 (L) 30.0 - 36.0 g/dL   RDW 82.4 (H) 23.5 - 36.1 %   Platelets 204 150 - 400 K/uL   nRBC 0.0 0.0 - 0.2 %  Ferritin  Result Value Ref Range   Ferritin 6 (L) 11 - 307 ng/mL      Assessment & Plan:   Problem List Items Addressed This Visit       Other   Acute cough - Primary    Will obtain new chest xray.  If negative will do spirometry to evaluate breathing.  Will treat based on imaging results.  Follow up after imaging is complete.      Relevant Orders   DG Chest 2 View     Follow up plan: Return if symptoms worsen or fail to improve.

## 2022-02-03 NOTE — Telephone Encounter (Signed)
-----   Message from Creig Hines, MD sent at 02/02/2022  2:50 PM EST ----- She needs to take po b12 1000 mcg daily

## 2022-02-04 ENCOUNTER — Inpatient Hospital Stay: Payer: Medicaid Other | Attending: Oncology

## 2022-02-04 ENCOUNTER — Ambulatory Visit
Admission: RE | Admit: 2022-02-04 | Discharge: 2022-02-04 | Disposition: A | Discharge status: Home / Self Care | Payer: Medicaid Other | Source: Home / Self Care | Attending: Nurse Practitioner | Admitting: Nurse Practitioner

## 2022-02-04 ENCOUNTER — Other Ambulatory Visit: Payer: Self-pay

## 2022-02-04 ENCOUNTER — Ambulatory Visit
Admission: RE | Admit: 2022-02-04 | Discharge: 2022-02-04 | Disposition: A | Discharge status: Home / Self Care | Payer: Medicaid Other | Source: Ambulatory Visit | Attending: Nurse Practitioner | Admitting: Nurse Practitioner

## 2022-02-04 VITALS — BP 135/79 | HR 79 | Temp 98.3°F | Resp 18

## 2022-02-04 DIAGNOSIS — R051 Acute cough: Secondary | ICD-10-CM | POA: Insufficient documentation

## 2022-02-04 DIAGNOSIS — D509 Iron deficiency anemia, unspecified: Secondary | ICD-10-CM

## 2022-02-04 MED ORDER — SODIUM CHLORIDE 0.9 % IV SOLN
510.0000 mg | INTRAVENOUS | Status: DC
  Administered 2022-02-04: 510 mg via INTRAVENOUS
  Filled 2022-02-04: qty 510

## 2022-02-04 MED ORDER — SODIUM CHLORIDE 0.9 % IV SOLN
Freq: Once | INTRAVENOUS | Status: AC
  Filled 2022-02-04: qty 250

## 2022-02-04 NOTE — Telephone Encounter (Signed)
Requested medication (s) are due for refill today:   Yes ? ?Requested medication (s) are on the active medication list:   Yes ? ?Future visit scheduled:   Yes ? ? ?Last ordered: 12/31/2021 #30, 0 refills ? ?Returned because protocol criteria not met.   Labs out of range.  Has a f/u appt for evaluation of how med is doing for her.   Provider to review for refills prior to this appt.  ? ?Requested Prescriptions  ?Pending Prescriptions Disp Refills  ? venlafaxine XR (EFFEXOR-XR) 37.5 MG 24 hr capsule [Pharmacy Med Name: VENLAFAXINE ER 37.$RemoveBeforeDEI'5MG'CHmRnfuzBEckkHCA$  CAPSULES] 30 capsule 0  ?  Sig: TAKE 1 CAPSULE(37.5 MG) BY MOUTH DAILY WITH BREAKFAST  ?  ? Psychiatry: Antidepressants - SNRI - desvenlafaxine & venlafaxine Failed - 02/03/2022  1:01 PM  ?  ?  Failed - Lipid Panel in normal range within the last 12 months  ?  Cholesterol, Total  ?Date Value Ref Range Status  ?08/05/2021 227 (H) 100 - 199 mg/dL Final  ? ?Cholesterol  ?Date Value Ref Range Status  ?07/29/2012 186 0 - 200 mg/dL Final  ? ?Ldl Cholesterol, Calc  ?Date Value Ref Range Status  ?07/29/2012 98 0 - 100 mg/dL Final  ? ?LDL Chol Calc (NIH)  ?Date Value Ref Range Status  ?08/05/2021 147 (H) 0 - 99 mg/dL Final  ? ?HDL Cholesterol  ?Date Value Ref Range Status  ?07/29/2012 18 (L) 40 - 60 mg/dL Final  ? ?HDL  ?Date Value Ref Range Status  ?08/05/2021 41 >39 mg/dL Final  ? ?Triglycerides  ?Date Value Ref Range Status  ?08/05/2021 215 (H) 0 - 149 mg/dL Final  ?07/29/2012 351 (H) 0 - 200 mg/dL Final  ? ?  ?  ?  Passed - Cr in normal range and within 360 days  ?  Creatinine  ?Date Value Ref Range Status  ?04/28/2014 0.79 0.60 - 1.30 mg/dL Final  ? ?Creatinine, Ser  ?Date Value Ref Range Status  ?09/03/2021 0.68 0.57 - 1.00 mg/dL Final  ?  ?  ?  ?  Passed - Completed PHQ-2 or PHQ-9 in the last 360 days  ?  ?  Passed - Last BP in normal range  ?  BP Readings from Last 1 Encounters:  ?02/03/22 114/77  ?  ?  ?  ?  Passed - Valid encounter within last 6 months  ?  Recent Outpatient Visits    ? ?      ? Yesterday Acute cough  ? [place], NP  ? 2 weeks ago Borderline personality disorder [facility])  ? [place], NP  ? 1 month ago Borderline personality disorder [facility])  ? [place], NP  ? 2 months ago Acute cough  ? [place], [place], NP  ? 4 months ago Leg edema  ? [facility], [place], NP  ? ?  ?  ?Future Appointments   ? ?        ? In 1 month Jon Billings, NP [facility]  ? ?  ? ?  ?  ?  ?Yes ?

## 2022-02-06 NOTE — Progress Notes (Signed)
Please let patient know that her pneumonia has resolved.  I would like her to come back and have an appointment for spirometry as discussed during our recent visit.  Please make her an appt.

## 2022-02-07 ENCOUNTER — Encounter: Payer: Self-pay | Admitting: Obstetrics and Gynecology

## 2022-02-07 DIAGNOSIS — N912 Amenorrhea, unspecified: Secondary | ICD-10-CM

## 2022-02-09 ENCOUNTER — Other Ambulatory Visit: Payer: Self-pay

## 2022-02-09 ENCOUNTER — Ambulatory Visit (INDEPENDENT_AMBULATORY_CARE_PROVIDER_SITE_OTHER): Payer: Medicaid Other | Admitting: Nurse Practitioner

## 2022-02-09 ENCOUNTER — Encounter: Payer: Self-pay | Admitting: Nurse Practitioner

## 2022-02-09 VITALS — BP 130/83 | HR 71 | Temp 99.7°F

## 2022-02-09 DIAGNOSIS — J449 Chronic obstructive pulmonary disease, unspecified: Secondary | ICD-10-CM | POA: Diagnosis not present

## 2022-02-09 DIAGNOSIS — J069 Acute upper respiratory infection, unspecified: Secondary | ICD-10-CM

## 2022-02-09 DIAGNOSIS — R062 Wheezing: Secondary | ICD-10-CM

## 2022-02-09 MED ORDER — BUDESONIDE-FORMOTEROL FUMARATE 160-4.5 MCG/ACT IN AERO: 2.0000 | INHALATION_SPRAY | Freq: Two times a day (BID) | RESPIRATORY_TRACT | 3 refills | Status: DC

## 2022-02-09 MED ORDER — DOXYCYCLINE HYCLATE 100 MG PO TABS: 100.0000 mg | ORAL_TABLET | Freq: Two times a day (BID) | ORAL | 0 refills | Status: DC

## 2022-02-09 MED ORDER — ALBUTEROL SULFATE (2.5 MG/3ML) 0.083% IN NEBU
2.5000 mg | INHALATION_SOLUTION | Freq: Once | RESPIRATORY_TRACT | Status: AC
  Administered 2022-02-09: 2.5 mg via RESPIRATORY_TRACT

## 2022-02-09 MED ORDER — ALBUTEROL SULFATE HFA 108 (90 BASE) MCG/ACT IN AERS: 2.0000 | INHALATION_SPRAY | Freq: Four times a day (QID) | RESPIRATORY_TRACT | 0 refills | Status: DC | PRN

## 2022-02-09 NOTE — Progress Notes (Signed)
Results discussed with patient during visit.  Shows early COPD.

## 2022-02-09 NOTE — Assessment & Plan Note (Signed)
Chronic. Evaluated via Spirometry.  Has ongoing wheezing, cough and SOB. Will start Spiriva. Discussed how to properly use and side effects and benefits of medication.  Albuterol inhaler also given during visit.  Discussed how to properly use medication.  Follow up in 2 months for reevaluation.  Lengthy discussion had about importance of smoking cessation. ?

## 2022-02-09 NOTE — Progress Notes (Signed)
? ?BP 130/83   Pulse 71   Temp 99.7 ?F (37.6 ?C) (Oral)   SpO2 98%   ? ?Subjective:  ? ? Patient ID: [name], female    DOB: January 10, 1986, 36 y.o.   MRN: [id number] ? ?HPI: ?[name] is a 36 y.o. female ? ?Chief Complaint  ?Patient presents with  ? Wheezing  ? ?UPPER RESPIRATORY TRACT INFECTION ?Worst symptom: a couple days ago ?Fever: yes ?Cough: yes ?Shortness of breath: yes ?Wheezing: yes ?Chest pain: yes ?Chest tightness: no ?Chest congestion: no ?Nasal congestion: yes ?Runny nose: no ?Post nasal drip: yes ?Sneezing: no ?Sore throat: no ?Swollen glands: yes ?Sinus pressure: yes ?Headache: yes ?Face pain: no ?Toothache: no ?Ear pain: yes left ?Ear pressure: yes left ?Eyes red/itching:no ?Eye drainage/crusting: no  ?Vomiting: no ?Rash: no ?Fatigue: no ?Sick contacts: no ?Strep contacts: no  ?Context: worse ?Recurrent sinusitis: no ?Relief with OTC cold/cough medications: no  ?Treatments attempted: none  ? ?COPD ?COPD status: uncontrolled ?Satisfied with current treatment?: no ?Oxygen use: no ?Dyspnea frequency: everyday ?Cough frequency: everyday ?Rescue inhaler frequency:  doesn't have any ?Limitation of activity: no ?Productive cough:  ?Last Spirometry:  ?Pneumovax: Up to Date ?Influenza: Up to Date ? ? ?Relevant past medical, surgical, family and social history reviewed and updated as indicated. Interim medical history since our last visit reviewed. ?Allergies and medications reviewed and updated. ? ?Review of Systems  ?Constitutional:  Positive for fatigue. Negative for fever.  ?HENT:  Positive for congestion, ear pain, postnasal drip, sinus pressure, sinus pain and sore throat. Negative for dental problem, rhinorrhea and sneezing.   ?Respiratory:  Positive for cough, shortness of breath and wheezing. Negative for chest tightness.   ?Cardiovascular:  Positive for chest pain.  ?Gastrointestinal:  Negative for vomiting.  ?Skin:  Negative for rash.  ?Neurological:  Negative for headaches.   ? ?Per HPI unless specifically indicated above ? ?   ?Objective:  ?  ?BP 130/83   Pulse 71   Temp 99.7 ?F (37.6 ?C) (Oral)   SpO2 98%   ?Wt Readings from Last 3 Encounters:  ?02/03/22 239 lb (108.4 kg)  ?02/02/22 237 lb 14.4 oz (107.9 kg)  ?01/28/22 250 lb (113.4 kg)  ?  ?Physical Exam ?Vitals and nursing note reviewed.  ?Constitutional:   ?   General: She is not in acute distress. ?   Appearance: Normal appearance. She is normal weight. She is not ill-appearing, toxic-appearing or diaphoretic.  ?HENT:  ?   Head: Normocephalic.  ?   Right Ear: A middle ear effusion is present.  ?   Left Ear: A middle ear effusion is present.  ?   Nose: Congestion present. No rhinorrhea.  ?   Right Sinus: No maxillary sinus tenderness or frontal sinus tenderness.  ?   Left Sinus: No maxillary sinus tenderness or frontal sinus tenderness.  ?   Mouth/Throat:  ?   Mouth: Mucous membranes are moist.  ?   Pharynx: Oropharynx is clear. Posterior oropharyngeal erythema present. No oropharyngeal exudate.  ?Eyes:  ?   General:     ?   Right eye: No discharge.     ?   Left eye: No discharge.  ?   Extraocular Movements: Extraocular movements intact.  ?   Conjunctiva/sclera: Conjunctivae normal.  ?   Pupils: Pupils are equal, round, and reactive to light.  ?Cardiovascular:  ?   Rate and Rhythm: Normal rate and regular rhythm.  ?   Heart sounds: No murmur heard. ?  Pulmonary:  ?   Effort: Pulmonary effort is normal. No respiratory distress.  ?   Breath sounds: Wheezing present. No rales.  ?Musculoskeletal:  ?   Cervical back: Normal range of motion and neck supple.  ?Skin: ?   General: Skin is warm and dry.  ?   Capillary Refill: Capillary refill takes less than 2 seconds.  ?Neurological:  ?   General: No focal deficit present.  ?   Mental Status: She is alert and oriented to person, place, and time. Mental status is at baseline.  ?Psychiatric:     ?   Mood and Affect: Mood normal.     ?   Behavior: Behavior normal.     ?   Thought Content:  Thought content normal.     ?   Judgment: Judgment normal.  ? ? ?Results for orders placed or performed in visit on 02/02/22  ?Vitamin B12  ?Result Value Ref Range  ? Vitamin B-12 299 180 - 914 pg/mL  ?Folate  ?Result Value Ref Range  ? Folate 10.5 >5.9 ng/mL  ?CBC  ?Result Value Ref Range  ? WBC 6.6 4.0 - 10.5 K/uL  ? RBC 5.17 (H) 3.87 - 5.11 MIL/uL  ? Hemoglobin 10.7 (L) 12.0 - 15.0 g/dL  ? HCT 37.0 36.0 - 46.0 %  ? MCV 71.6 (L) 80.0 - 100.0 fL  ? MCH 20.7 (L) 26.0 - 34.0 pg  ? MCHC 28.9 (L) 30.0 - 36.0 g/dL  ? RDW 20.3 (H) 11.5 - 15.5 %  ? Platelets 204 150 - 400 K/uL  ? nRBC 0.0 0.0 - 0.2 %  ?Ferritin  ?Result Value Ref Range  ? Ferritin 6 (L) 11 - 307 ng/mL  ? ?   ?Assessment & Plan:  ? ?Problem List Items Addressed This Visit   ? ?  ? Respiratory  ? Chronic obstructive pulmonary disease (HCC) - Primary  ?  Chronic. Evaluated via Spirometry.  Has ongoing wheezing, cough and SOB. Will start Spiriva. Discussed how to properly use and side effects and benefits of medication.  Albuterol inhaler also given during visit.  Discussed how to properly use medication.  Follow up in 2 months for reevaluation.  Lengthy discussion had about importance of smoking cessation. ?  ?  ? Relevant Medications  ? budesonide-formoterol (SYMBICORT) 160-4.5 MCG/ACT inhaler  ? albuterol (VENTOLIN HFA) 108 (90 Base) MCG/ACT inhaler  ? ?Other Visit Diagnoses   ? ? Wheezing      ? Relevant Medications  ? albuterol (PROVENTIL) (2.5 MG/3ML) 0.083% nebulizer solution 2.5 mg (Completed)  ? Other Relevant Orders  ? Spirometry with graph (Completed)  ? Viral upper respiratory tract infection      ? Will treat with doxycyline due to recent pneumonia. Complete course of antibiotics. Follow up if symptoms worsen or fail to improve.  ? ?  ?  ? ?Follow up plan: ?Return in about 2 months (around 04/11/2022) for COPD. ? ? ? ? ? ?

## 2022-02-11 ENCOUNTER — Inpatient Hospital Stay: Payer: Medicaid Other

## 2022-02-12 ENCOUNTER — Other Ambulatory Visit: Payer: Medicaid Other

## 2022-02-12 ENCOUNTER — Other Ambulatory Visit: Payer: Self-pay

## 2022-02-12 DIAGNOSIS — N912 Amenorrhea, unspecified: Secondary | ICD-10-CM

## 2022-02-16 ENCOUNTER — Inpatient Hospital Stay: Payer: Medicaid Other

## 2022-02-16 ENCOUNTER — Other Ambulatory Visit: Payer: Self-pay

## 2022-02-16 VITALS — BP 103/61 | HR 70 | Temp 97.1°F | Resp 18

## 2022-02-16 DIAGNOSIS — D509 Iron deficiency anemia, unspecified: Secondary | ICD-10-CM

## 2022-02-16 MED ORDER — SODIUM CHLORIDE 0.9 % IV SOLN
510.0000 mg | INTRAVENOUS | Status: DC
  Administered 2022-02-16: 510 mg via INTRAVENOUS
  Filled 2022-02-16: qty 510

## 2022-02-16 MED ORDER — SODIUM CHLORIDE 0.9 % IV SOLN
Freq: Once | INTRAVENOUS | Status: AC
  Filled 2022-02-16: qty 250

## 2022-02-16 NOTE — Patient Instructions (Signed)

## 2022-02-17 ENCOUNTER — Encounter: Payer: Self-pay | Admitting: Nurse Practitioner

## 2022-02-18 MED ORDER — FLUCONAZOLE 150 MG PO TABS: 150.0000 mg | ORAL_TABLET | Freq: Once | ORAL | 0 refills | Status: AC

## 2022-02-20 LAB — ESTRADIOL: Estradiol: 9 pg/mL

## 2022-02-20 LAB — TESTOSTERONE,FREE AND TOTAL
Testosterone, Free: 0.7 pg/mL (ref 0.0–4.2)
Testosterone: 10 ng/dL (ref 8–60)

## 2022-02-20 LAB — FSH/LH
FSH: 5.9 m[IU]/mL
LH: 2.5 m[IU]/mL

## 2022-02-20 LAB — PROGESTERONE: Progesterone: 0.1 ng/mL

## 2022-02-23 ENCOUNTER — Telehealth: Payer: Self-pay | Admitting: Obstetrics and Gynecology

## 2022-02-23 ENCOUNTER — Ambulatory Visit: Payer: PRIVATE HEALTH INSURANCE | Admitting: Obstetrics and Gynecology

## 2022-02-23 DIAGNOSIS — N912 Amenorrhea, unspecified: Secondary | ICD-10-CM

## 2022-02-23 NOTE — Telephone Encounter (Signed)
Called pt. See telephone note msg. ?

## 2022-02-23 NOTE — Telephone Encounter (Signed)
PT with amenorrhea since 10/22. Used to have HMB with IDA prior to 10/22. Given provera 2/23 without withdrawal bleed. Had normal  labs 3/23. Will check GYN u/s. Pt was on seroquel which has amenorrhea listed as side effect. Pt d/c'd seroquel about 3 wks ago, no period yet.  ?

## 2022-02-24 ENCOUNTER — Encounter: Payer: Self-pay | Admitting: Oncology

## 2022-02-24 NOTE — Telephone Encounter (Signed)
Error

## 2022-03-02 ENCOUNTER — Other Ambulatory Visit: Payer: Self-pay | Admitting: Orthopedic Surgery

## 2022-03-02 DIAGNOSIS — M2241 Chondromalacia patellae, right knee: Secondary | ICD-10-CM

## 2022-03-03 ENCOUNTER — Other Ambulatory Visit: Payer: Self-pay | Admitting: Nurse Practitioner

## 2022-03-03 DIAGNOSIS — J449 Chronic obstructive pulmonary disease, unspecified: Secondary | ICD-10-CM

## 2022-03-04 NOTE — Telephone Encounter (Signed)
Requested Prescriptions  ?Pending Prescriptions Disp Refills  ?? VENTOLIN HFA 108 (90 Base) MCG/ACT inhaler [Pharmacy Med Name: VENTOLIN HFA INH W/DOS CTR 200PUFFS] 18 g 0  ?  Sig: INHALE 2 PUFFS INTO THE LUNGS EVERY 6 HOURS AS NEEDED FOR WHEEZING OR SHORTNESS OF BREATH  ?  ? Pulmonology:  Beta Agonists 2 Passed - 03/03/2022  3:11 AM  ?  ?  Passed - Last BP in normal range  ?  BP Readings from Last 1 Encounters:  ?02/16/22 103/61  ?   ?  ?  Passed - Last Heart Rate in normal range  ?  Pulse Readings from Last 1 Encounters:  ?02/16/22 70  ?   ?  ?  Passed - Valid encounter within last 12 months  ?  Recent Outpatient Visits   ?      ? 3 weeks ago Chronic obstructive pulmonary disease, unspecified COPD type ([place])  ? Murdock, NP  ? 4 weeks ago Acute cough  ? Haskins, NP  ? 1 month ago Borderline personality disorder [facility])  ? Commodore, NP  ? 2 months ago Borderline personality disorder [facility])  ? Tucker, NP  ? 3 months ago Acute cough  ? [facility] [place], Henrine Screws T, NP  ?  ?  ?Future Appointments   ?        ? In 3 weeks Jon Billings, NP [facility]  ? In 1 month Jon Billings, NP [facility], [facility]  ?  ? ?  ?  ?  ? ? ?

## 2022-03-12 ENCOUNTER — Ambulatory Visit: Payer: PRIVATE HEALTH INSURANCE

## 2022-03-12 DIAGNOSIS — N912 Amenorrhea, unspecified: Secondary | ICD-10-CM

## 2022-03-16 ENCOUNTER — Other Ambulatory Visit: Payer: Self-pay

## 2022-03-16 DIAGNOSIS — D509 Iron deficiency anemia, unspecified: Secondary | ICD-10-CM

## 2022-03-17 ENCOUNTER — Inpatient Hospital Stay: Payer: Medicaid Other | Admitting: Oncology

## 2022-03-17 ENCOUNTER — Encounter: Payer: Self-pay | Admitting: Oncology

## 2022-03-17 ENCOUNTER — Inpatient Hospital Stay: Payer: Medicaid Other | Attending: Oncology

## 2022-03-24 NOTE — Progress Notes (Deleted)
There were no vitals taken for this visit.   Subjective:    Patient ID: [name], female    DOB: [birth year], 36 y.o.   MRN: [id number]  HPI: [name] is a 36 y.o. female  No chief complaint on file.  DEPRESSION/ANXIETY Patient states her depression and anxiety were better. She is not taking Seroquel 100mg  and Effexor 37.5mg .  She feels like these are good doses for her.  She saw the OBGYN today who is working to help her get her period under control.      01/19/2022    3:16 PM 12/31/2021    9:05 AM  GAD 7 : Generalized Anxiety Score  Nervous, Anxious, on Edge 0 3  Control/stop worrying 3 3  Worry too much - different things 3 3  Trouble relaxing 3 3  Restless 1 3  Easily annoyed or irritable 3 3  Afraid - awful might happen 0 3  Total GAD 7 Score 13 21  Anxiety Difficulty Not difficult at all Extremely difficult    Flowsheet Row Office Visit from 01/19/2022 in [facility]  PHQ-9 Total Score 5     UPPER RESPIRATORY TRACT INFECTION Worst symptom: has been going on for about a month Fever: no Cough: yes Shortness of breath: yes Wheezing: yes Chest pain: no Chest tightness: no Chest congestion: yes Nasal congestion: yes Runny nose: no Post nasal drip: no Sneezing: no Sore throat: no Swollen glands: yes Sinus pressure: yes Headache: yes Face pain: yes Toothache: no Ear pain: no bilateral Ear pressure: yes "right Eyes red/itching:no Eye drainage/crusting: no  Vomiting: no Rash: no Fatigue: yes Sick contacts: no Strep contacts: no  Context: stable Recurrent sinusitis: no Relief with OTC cold/cough medications: no  Treatments attempted: cold/sinus and cough syrup    Relevant past medical, surgical, family and social history reviewed and updated as indicated. Interim medical history since our last visit reviewed. Allergies and medications reviewed and updated.  Review of Systems  Constitutional:  Positive for fatigue.  Negative for fever.  HENT:  Positive for congestion and sinus pressure. Negative for dental problem, ear pain, postnasal drip, rhinorrhea, sinus pain, sneezing and sore throat.   Respiratory:  Positive for cough, shortness of breath and wheezing.   Cardiovascular:  Positive for chest pain.  Gastrointestinal:  Negative for vomiting.  Skin:  Negative for rash.  Neurological:  Positive for headaches.  Psychiatric/Behavioral:  Positive for dysphoric mood. Negative for suicidal ideas. The patient is nervous/anxious.    Per HPI unless specifically indicated above     Objective:    There were no vitals taken for this visit.  Wt Readings from Last 3 Encounters:  02/03/22 239 lb (108.4 kg)  02/02/22 237 lb 14.4 oz (107.9 kg)  01/28/22 250 lb (113.4 kg)    Physical Exam Vitals and nursing note reviewed.  Constitutional:      General: She is not in acute distress.    Appearance: Normal appearance. She is normal weight. She is not ill-appearing, toxic-appearing or diaphoretic.  HENT:     Head: Normocephalic.     Right Ear: Tympanic membrane and external ear normal.     Left Ear: Tympanic membrane and external ear normal.     Nose: Congestion present.     Mouth/Throat:     Mouth: Mucous membranes are moist.     Pharynx: Oropharynx is clear. Posterior oropharyngeal erythema present. No oropharyngeal exudate.  Eyes:     General:  Right eye: No discharge.        Left eye: No discharge.     Extraocular Movements: Extraocular movements intact.     Conjunctiva/sclera: Conjunctivae normal.     Pupils: Pupils are equal, round, and reactive to light.  Cardiovascular:     Rate and Rhythm: Normal rate and regular rhythm.     Heart sounds: No murmur heard. Pulmonary:     Effort: Pulmonary effort is normal. No respiratory distress.     Breath sounds: Wheezing present. No rales.  Musculoskeletal:     Cervical back: Normal range of motion and neck supple.  Skin:    General: Skin is warm  and dry.     Capillary Refill: Capillary refill takes less than 2 seconds.  Neurological:     General: No focal deficit present.     Mental Status: She is alert and oriented to person, place, and time. Mental status is at baseline.  Psychiatric:        Mood and Affect: Mood normal.        Behavior: Behavior normal.        Thought Content: Thought content normal.        Judgment: Judgment normal.    Results for orders placed or performed in visit on 02/12/22  Estradiol  Result Value Ref Range   Estradiol 9.0 pg/mL  FSH/LH  Result Value Ref Range   LH 2.5 mIU/mL   FSH 5.9 mIU/mL  Progesterone  Result Value Ref Range   Progesterone 0.1 ng/mL  Testosterone,Free and Total  Result Value Ref Range   Testosterone 10 8 - 60 ng/dL   Testosterone, Free 0.7 0.0 - 4.2 pg/mL      Assessment & Plan:   Problem List Items Addressed This Visit      Other   Borderline personality disorder (HCC) - Primary   Depression     Follow up plan: No follow-ups on file.

## 2022-03-25 ENCOUNTER — Ambulatory Visit: Payer: Medicaid Other | Admitting: Nurse Practitioner

## 2022-03-25 DIAGNOSIS — F603 Borderline personality disorder: Secondary | ICD-10-CM

## 2022-03-25 DIAGNOSIS — F32A Depression, unspecified: Secondary | ICD-10-CM

## 2022-03-29 ENCOUNTER — Emergency Department
Admission: EM | Admit: 2022-03-29 | Discharge: 2022-03-29 | Discharge status: Against Medical Advice | Payer: Medicaid Other | Attending: Emergency Medicine | Admitting: Emergency Medicine

## 2022-03-29 ENCOUNTER — Other Ambulatory Visit: Payer: Self-pay

## 2022-03-29 DIAGNOSIS — R4182 Altered mental status, unspecified: Secondary | ICD-10-CM | POA: Insufficient documentation

## 2022-03-29 DIAGNOSIS — Z79899 Other long term (current) drug therapy: Secondary | ICD-10-CM | POA: Diagnosis not present

## 2022-03-29 LAB — CBG MONITORING, ED: Glucose-Capillary: 126 mg/dL — ABNORMAL HIGH (ref 70–99)

## 2022-03-29 LAB — URINE DRUG SCREEN, QUALITATIVE (ARMC ONLY)
Amphetamines, Ur Screen: NOT DETECTED
Barbiturates, Ur Screen: NOT DETECTED
Benzodiazepine, Ur Scrn: POSITIVE — AB
Cannabinoid 50 Ng, Ur ~~LOC~~: NOT DETECTED
Cocaine Metabolite,Ur ~~LOC~~: POSITIVE — AB
MDMA (Ecstasy)Ur Screen: NOT DETECTED
Methadone Scn, Ur: NOT DETECTED
Opiate, Ur Screen: NOT DETECTED
Phencyclidine (PCP) Ur S: NOT DETECTED
Tricyclic, Ur Screen: NOT DETECTED

## 2022-03-29 LAB — COMPREHENSIVE METABOLIC PANEL
ALT: 35 U/L (ref 0–44)
AST: 25 U/L (ref 15–41)
Albumin: 4.3 g/dL (ref 3.5–5.0)
Alkaline Phosphatase: 64 U/L (ref 38–126)
Anion gap: 7 (ref 5–15)
BUN: 7 mg/dL (ref 6–20)
CO2: 24 mmol/L (ref 22–32)
Calcium: 9.2 mg/dL (ref 8.9–10.3)
Chloride: 110 mmol/L (ref 98–111)
Creatinine, Ser: 0.63 mg/dL (ref 0.44–1.00)
GFR, Estimated: >60 mL/min (ref >60)
Glucose, Bld: 124 mg/dL — ABNORMAL HIGH (ref 70–99)
Potassium: 3.9 mmol/L (ref 3.5–5.1)
Sodium: 141 mmol/L (ref 135–145)
Total Bilirubin: 0.6 mg/dL (ref 0.3–1.2)
Total Protein: 7.5 g/dL (ref 6.5–8.1)

## 2022-03-29 LAB — CBC
HCT: 48.4 % — ABNORMAL HIGH (ref 36.0–46.0)
Hemoglobin: 15.2 g/dL — ABNORMAL HIGH (ref 12.0–15.0)
MCH: 25.6 pg — ABNORMAL LOW (ref 26.0–34.0)
MCHC: 31.4 g/dL (ref 30.0–36.0)
MCV: 81.6 fL (ref 80.0–100.0)
Platelets: 254 10*3/uL (ref 150–400)
RBC: 5.93 MIL/uL — ABNORMAL HIGH (ref 3.87–5.11)
RDW: 22.6 % — ABNORMAL HIGH (ref 11.5–15.5)
WBC: 8.4 10*3/uL (ref 4.0–10.5)
nRBC: 0 % (ref 0.0–0.2)

## 2022-03-29 MED ORDER — AMMONIA AROMATIC IN INHA
1.0000 | Freq: Once | RESPIRATORY_TRACT | Status: AC
  Administered 2022-03-29: 1 via RESPIRATORY_TRACT
  Filled 2022-03-29: qty 10

## 2022-03-29 MED ORDER — ONDANSETRON HCL 4 MG/2ML IJ SOLN: 4.0000 mg | INTRAMUSCULAR | Status: DC | PRN

## 2022-03-29 MED ORDER — NALOXONE HCL 2 MG/2ML IJ SOSY
1.0000 mg | PREFILLED_SYRINGE | Freq: Once | INTRAMUSCULAR | Status: AC
  Administered 2022-03-29: 1 mg via INTRAVENOUS

## 2022-03-29 NOTE — ED Notes (Signed)
Pt becoming increasingly harder to keep awake. Pt only opening eyes to painful stimuli now. Dr. Cyril Loosen at bedside pt is being moved into room 25 ? ?

## 2022-03-29 NOTE — ED Notes (Signed)
Pt called friend for a ride ?

## 2022-03-29 NOTE — ED Notes (Signed)
Pt mother called about pt update ?

## 2022-03-29 NOTE — ED Triage Notes (Signed)
Pt to ED via [facility] from her car. Pts mother found pt passed out in her car. Pts mother called 911. Per EMS pt was in her car sleeping with all the windows up. Pt has been very sleepy for them during transport. Pt arrives to the ED sleepy and irritable. Pt VSS and she does not appear to be in any distress.   ?

## 2022-03-29 NOTE — ED Notes (Signed)
Dc ppw provided. Pt declines vs at dc. Pt verbalized consent for dc at this time. Pt denies any questions. Pr ambulatory off unit alert and oriented x4.  ?

## 2022-03-29 NOTE — ED Notes (Signed)
Pt requesting to be dc'd MD aware. Orders in place. Patient calling mother for ride ?

## 2022-03-29 NOTE — ED Provider Notes (Signed)
? ?[facility] ?Provider Note ? ? ? Event Date/Time  ? First MD Initiated Contact with Patient 03/29/22 1742   ?  (approximate) ? ? ?History  ? ?Altered Mental Status ? ? ?HPI ? ?[name] is a 36 y.o. female with a history of borderline personality disorder, drug use, migraines who presents with altered mental status.  EMS reports mother apparently found the patient to sleep in her car and difficult to arouse.  Mother apparently noted that the patient does occasionally use crack cocaine. ?  ? ? ?Physical Exam  ? ?Triage Vital Signs: ?ED Triage Vitals [03/29/22 1745]  ?Enc Vitals Group  ?   BP 134/87  ?   Pulse Rate 63  ?   Resp 16  ?   Temp 97.8 ?F (36.6 ?C)  ?   Temp Source Oral  ?   SpO2 98 %  ?   Weight   ?   Height   ?   Head Circumference   ?   Peak Flow   ?   Pain Score 0  ?   Pain Loc   ?   Pain Edu?   ?   Excl. in GC?   ? ? ?Most recent vital signs: ?Vitals:  ? 03/29/22 2030 03/29/22 2100  ?BP: (!) 151/86 (!) 162/85  ?Pulse: (!) 47 (!) 50  ?Resp: 11 14  ?Temp:    ?SpO2: 98% 97%  ? ? ? ?General: Asleep, arousable to pain but then quickly drifts off to sleep again ?CV:  Good peripheral perfusion.  ?Resp:  Normal effort.  ?Abd:  No distention.  ?Other:  Pupils: Not pinpoint, responsive ? ? ?ED Results / Procedures / Treatments  ? ?Labs ?(all labs ordered are listed, but only abnormal results are displayed) ?Labs Reviewed  ?CBC - Abnormal; Notable for the following components:  ?    Result Value  ? RBC 5.93 (*)   ? Hemoglobin 15.2 (*)   ? HCT 48.4 (*)   ? MCH 25.6 (*)   ? RDW 22.6 (*)   ? All other components within normal limits  ?COMPREHENSIVE METABOLIC PANEL - Abnormal; Notable for the following components:  ? Glucose, Bld 124 (*)   ? All other components within normal limits  ?CBG MONITORING, ED - Abnormal; Notable for the following components:  ? Glucose-Capillary 126 (*)   ? All other components within normal limits  ?URINE DRUG SCREEN, QUALITATIVE ([facility] ONLY)   ? ? ? ?EKG ? ? ? ? ?RADIOLOGY ? ? ? ? ?PROCEDURES: ? ?Critical Care performed: yes ? ?CRITICAL CARE ?Performed by: Jene Every ? ? ?Total critical care time: 30 minutes ? ?Critical care time was exclusive of separately billable procedures and treating other patients. ? ?Critical care was necessary to treat or prevent imminent or life-threatening deterioration. ? ?Critical care was time spent personally by me on the following activities: development of treatment plan with patient and/or surrogate as well as nursing, discussions with consultants, evaluation of patient's response to treatment, examination of patient, obtaining history from patient or surrogate, ordering and performing treatments and interventions, ordering and review of laboratory studies, ordering and review of radiographic studies, pulse oximetry and re-evaluation of patient's condition. ? ? ?.1-3 Lead EKG Interpretation ?Performed by: Jene Every, MD ?Authorized by: Jene Every, MD  ? ?  Interpretation: normal   ?  ECG rate assessment: normal   ?  Rhythm: sinus rhythm   ?  Ectopy: none   ?  Conduction: normal   ? ? ?MEDICATIONS ORDERED IN ED: ?Medications  ?ondansetron (ZOFRAN) injection 4 mg (has no administration in time range)  ?ammonia inhalant 1 each (1 each Inhalation Given 03/29/22 1800)  ?naloxone [facility]) injection 1 mg (1 mg Intravenous Given 03/29/22 1800)  ? ? ? ?IMPRESSION / MDM / ASSESSMENT AND PLAN / ED COURSE  ?I reviewed the triage vital signs and the nursing notes. ? ?Patient presents with altered mental status as detailed above, she is quite drowsy but is arousable.  Suspicious for opioid intoxication however pupils are not pinpoint.  No significant improvement with Narcan. ? ?Patient does arouse rapidly with ammonia inhalant. ? ?We will place on the cardiac monitor, obtain labs, place IV, obtain urine drug screen and monitor carefully. ? ? ?----------------------------------------- ?8:29 PM on  03/29/2022 ?----------------------------------------- ?Patient is sleeping/snoring at this time, remains easily arousable but very drowsy ? ?----------------------------------------- ?9:32 PM on 03/29/2022 ?----------------------------------------- ?Patient is alert and oriented at this time, she feels well and is asking to go home.  She states that she does not know why she was asleep in the car does not want to discuss it further.  She does not want any further testing.  She does have decisional capacity.  She reports she was not trying to hurt herself ? ? ?  ? ? ?FINAL CLINICAL IMPRESSION(S) / ED DIAGNOSES  ? ?Final diagnoses:  ?Altered mental status, unspecified altered mental status type  ? ? ? ?Rx / DC Orders  ? ?ED Discharge Orders   ? ? None  ? ?  ? ? ? ?Note:  This document was prepared using Dragon voice recognition software and may include unintentional dictation errors. ?  ?Jene Every, MD ?03/29/22 2133 ? ?

## 2022-04-10 NOTE — Progress Notes (Deleted)
There were no vitals taken for this visit.   Subjective:    Patient ID: [name], female    DOB: 03-19-1986, 36 y.o.   MRN: [id number]  HPI: [name] is a 36 y.o. female  No chief complaint on file.  COPD COPD status: {Blank single:19197::"controlled","uncontrolled","better","worse","exacerbated","stable"} Satisfied with current treatment?: {Blank single:19197::"yes","no"} Oxygen use: {Blank single:19197::"yes","no"} Dyspnea frequency:  Cough frequency:  Rescue inhaler frequency:   Limitation of activity: {Blank single:19197::"yes","no"} Productive cough:  Last Spirometry:  Pneumovax: {Blank single:19197::"Up to Date","Not up to Date","unknown"} Influenza: {Blank single:19197::"Up to Date","Not up to Date","unknown"}  MOOD  Relevant past medical, surgical, family and social history reviewed and updated as indicated. Interim medical history since our last visit reviewed. Allergies and medications reviewed and updated.  Review of Systems  Per HPI unless specifically indicated above     Objective:    There were no vitals taken for this visit.  Wt Readings from Last 3 Encounters:  02/03/22 239 lb (108.4 kg)  02/02/22 237 lb 14.4 oz (107.9 kg)  01/28/22 250 lb (113.4 kg)    Physical Exam  Results for orders placed or performed during the hospital encounter of 03/29/22  CBC  Result Value Ref Range   WBC 8.4 4.0 - 10.5 K/uL   RBC 5.93 (H) 3.87 - 5.11 MIL/uL   Hemoglobin 15.2 (H) 12.0 - 15.0 g/dL   HCT 76.1 (H) 60.7 - 37.1 %   MCV 81.6 80.0 - 100.0 fL   MCH 25.6 (L) 26.0 - 34.0 pg   MCHC 31.4 30.0 - 36.0 g/dL   RDW 06.2 (H) 69.4 - 85.4 %   Platelets 254 150 - 400 K/uL   nRBC 0.0 0.0 - 0.2 %  Comprehensive metabolic panel  Result Value Ref Range   Sodium 141 135 - 145 mmol/L   Potassium 3.9 3.5 - 5.1 mmol/L   Chloride 110 98 - 111 mmol/L   CO2 24 22 - 32 mmol/L   Glucose, Bld 124 (H) 70 - 99 mg/dL   BUN 7 6 - 20 mg/dL   Creatinine, Ser 6.27  0.44 - 1.00 mg/dL   Calcium 9.2 8.9 - 03.5 mg/dL   Total Protein 7.5 6.5 - 8.1 g/dL   Albumin 4.3 3.5 - 5.0 g/dL   AST 25 15 - 41 U/L   ALT 35 0 - 44 U/L   Alkaline Phosphatase 64 38 - 126 U/L   Total Bilirubin 0.6 0.3 - 1.2 mg/dL   GFR, Estimated >00 >93 mL/min   Anion gap 7 5 - 15  Urine Drug Screen, Qualitative ([facility] only)  Result Value Ref Range   Tricyclic, Ur Screen NONE DETECTED NONE DETECTED   Amphetamines, Ur Screen NONE DETECTED NONE DETECTED   MDMA (Ecstasy)Ur Screen NONE DETECTED NONE DETECTED   Cocaine Metabolite,Ur [place] POSITIVE (A) NONE DETECTED   Opiate, Ur Screen NONE DETECTED NONE DETECTED   Phencyclidine (PCP) Ur S NONE DETECTED NONE DETECTED   Cannabinoid 50 Ng, Ur [place] NONE DETECTED NONE DETECTED   Barbiturates, Ur Screen NONE DETECTED NONE DETECTED   Benzodiazepine, Ur Scrn POSITIVE (A) NONE DETECTED   Methadone Scn, Ur NONE DETECTED NONE DETECTED  CBG monitoring, ED  Result Value Ref Range   Glucose-Capillary 126 (H) 70 - 99 mg/dL      Assessment & Plan:   Problem List Items Addressed This Visit       Respiratory   Chronic obstructive pulmonary disease (HCC) - Primary     Follow up  plan: No follow-ups on file.

## 2022-04-13 ENCOUNTER — Ambulatory Visit: Payer: Medicaid Other | Admitting: Nurse Practitioner

## 2022-04-13 DIAGNOSIS — J449 Chronic obstructive pulmonary disease, unspecified: Secondary | ICD-10-CM

## 2022-04-20 NOTE — Telephone Encounter (Signed)
Contacted patient via phone. Left voicemail for patient to call back to be scheduled 

## 2022-08-05 HISTORY — PX: KNEE SURGERY: SHX244

## 2023-01-12 ENCOUNTER — Encounter: Payer: Self-pay | Admitting: Oncology

## 2023-01-13 ENCOUNTER — Ambulatory Visit (INDEPENDENT_AMBULATORY_CARE_PROVIDER_SITE_OTHER): Payer: Medicaid Other | Admitting: Nurse Practitioner

## 2023-01-13 ENCOUNTER — Encounter: Payer: Self-pay | Admitting: Nurse Practitioner

## 2023-01-13 VITALS — BP 122/75 | HR 76 | Temp 97.9°F | Wt 228.7 lb

## 2023-01-13 DIAGNOSIS — Z23 Encounter for immunization: Secondary | ICD-10-CM | POA: Diagnosis not present

## 2023-01-13 DIAGNOSIS — J449 Chronic obstructive pulmonary disease, unspecified: Secondary | ICD-10-CM | POA: Diagnosis not present

## 2023-01-13 DIAGNOSIS — F603 Borderline personality disorder: Secondary | ICD-10-CM | POA: Diagnosis not present

## 2023-01-13 MED ORDER — VENLAFAXINE HCL ER 75 MG PO CP24: 75.0000 mg | ORAL_CAPSULE | Freq: Every day | ORAL | 1 refills | Status: DC

## 2023-01-13 NOTE — Progress Notes (Signed)
BP 122/75   Pulse 76   Temp 97.9 F (36.6 C) (Oral)   Wt 228 lb 11.2 oz (103.7 kg)   BMI 39.26 kg/m    Subjective:    Patient ID: [name], female    DOB: 1986-10-26, 37 y.o.   MRN: [id number]  HPI: [name] is a 37 y.o. female  Chief Complaint  Patient presents with   Anxiety   Depression   COPD    COPD Patient feels like her breathing has been better.  She has been smoking 4 cigarettes per month. She is vaping.   COPD status: controlled Satisfied with current treatment?: yes Oxygen use: no Dyspnea frequency: none Cough frequency: none Rescue inhaler frequency:   Limitation of activity: {Blank single:19197::"yes","no"} Productive cough:  Last Spirometry:  Pneumovax: {Blank single:19197::"Up to Date","Not up to Date","unknown"} Influenza: {Blank single:19197::"Up to Date","Not up to Date","unknown"}    Relevant past medical, surgical, family and social history reviewed and updated as indicated. Interim medical history since our last visit reviewed. Allergies and medications reviewed and updated.  Review of Systems  Per HPI unless specifically indicated above     Objective:    BP 122/75   Pulse 76   Temp 97.9 F (36.6 C) (Oral)   Wt 228 lb 11.2 oz (103.7 kg)   BMI 39.26 kg/m   Wt Readings from Last 3 Encounters:  01/13/23 228 lb 11.2 oz (103.7 kg)  02/03/22 239 lb (108.4 kg)  02/02/22 237 lb 14.4 oz (107.9 kg)    Physical Exam  Results for orders placed or performed during the hospital encounter of 03/29/22  CBC  Result Value Ref Range   WBC 8.4 4.0 - 10.5 K/uL   RBC 5.93 (H) 3.87 - 5.11 MIL/uL   Hemoglobin 15.2 (H) 12.0 - 15.0 g/dL   HCT 48.4 (H) 36.0 - 46.0 %   MCV 81.6 80.0 - 100.0 fL   MCH 25.6 (L) 26.0 - 34.0 pg   MCHC 31.4 30.0 - 36.0 g/dL   RDW 22.6 (H) 11.5 - 15.5 %   Platelets 254 150 - 400 K/uL   nRBC 0.0 0.0 - 0.2 %  Comprehensive metabolic panel  Result Value Ref Range   Sodium 141 135 - 145 mmol/L    Potassium 3.9 3.5 - 5.1 mmol/L   Chloride 110 98 - 111 mmol/L   CO2 24 22 - 32 mmol/L   Glucose, Bld 124 (H) 70 - 99 mg/dL   BUN 7 6 - 20 mg/dL   Creatinine, Ser 0.63 0.44 - 1.00 mg/dL   Calcium 9.2 8.9 - 10.3 mg/dL   Total Protein 7.5 6.5 - 8.1 g/dL   Albumin 4.3 3.5 - 5.0 g/dL   AST 25 15 - 41 U/L   ALT 35 0 - 44 U/L   Alkaline Phosphatase 64 38 - 126 U/L   Total Bilirubin 0.6 0.3 - 1.2 mg/dL   GFR, Estimated >60 >60 mL/min   Anion gap 7 5 - 15  Urine Drug Screen, Qualitative ([facility] only)  Result Value Ref Range   Tricyclic, Ur Screen NONE DETECTED NONE DETECTED   Amphetamines, Ur Screen NONE DETECTED NONE DETECTED   MDMA (Ecstasy)Ur Screen NONE DETECTED NONE DETECTED   Cocaine Metabolite,Ur [place] POSITIVE (A) NONE DETECTED   Opiate, Ur Screen NONE DETECTED NONE DETECTED   Phencyclidine (PCP) Ur S NONE DETECTED NONE DETECTED   Cannabinoid 50 Ng, Ur [place] NONE DETECTED NONE DETECTED   Barbiturates, Ur Screen NONE DETECTED NONE  DETECTED   Benzodiazepine, Ur Scrn POSITIVE (A) NONE DETECTED   Methadone Scn, Ur NONE DETECTED NONE DETECTED  CBG monitoring, ED  Result Value Ref Range   Glucose-Capillary 126 (H) 70 - 99 mg/dL      Assessment & Plan:   Problem List Items Addressed This Visit   None Visit Diagnoses     Need for influenza vaccination    -  Primary   Relevant Orders   Flu Vaccine QUAD 17mo+IM (Fluarix, Fluzone & Alfiuria Quad PF)        Follow up plan: No follow-ups on file.

## 2023-01-14 NOTE — Assessment & Plan Note (Signed)
Chronic.  Well controlled.  Continue to follow up with psychiatry. 

## 2023-01-14 NOTE — Assessment & Plan Note (Signed)
Chronic.  Controlled without medication.  Declines starting maintenance inhaler.  Does not need refill on Albuterol.  Encouraged 100% smoking cessation.  Return to clinic in 3 months for reevaluation.  Call sooner if concerns arise.

## 2023-02-04 ENCOUNTER — Encounter: Payer: Self-pay | Admitting: Radiology

## 2023-02-15 ENCOUNTER — Encounter: Payer: Self-pay | Admitting: Nurse Practitioner

## 2023-04-13 ENCOUNTER — Encounter: Payer: Medicaid Other | Admitting: Nurse Practitioner

## 2023-04-13 NOTE — Progress Notes (Deleted)
There were no vitals taken for this visit.   Subjective:    Patient ID: [name], female    DOB: Nov 10, 1986, 37 y.o.   MRN: [id number]  HPI: [name] is a 37 y.o. female presenting on 04/13/2023 for comprehensive medical examination. Current medical complaints include:{Blank single:19197::"none","***"}  She currently lives with: Menopausal Symptoms: {Blank single:19197::"yes","no"}  COPD COPD status: {Blank single:19197::"controlled","uncontrolled","better","worse","exacerbated","stable"} Satisfied with current treatment?: {Blank single:19197::"yes","no"} Oxygen use: {Blank single:19197::"yes","no"} Dyspnea frequency:  Cough frequency:  Rescue inhaler frequency:   Limitation of activity: {Blank single:19197::"yes","no"} Productive cough:  Last Spirometry:  Pneumovax: {Blank single:19197::"Up to Date","Not up to Date","unknown"} Influenza: {Blank single:19197::"Up to Date","Not up to Date","unknown"}  MOOD  Depression Screen done today and results listed below:     01/13/2023    4:34 PM 01/19/2022    3:15 PM 12/31/2021    9:05 AM 08/05/2021   10:46 AM 06/26/2021    1:12 PM  Depression screen PHQ 2/9  Decreased Interest 0 0 3 0 0  Down, Depressed, Hopeless 0 0 3 0 3  PHQ - 2 Score 0 0 6 0 3  Altered sleeping 3 0 3 3 3   Tired, decreased energy 0 3 3 0 3  Change in appetite 0 1 3 0 3  Feeling bad or failure about yourself  0 0 3 0 0  Trouble concentrating 0 1 3 3 3   Moving slowly or fidgety/restless 0 0 0 0 0  Suicidal thoughts 0 0 0 0 0  PHQ-9 Score 3 5 21 6 15   Difficult doing work/chores Very difficult Not difficult at all Extremely dIfficult Not difficult at all Not difficult at all    The patient {has/does not have:19849} a history of falls. I {did/did not:19850} complete a risk assessment for falls. A plan of care for falls {was/was not:19852} documented.   Past Medical History:  Past Medical History:  Diagnosis Date   Borderline personality  disorder (HCC) 04/16/2013   Cannabis abuse 2014   Chondromalacia patellae, right knee    Depression    Well-controlled with Zoloft   Gestational diabetes    Has prediabetes now.   Headache    Heroin abuse (HCC)    History of drug use 12/15/2017   IDA (iron deficiency anemia)    Migraines    Morbid obesity with BMI of 40.0-44.9, adult (HCC)    Nicotine dependence with current use 06/26/2021   Osteoarthritis, knee     Surgical History:  Past Surgical History:  Procedure Laterality Date   ANKLE SURGERY Left 12/07/2010   CESAREAN SECTION  12/07/2009   CESAREAN SECTION WITH BILATERAL TUBAL LIGATION Bilateral 06/15/2018   Procedure: CESAREAN SECTION WITH BILATERAL TUBAL LIGATION;  Surgeon: Suzy Bouchard, MD;  Location: [facility] ORS;  Service: Obstetrics;  Laterality: Bilateral;   CHOLECYSTECTOMY N/A 09/27/2016   Procedure: LAPAROSCOPIC CHOLECYSTECTOMY WITH INTRAOPERATIVE CHOLANGIOGRAM;  Surgeon: Lattie Haw, MD;  Location: [facility] OR[facility];  Service: General;  Laterality: N/A;   DILATION AND CURETTAGE OF UTERUS  12/08/2011   KNEE SURGERY Right 08/05/2022   TONSILLECTOMY  12/07/2005    Medications:  Current Outpatient Medications on File Prior to Visit  Medication Sig   baclofen (LIORESAL) 20 MG tablet Take 20 mg by mouth 2 (two) times daily.   Buprenorphine ER (SUBLOCADE) 300 MG/1.5ML SOSY    lamoTRIgine (LAMICTAL) 25 MG tablet Take 2 tablets by mouth daily.   venlafaxine XR (EFFEXOR-XR) 75 MG 24 hr capsule Take 1 capsule (75 mg total) by mouth  daily with breakfast.   VYVANSE 40 MG capsule Take 40 mg by mouth daily.   No current facility-administered medications on file prior to visit.    Allergies:  Allergies  Allergen Reactions   Clindamycin/Lincomycin Anaphylaxis and Rash   Gabapentin Swelling    Leg swelling   Amoxicillin Rash    Has patient had a PCN reaction causing immediate rash, facial/tongue/throat swelling, SOB or lightheadedness with hypotension: Yes Has  patient had a PCN reaction causing severe rash involving mucus membranes or skin necrosis: No Has patient had a PCN reaction that required hospitalization: No Has patient had a PCN reaction occurring within the last 10 years: No If all of the above answers are "NO", then may proceed with Cephalosporin use.     Social History:  Social History   Socioeconomic History   Marital status: Divorced    Spouse name: Not on file   Number of children: 2   Years of education: Not on file   Highest education level: Not on file  Occupational History    Employer: [employer]  Tobacco Use   Smoking status: Former    Packs/day: 1.00    Years: 13.00    Additional pack years: 0.00    Total pack years: 13.00    Types: Cigarettes   Smokeless tobacco: Never  Vaping Use   Vaping Use: Every day  Substance and Sexual Activity   Alcohol use: No   Drug use: Not Currently    Comment: history of heroin and marijuana use   Sexual activity: Not Currently    Birth control/protection: Surgical    Comment: Tubal Ligation  Other Topics Concern   Not on file  Social History Narrative   Divorced mother of 2 (76 & 3 - as of 09/2021)   Works @ [employer]   NO routine exercise -- active @ work   Smokes 1 PPD - not interested in quitting.   Drinks "lots" of coffee (drinks cold coffee most of the day)   Social Determinants of Health   Financial Resource Strain: Not on file  Food Insecurity: Not on file  Transportation Needs: Not on file  Physical Activity: Not on file  Stress: Not on file  Social Connections: Not on file  Intimate Partner Violence: Not on file   Social History   Tobacco Use  Smoking Status Former   Packs/day: 1.00   Years: 13.00   Additional pack years: 0.00   Total pack years: 13.00   Types: Cigarettes  Smokeless Tobacco Never   Social History   Substance and Sexual Activity  Alcohol Use No    Family History:  Family History  Problem Relation Age of Onset    Arthritis Mother    Cancer Mother        skin   Hyperlipidemia Mother        takes meds   Coronary artery disease Mother        non-obstructive   Cancer Father        Lung   Anxiety disorder Sister    Depression Sister    ADD / ADHD Son    Heart attack Maternal Grandfather 32   Coronary artery disease Maternal Grandfather 60       s/p CABG    Past medical history, surgical history, medications, allergies, family history and social history reviewed with patient today and changes made to appropriate areas of the chart.   ROS All other ROS negative except what is listed above  and in the HPI.      Objective:    There were no vitals taken for this visit.  Wt Readings from Last 3 Encounters:  01/13/23 228 lb 11.2 oz (103.7 kg)  02/03/22 239 lb (108.4 kg)  02/02/22 237 lb 14.4 oz (107.9 kg)    Physical Exam  Results for orders placed or performed during the hospital encounter of 03/29/22  CBC  Result Value Ref Range   WBC 8.4 4.0 - 10.5 K/uL   RBC 5.93 (H) 3.87 - 5.11 MIL/uL   Hemoglobin 15.2 (H) 12.0 - 15.0 g/dL   HCT 16.1 (H) 09.6 - 04.5 %   MCV 81.6 80.0 - 100.0 fL   MCH 25.6 (L) 26.0 - 34.0 pg   MCHC 31.4 30.0 - 36.0 g/dL   RDW 40.9 (H) 81.1 - 91.4 %   Platelets 254 150 - 400 K/uL   nRBC 0.0 0.0 - 0.2 %  Comprehensive metabolic panel  Result Value Ref Range   Sodium 141 135 - 145 mmol/L   Potassium 3.9 3.5 - 5.1 mmol/L   Chloride 110 98 - 111 mmol/L   CO2 24 22 - 32 mmol/L   Glucose, Bld 124 (H) 70 - 99 mg/dL   BUN 7 6 - 20 mg/dL   Creatinine, Ser 7.82 0.44 - 1.00 mg/dL   Calcium 9.2 8.9 - 95.6 mg/dL   Total Protein 7.5 6.5 - 8.1 g/dL   Albumin 4.3 3.5 - 5.0 g/dL   AST 25 15 - 41 U/L   ALT 35 0 - 44 U/L   Alkaline Phosphatase 64 38 - 126 U/L   Total Bilirubin 0.6 0.3 - 1.2 mg/dL   GFR, Estimated >21 >30 mL/min   Anion gap 7 5 - 15  Urine Drug Screen, Qualitative ([facility] only)  Result Value Ref Range   Tricyclic, Ur Screen NONE DETECTED NONE DETECTED    Amphetamines, Ur Screen NONE DETECTED NONE DETECTED   MDMA (Ecstasy)Ur Screen NONE DETECTED NONE DETECTED   Cocaine Metabolite,Ur [place] POSITIVE (A) NONE DETECTED   Opiate, Ur Screen NONE DETECTED NONE DETECTED   Phencyclidine (PCP) Ur S NONE DETECTED NONE DETECTED   Cannabinoid 50 Ng, Ur [place] NONE DETECTED NONE DETECTED   Barbiturates, Ur Screen NONE DETECTED NONE DETECTED   Benzodiazepine, Ur Scrn POSITIVE (A) NONE DETECTED   Methadone Scn, Ur NONE DETECTED NONE DETECTED  CBG monitoring, ED  Result Value Ref Range   Glucose-Capillary 126 (H) 70 - 99 mg/dL      Assessment & Plan:   Problem List Items Addressed This Visit       Respiratory   Chronic obstructive pulmonary disease (HCC) - Primary     Other   Borderline personality disorder (HCC)   History of drug use   Other Visit Diagnoses     Annual physical exam       Screening for ischemic heart disease            Follow up plan: No follow-ups on file.   LABORATORY TESTING:  - Pap smear: {Blank single:19197::"pap done","not applicable","up to date","done elsewhere"}  IMMUNIZATIONS:   - Tdap: Tetanus vaccination status reviewed: {tetanus status:315746}. - Influenza: {Blank single:19197::"Up to date","Administered today","Postponed to flu season","Refused","Given elsewhere"} - Pneumovax: {Blank single:19197::"Up to date","Administered today","Not applicable","Refused","Given elsewhere"} - Prevnar: {Blank single:19197::"Up to date","Administered today","Not applicable","Refused","Given elsewhere"} - COVID: {Blank single:19197::"Up to date","Administered today","Not applicable","Refused","Given elsewhere"} - HPV: {Blank single:19197::"Up to date","Administered today","Not applicable","Refused","Given elsewhere"} - Shingrix vaccine: {Blank single:19197::"Up to date","Administered today","Not applicable","Refused","Given elsewhere"}  SCREENING: -Mammogram: {Blank single:19197::"Up to date","Ordered today","Not  applicable","Refused","Done elsewhere"}  - Colonoscopy: {Blank single:19197::"Up to date","Ordered today","Not applicable","Refused","Done elsewhere"}  - Bone Density: {Blank single:19197::"Up to date","Ordered today","Not applicable","Refused","Done elsewhere"}  -Hearing Test: {Blank single:19197::"Up to date","Ordered today","Not applicable","Refused","Done elsewhere"}  -Spirometry: {Blank single:19197::"Up to date","Ordered today","Not applicable","Refused","Done elsewhere"}   PATIENT COUNSELING:   Advised to take 1 mg of folate supplement per day if capable of pregnancy.   Sexuality: Discussed sexually transmitted diseases, partner selection, use of condoms, avoidance of unintended pregnancy  and contraceptive alternatives.   Advised to avoid cigarette smoking.  I discussed with the patient that most people either abstain from alcohol or drink within safe limits (<=14/week and <=4 drinks/occasion for males, <=7/weeks and <= 3 drinks/occasion for females) and that the risk for alcohol disorders and other health effects rises proportionally with the number of drinks per week and how often a drinker exceeds daily limits.  Discussed cessation/primary prevention of drug use and availability of treatment for abuse.   Diet: Encouraged to adjust caloric intake to maintain  or achieve ideal body weight, to reduce intake of dietary saturated fat and total fat, to limit sodium intake by avoiding high sodium foods and not adding table salt, and to maintain adequate dietary potassium and calcium preferably from fresh fruits, vegetables, and low-fat dairy products.    stressed the importance of regular exercise  Injury prevention: Discussed safety belts, safety helmets, smoke detector, smoking near bedding or upholstery.   Dental health: Discussed importance of regular tooth brushing, flossing, and dental visits.    NEXT PREVENTATIVE PHYSICAL DUE IN 1 YEAR. No follow-ups on file.

## 2023-05-04 ENCOUNTER — Encounter: Payer: Self-pay | Admitting: Nurse Practitioner

## 2023-05-04 ENCOUNTER — Ambulatory Visit (INDEPENDENT_AMBULATORY_CARE_PROVIDER_SITE_OTHER): Payer: Medicaid Other | Admitting: Nurse Practitioner

## 2023-05-04 VITALS — BP 134/88 | HR 57 | Temp 98.1°F | Wt 231.8 lb

## 2023-05-04 DIAGNOSIS — F909 Attention-deficit hyperactivity disorder, unspecified type: Secondary | ICD-10-CM | POA: Diagnosis not present

## 2023-05-04 NOTE — Progress Notes (Signed)
BP 134/88   Pulse (!) 57   Temp 98.1 F (36.7 C) (Oral)   Wt 231 lb 12.8 oz (105.1 kg)   LMP 03/24/2023 (Approximate)   SpO2 97%   BMI 39.79 kg/m    Subjective:    Patient ID: [name], female    DOB: [birth year], 37 y.o.   MRN: [id number]  HPI: [name] is a 37 y.o. female  Chief Complaint  Patient presents with   ADHD    Wants to discuss medications    ADHD Patient presents to clinic to discuss ADHD.  She was getting it from a psychiatrist.  States the psychiatrist she was seeing before quit and she hasn't been able to see someone else.    Relevant past medical, surgical, family and social history reviewed and updated as indicated. Interim medical history since our last visit reviewed. Allergies and medications reviewed and updated.  Review of Systems  Psychiatric/Behavioral:  Positive for behavioral problems.     Per HPI unless specifically indicated above     Objective:    BP 134/88   Pulse (!) 57   Temp 98.1 F (36.7 C) (Oral)   Wt 231 lb 12.8 oz (105.1 kg)   LMP 03/24/2023 (Approximate)   SpO2 97%   BMI 39.79 kg/m   Wt Readings from Last 3 Encounters:  05/04/23 231 lb 12.8 oz (105.1 kg)  01/13/23 228 lb 11.2 oz (103.7 kg)  02/03/22 239 lb (108.4 kg)    Physical Exam Vitals and nursing note reviewed.  Constitutional:      General: She is not in acute distress.    Appearance: Normal appearance. She is normal weight. She is not ill-appearing, toxic-appearing or diaphoretic.  HENT:     Head: Normocephalic.     Right Ear: External ear normal.     Left Ear: External ear normal.     Nose: Nose normal.     Mouth/Throat:     Mouth: Mucous membranes are moist.     Pharynx: Oropharynx is clear.  Eyes:     General:        Right eye: No discharge.        Left eye: No discharge.     Extraocular Movements: Extraocular movements intact.     Conjunctiva/sclera: Conjunctivae normal.     Pupils: Pupils are equal, round, and reactive to  light.  Cardiovascular:     Rate and Rhythm: Normal rate and regular rhythm.     Heart sounds: No murmur heard. Pulmonary:     Effort: Pulmonary effort is normal. No respiratory distress.     Breath sounds: Normal breath sounds. No wheezing or rales.  Musculoskeletal:     Cervical back: Normal range of motion and neck supple.  Skin:    General: Skin is warm and dry.     Capillary Refill: Capillary refill takes less than 2 seconds.  Neurological:     General: No focal deficit present.     Mental Status: She is alert and oriented to person, place, and time. Mental status is at baseline.  Psychiatric:        Mood and Affect: Mood normal.        Behavior: Behavior normal.        Thought Content: Thought content normal.        Judgment: Judgment normal.     Results for orders placed or performed during the hospital encounter of 03/29/22  CBC  Result Value Ref Range  WBC 8.4 4.0 - 10.5 K/uL   RBC 5.93 (H) 3.87 - 5.11 MIL/uL   Hemoglobin 15.2 (H) 12.0 - 15.0 g/dL   HCT 81.1 (H) 91.4 - 78.2 %   MCV 81.6 80.0 - 100.0 fL   MCH 25.6 (L) 26.0 - 34.0 pg   MCHC 31.4 30.0 - 36.0 g/dL   RDW 95.6 (H) 21.3 - 08.6 %   Platelets 254 150 - 400 K/uL   nRBC 0.0 0.0 - 0.2 %  Comprehensive metabolic panel  Result Value Ref Range   Sodium 141 135 - 145 mmol/L   Potassium 3.9 3.5 - 5.1 mmol/L   Chloride 110 98 - 111 mmol/L   CO2 24 22 - 32 mmol/L   Glucose, Bld 124 (H) 70 - 99 mg/dL   BUN 7 6 - 20 mg/dL   Creatinine, Ser 5.78 0.44 - 1.00 mg/dL   Calcium 9.2 8.9 - 46.9 mg/dL   Total Protein 7.5 6.5 - 8.1 g/dL   Albumin 4.3 3.5 - 5.0 g/dL   AST 25 15 - 41 U/L   ALT 35 0 - 44 U/L   Alkaline Phosphatase 64 38 - 126 U/L   Total Bilirubin 0.6 0.3 - 1.2 mg/dL   GFR, Estimated >62 >95 mL/min   Anion gap 7 5 - 15  Urine Drug Screen, Qualitative ([facility] only)  Result Value Ref Range   Tricyclic, Ur Screen NONE DETECTED NONE DETECTED   Amphetamines, Ur Screen NONE DETECTED NONE DETECTED   MDMA  (Ecstasy)Ur Screen NONE DETECTED NONE DETECTED   Cocaine Metabolite,Ur [place] POSITIVE (A) NONE DETECTED   Opiate, Ur Screen NONE DETECTED NONE DETECTED   Phencyclidine (PCP) Ur S NONE DETECTED NONE DETECTED   Cannabinoid 50 Ng, Ur [place] NONE DETECTED NONE DETECTED   Barbiturates, Ur Screen NONE DETECTED NONE DETECTED   Benzodiazepine, Ur Scrn POSITIVE (A) NONE DETECTED   Methadone Scn, Ur NONE DETECTED NONE DETECTED  CBG monitoring, ED  Result Value Ref Range   Glucose-Capillary 126 (H) 70 - 99 mg/dL      Assessment & Plan:   Problem List Items Addressed This Visit   None Visit Diagnoses     Attention deficit hyperactivity disorder (ADHD), unspecified ADHD type    -  Primary   New referral placed for psychiatry for patient during visit.   Relevant Orders   Ambulatory referral to Psychiatry        Follow up plan: Return in about 1 month (around 06/04/2023) for Physical and Fasting labs.

## 2023-06-04 ENCOUNTER — Encounter: Payer: Medicaid Other | Admitting: Physician Assistant

## 2023-06-14 ENCOUNTER — Encounter: Payer: Self-pay | Admitting: Nurse Practitioner

## 2023-08-29 IMAGING — CT CT ABD-PELV W/O CM
2 of 4 series · 17 of 46 positions shown, 19 images · non-contrast
Comparison: 09/28/2011

CLINICAL DATA: Chronic left lower quadrant abdominal pain

EXAM:
CT ABDOMEN AND PELVIS WITHOUT CONTRAST
TECHNIQUE: Multidetector CT imaging of the abdomen and pelvis was performed
following the standard protocol without IV contrast.

[Series 2: routine abd/pel wo · axial · 0.83mm/px · z∈[-893,-473]mm · 14 of 93 slices shown, 16 images]
[im 5/93  soft-tissue]
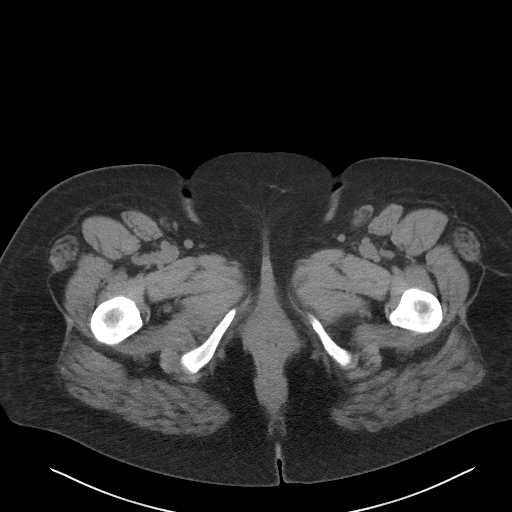
[im 5/93  bone]
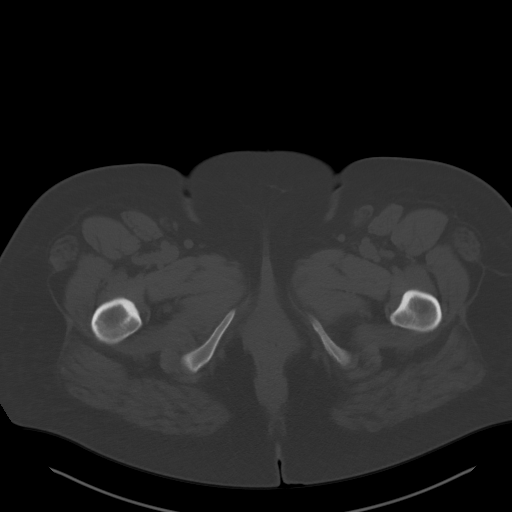
[im 13/93  soft-tissue]
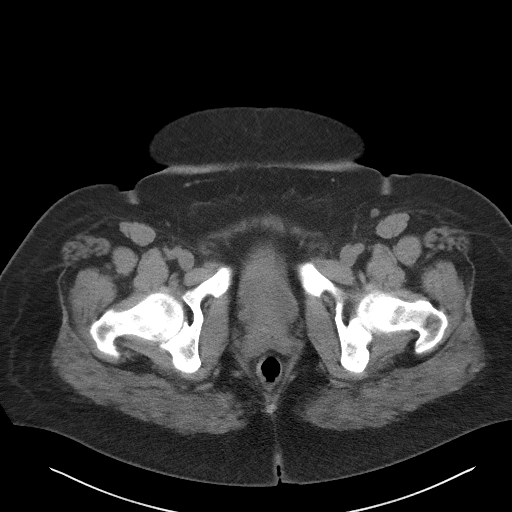
[im 17/93  soft-tissue]
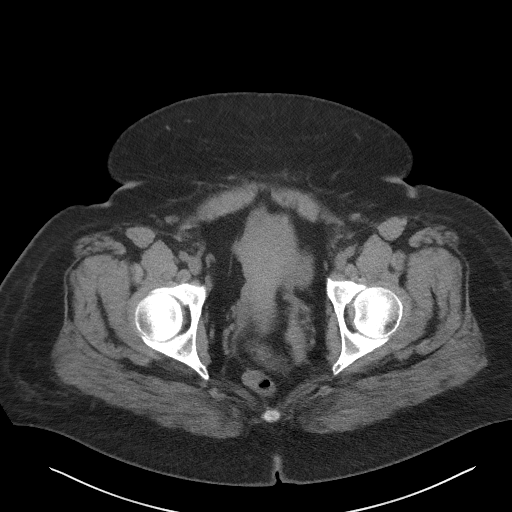
[im 25/93  soft-tissue]
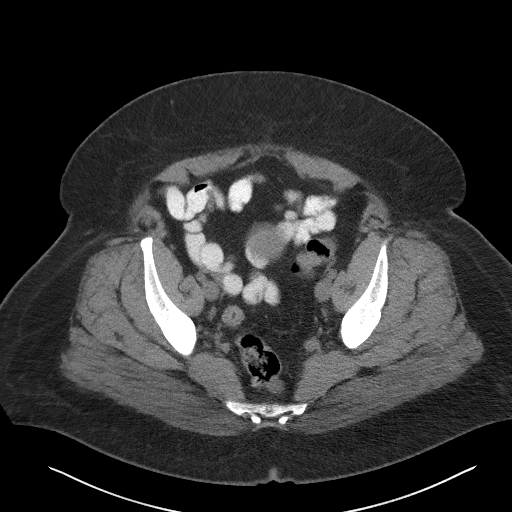
[im 33/93  soft-tissue]
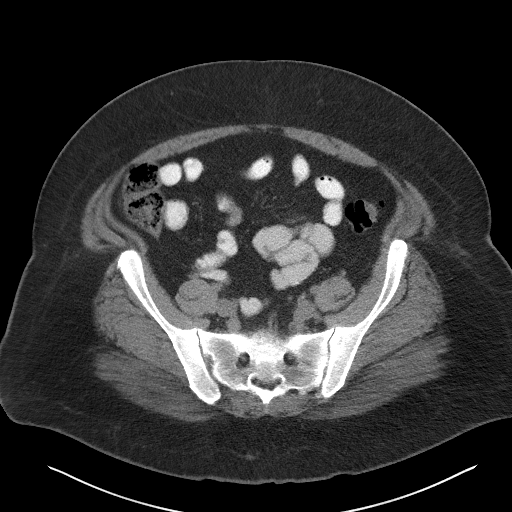
[im 37/93  soft-tissue]
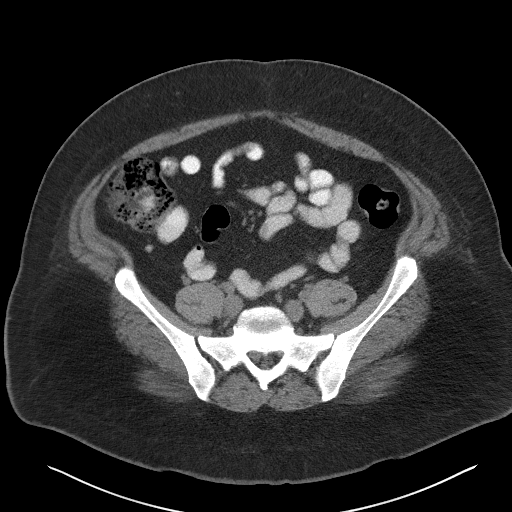
[im 45/93  soft-tissue]
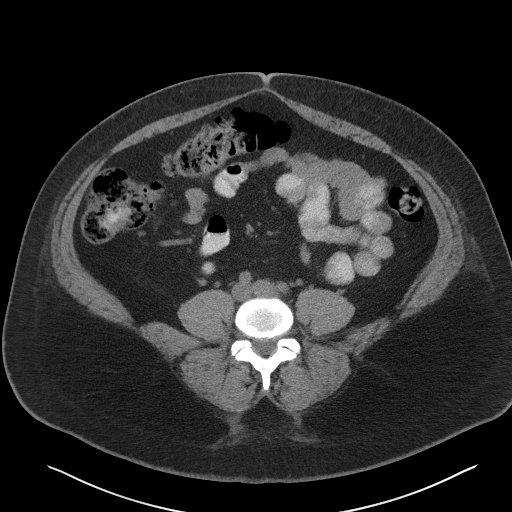
[im 49/93  soft-tissue]
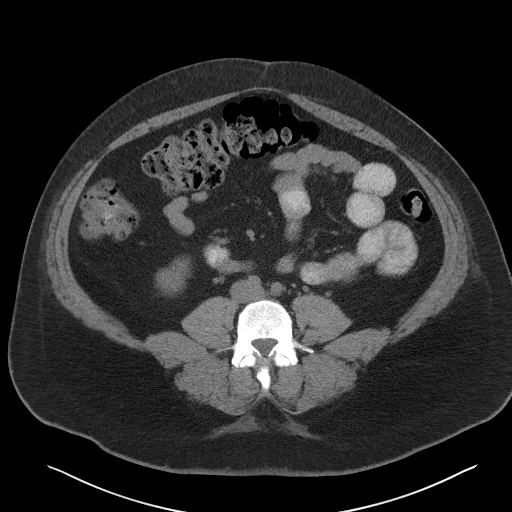
[im 57/93  soft-tissue]
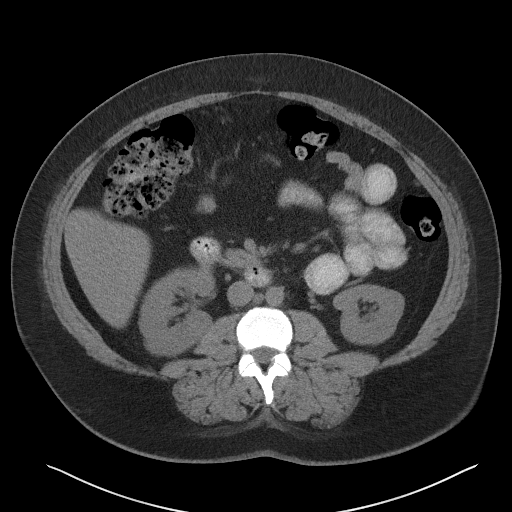
[im 57/93  bone]
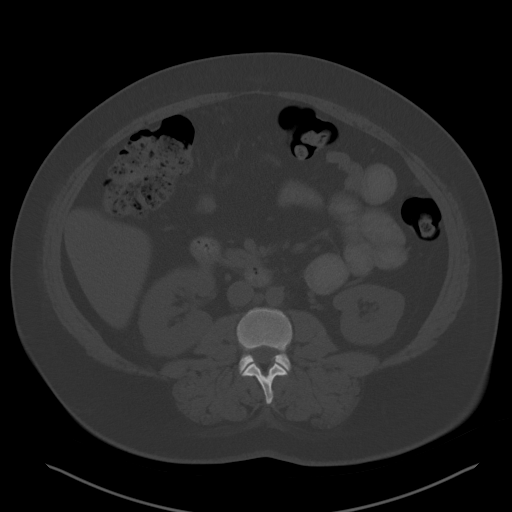
[im 61/93  soft-tissue]
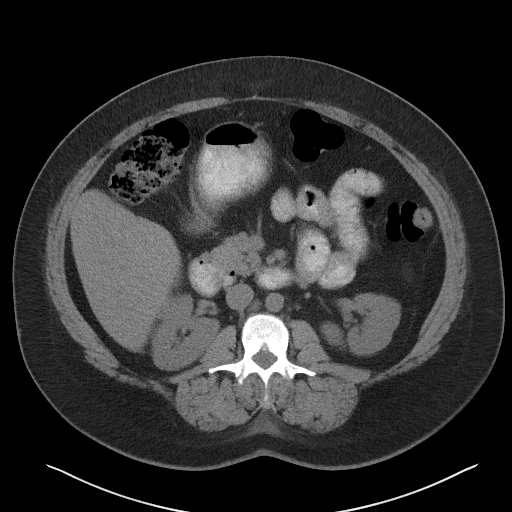
[im 69/93  soft-tissue]
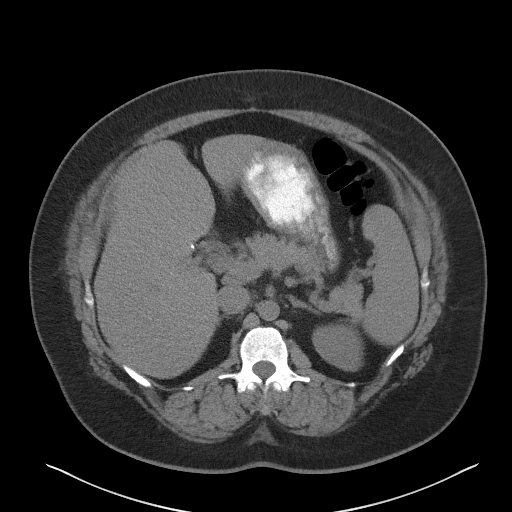
[im 77/93  soft-tissue]
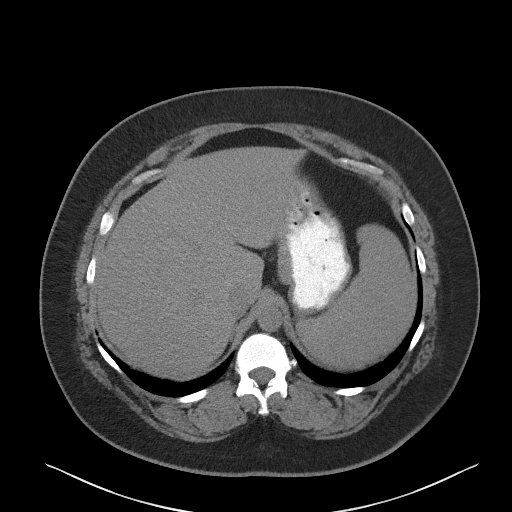
[im 81/93  soft-tissue]
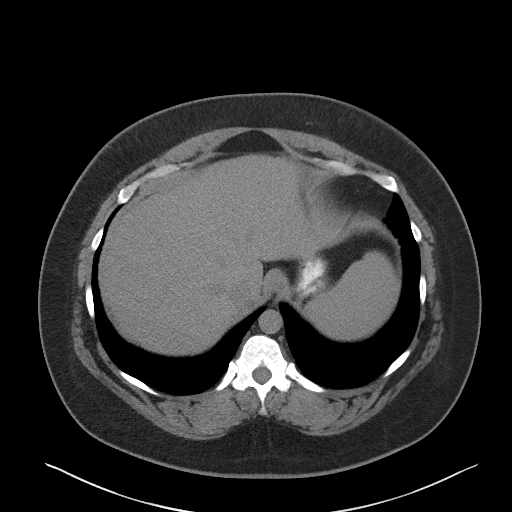
[im 89/93  soft-tissue]
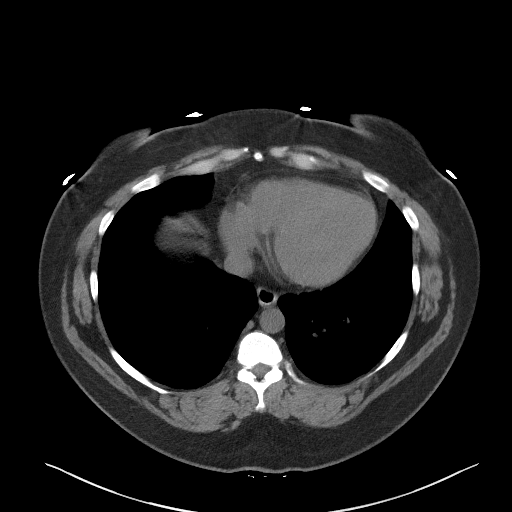

[Series 5: coronal st · coronal · 0.91mm/px · 3 of 121 slices shown]
[im 41/121  soft-tissue]
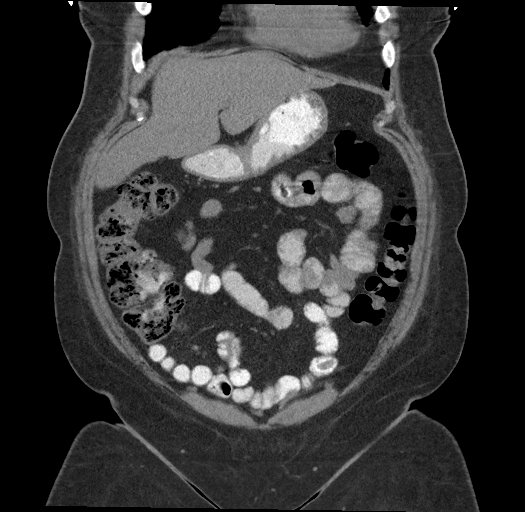
[im 54/121  soft-tissue]
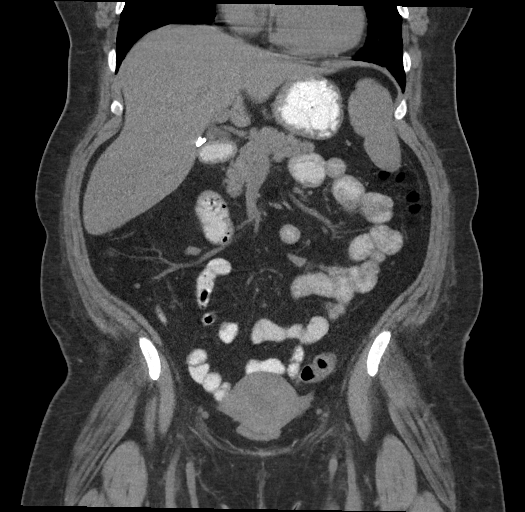
[im 67/121  soft-tissue]
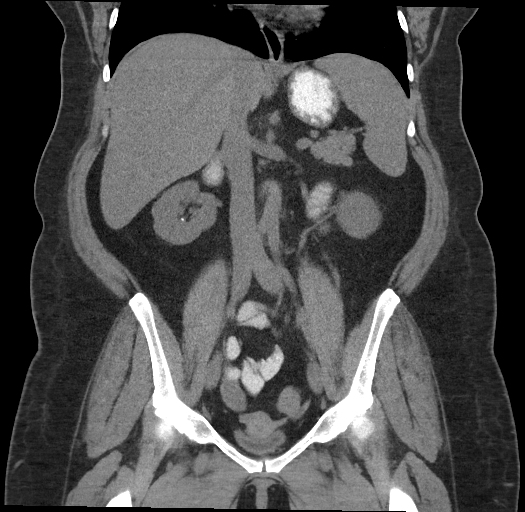

[17 of 46 positions shown; findings below may reference images not displayed]

FINDINGS: Lower chest: No acute abnormality.

Hepatobiliary: No focal liver abnormality is seen. Status post
cholecystectomy. No biliary dilatation.

Pancreas: Unremarkable

Spleen: Unremarkable

Adrenals/Urinary Tract: The adrenal glands are unremarkable. The
kidneys are normal in size and position. Punctate nonobstructing
renal calculi measuring 1-2 mm are noted within the lower pole the
right kidney and upper pole the left kidney. No hydronephrosis. No
ureteral calculi. No perinephric fluid collections or inflammatory
stranding. The bladder is decompressed and is unremarkable.

Stomach/Bowel: Stomach is within normal limits. Appendix appears
normal. No evidence of bowel wall thickening, distention, or
inflammatory changes. No free intraperitoneal gas or fluid.

Vascular/Lymphatic: No significant vascular findings are present. No
enlarged abdominal or pelvic lymph nodes.

Reproductive: Uterus and bilateral adnexa are unremarkable.

Other: No abdominal wall hernia.

Musculoskeletal: No acute or significant osseous findings.
IMPRESSION: No acute intra-abdominal pathology. No definite radiographic
explanation for the patient's reported symptoms.

Minimal bilateral nonobstructing nephrolithiasis. No urolithiasis.
No hydronephrosis.

## 2023-10-06 ENCOUNTER — Encounter: Payer: Self-pay | Admitting: Oncology

## 2023-10-07 ENCOUNTER — Ambulatory Visit (INDEPENDENT_AMBULATORY_CARE_PROVIDER_SITE_OTHER): Payer: MEDICAID | Admitting: Nurse Practitioner

## 2023-10-07 ENCOUNTER — Encounter: Payer: Self-pay | Admitting: Nurse Practitioner

## 2023-10-07 VITALS — BP 100/67 | HR 90 | Temp 98.1°F | Ht 64.0 in | Wt 222.2 lb

## 2023-10-07 DIAGNOSIS — G8929 Other chronic pain: Secondary | ICD-10-CM | POA: Diagnosis not present

## 2023-10-07 DIAGNOSIS — F603 Borderline personality disorder: Secondary | ICD-10-CM | POA: Diagnosis not present

## 2023-10-07 DIAGNOSIS — M545 Low back pain, unspecified: Secondary | ICD-10-CM | POA: Diagnosis not present

## 2023-10-07 MED ORDER — BACLOFEN 20 MG PO TABS: 20.0000 mg | ORAL_TABLET | Freq: Three times a day (TID) | ORAL | 0 refills | Status: DC

## 2023-10-07 NOTE — Assessment & Plan Note (Signed)
Chronic.  Patient has complicated psychiatric history including substance abuse.  Has been seen at [facility] in the past and currently being seen at [facility] in [facility].  On Lamictal, Effexor and Lorazepam.  Anxiety is not well controlled likely due to Lorazepam being short acting.  Will refer patient to new psychiatrist for management of medications.  Follow up in 1 month.
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
## 2023-10-15 ENCOUNTER — Telehealth: Payer: Self-pay | Admitting: Nurse Practitioner

## 2023-10-15 NOTE — Telephone Encounter (Unsigned)
Copied from CRM [id number]. Topic: General - Other >> Oct 15, 2023  9:26 AM Turkey B wrote: Reason for CRM: pt called in wants to have blood work for her thyroid,  and labs for hashimoto disease.

## 2023-10-15 NOTE — Telephone Encounter (Signed)
Called and scheduled patient on 10/19/2023 @ 8:40 am.

## 2023-10-18 NOTE — Progress Notes (Addendum)
BP 129/85 (BP Location: Left Arm, Patient Position: Sitting, Cuff Size: Large)   Pulse 83   Temp 97.8 F (36.6 C) (Oral)   Ht 5\' 4"  (1.626 m)   Wt 221 lb 3.2 oz (100.3 kg)   LMP 05/19/2023 (Approximate)   SpO2 97%   BMI 37.97 kg/m    Subjective:    Patient ID: [name], female    DOB: May 04, 1986, 37 y.o.   MRN: [id number]  HPI: [name] is a 37 y.o. female  Chief Complaint  Patient presents with   chest cold     Coughing up phlegm    Sore Throat   labwork    Would like labs to test for thyroid issues and hashimoto    HYPOTHYROIDISM Family history of hashimoto's and hypothyroidism.   Thyroid control status:stable Satisfied with current treatment? no Medication side effects: no Medication compliance: poor compliance Etiology of hypothyroidism:  Recent dose adjustment:no Fatigue: yes Cold intolerance: no Heat intolerance: yes Weight gain: no Weight loss: no Constipation: yes Diarrhea/loose stools: no Palpitations:yes Lower extremity edema: no Anxiety/depressed mood: yes  UPPER RESPIRATORY TRACT INFECTION Worst symptom: symptoms started a week ago Fever: yes Cough: yes Shortness of breath: yes Wheezing: yes Chest pain: no Chest tightness: yes Chest congestion: yes Nasal congestion: yes Runny nose: yes Post nasal drip: yes Sneezing: no Sore throat: yes Swollen glands: no Sinus pressure: no Headache: no Face pain: no Toothache: no Ear pain: no bilateral Ear pressure: no bilateral Eyes red/itching:no Eye drainage/crusting: no  Vomiting: no Rash: no Fatigue: yes Sick contacts: yes Strep contacts: no  Context: better Recurrent sinusitis: no Relief with OTC cold/cough medications: yes  Treatments attempted:  delsym and pseudoephedrine   Relevant past medical, surgical, family and social history reviewed and updated as indicated. Interim medical history since our last visit reviewed. Allergies and medications reviewed and  updated.  Review of Systems  Constitutional:  Positive for fatigue. Negative for fever and unexpected weight change.  HENT:  Positive for congestion, postnasal drip, rhinorrhea, sinus pressure, sinus pain, sneezing and sore throat. Negative for dental problem and ear pain.   Respiratory:  Positive for cough and shortness of breath. Negative for wheezing.   Cardiovascular:  Positive for palpitations. Negative for chest pain and leg swelling.  Gastrointestinal:  Positive for constipation. Negative for diarrhea and vomiting.  Endocrine: Negative for cold intolerance and heat intolerance.  Skin:  Negative for rash.  Neurological:  Negative for headaches.  Psychiatric/Behavioral:  Positive for dysphoric mood. The patient is nervous/anxious.     Per HPI unless specifically indicated above     Objective:    BP 129/85 (BP Location: Left Arm, Patient Position: Sitting, Cuff Size: Large)   Pulse 83   Temp 97.8 F (36.6 C) (Oral)   Ht 5\' 4"  (1.626 m)   Wt 221 lb 3.2 oz (100.3 kg)   LMP 05/19/2023 (Approximate)   SpO2 97%   BMI 37.97 kg/m   Wt Readings from Last 3 Encounters:  11/01/23 221 lb 1.9 oz (100.3 kg)  10/19/23 221 lb 3.2 oz (100.3 kg)  10/07/23 222 lb 3.2 oz (100.8 kg)    Physical Exam Vitals and nursing note reviewed.  Constitutional:      General: She is not in acute distress.    Appearance: Normal appearance. She is normal weight. She is not ill-appearing, toxic-appearing or diaphoretic.  HENT:     Head: Normocephalic.     Right Ear: Tympanic membrane and external ear  normal.     Left Ear: Tympanic membrane and external ear normal.     Nose: Congestion and rhinorrhea present.     Mouth/Throat:     Mouth: Mucous membranes are moist.     Pharynx: Oropharynx is clear. Posterior oropharyngeal erythema present. No oropharyngeal exudate.  Eyes:     General:        Right eye: No discharge.        Left eye: No discharge.     Extraocular Movements: Extraocular movements  intact.     Conjunctiva/sclera: Conjunctivae normal.     Pupils: Pupils are equal, round, and reactive to light.  Cardiovascular:     Rate and Rhythm: Normal rate and regular rhythm.     Heart sounds: No murmur heard. Pulmonary:     Effort: Pulmonary effort is normal. No respiratory distress.     Breath sounds: Normal breath sounds. No wheezing or rales.  Musculoskeletal:     Cervical back: Normal range of motion and neck supple.  Skin:    General: Skin is warm and dry.     Capillary Refill: Capillary refill takes less than 2 seconds.  Neurological:     General: No focal deficit present.     Mental Status: She is alert and oriented to person, place, and time. Mental status is at baseline.  Psychiatric:        Mood and Affect: Mood normal.        Behavior: Behavior normal.        Thought Content: Thought content normal.        Judgment: Judgment normal.     Results for orders placed or performed in visit on 10/19/23  Rapid Strep screen([facility])   Specimen: Other   Other  Result Value Ref Range   Strep Gp A Ag, IA W/Reflex Negative Negative  Culture, Group A Strep   Other  Result Value Ref Range   Strep A Culture Negative   Thyroid Panel With TSH  Result Value Ref Range   TSH 2.320 0.450 - 4.500 uIU/mL   T4, Total 9.3 4.5 - 12.0 ug/dL   T3 Uptake Ratio 22 (L) 24 - 39 %   Free Thyroxine Index 2.0 1.2 - 4.9  Thyroid peroxidase antibody  Result Value Ref Range   Thyroperoxidase Ab SerPl-aCnc 15 0 - 34 IU/mL      Assessment & Plan:   Problem List Items Addressed This Visit       Other   Acute cough   Other Visit Diagnoses     Acquired hypothyroidism    -  Primary   Family history. Patient is having fatigue, palpitations, anxiety/depression. Will check thyroid labs at visit today.   Relevant Orders   Thyroid Panel With TSH (Completed)   Thyroid peroxidase antibody (Completed)   Viral upper respiratory tract infection       Likely viral.  Will send  medrol dose pak, benzonatate for cough and albuterol PRN.  Follow up if not improved.   Sore throat       Relevant Orders   Rapid Strep screen([facility]) (Completed)        Follow up plan: Return if symptoms worsen or fail to improve.

## 2023-10-19 ENCOUNTER — Telehealth: Payer: Self-pay | Admitting: Nurse Practitioner

## 2023-10-19 ENCOUNTER — Encounter: Payer: Self-pay | Admitting: Nurse Practitioner

## 2023-10-19 ENCOUNTER — Ambulatory Visit (INDEPENDENT_AMBULATORY_CARE_PROVIDER_SITE_OTHER): Payer: MEDICAID | Admitting: Nurse Practitioner

## 2023-10-19 VITALS — BP 129/85 | HR 83 | Temp 97.8°F | Ht 64.0 in | Wt 221.2 lb

## 2023-10-19 DIAGNOSIS — J029 Acute pharyngitis, unspecified: Secondary | ICD-10-CM | POA: Diagnosis not present

## 2023-10-19 DIAGNOSIS — E039 Hypothyroidism, unspecified: Secondary | ICD-10-CM

## 2023-10-19 DIAGNOSIS — R051 Acute cough: Secondary | ICD-10-CM | POA: Diagnosis not present

## 2023-10-19 DIAGNOSIS — J069 Acute upper respiratory infection, unspecified: Secondary | ICD-10-CM

## 2023-10-19 MED ORDER — ALBUTEROL SULFATE HFA 108 (90 BASE) MCG/ACT IN AERS: 2.0000 | INHALATION_SPRAY | Freq: Four times a day (QID) | RESPIRATORY_TRACT | 0 refills | Status: DC | PRN

## 2023-10-19 MED ORDER — METHYLPREDNISOLONE 4 MG PO TBPK: ORAL_TABLET | ORAL | 0 refills | Status: DC

## 2023-10-19 MED ORDER — BENZONATATE 200 MG PO CAPS: 200.0000 mg | ORAL_CAPSULE | Freq: Two times a day (BID) | ORAL | 0 refills | Status: DC | PRN

## 2023-10-19 NOTE — Telephone Encounter (Signed)
Patient states that she forgot to mention to provider that she will be out of her Baclofen(Lioresal) 20 mg  on 11/30 and will not be sen again until Dec 3. Patient woul like a

## 2023-10-19 NOTE — Telephone Encounter (Unsigned)
Copied from CRM [id number]. Topic: General - Other >> Oct 19, 2023 10:12 AM Macon Large wrote: Reason for CRM: Pt stated that she can not take the methylPREDNISolone (MEDROL DOSEPAK) 4 MG TBPK tablet due to the taste. Pt requests that a Rx for prednisone 20 mg be sent to her pharmacy.

## 2023-10-20 ENCOUNTER — Ambulatory Visit
Admission: RE | Admit: 2023-10-20 | Discharge: 2023-10-20 | Disposition: A | Discharge status: Home / Self Care | Payer: MEDICAID | Attending: Nurse Practitioner | Admitting: Nurse Practitioner

## 2023-10-20 ENCOUNTER — Ambulatory Visit
Admission: RE | Admit: 2023-10-20 | Discharge: 2023-10-20 | Disposition: A | Discharge status: Home / Self Care | Payer: MEDICAID | Source: Ambulatory Visit | Attending: Nurse Practitioner

## 2023-10-20 ENCOUNTER — Encounter: Payer: Self-pay | Admitting: Oncology

## 2023-10-20 DIAGNOSIS — G8929 Other chronic pain: Secondary | ICD-10-CM | POA: Insufficient documentation

## 2023-10-20 DIAGNOSIS — M545 Low back pain, unspecified: Secondary | ICD-10-CM

## 2023-10-20 LAB — THYROID PANEL WITH TSH
Free Thyroxine Index: 2 (ref 1.2–4.9)
T3 Uptake Ratio: 22 % — ABNORMAL LOW (ref 24–39)
T4, Total: 9.3 ug/dL (ref 4.5–12.0)
TSH: 2.32 u[IU]/mL (ref 0.450–4.500)

## 2023-10-20 LAB — THYROID PEROXIDASE ANTIBODY: Thyroperoxidase Ab SerPl-aCnc: 15 [IU]/mL (ref 0–34)

## 2023-10-20 MED ORDER — PREDNISONE 20 MG PO TABS: 20.0000 mg | ORAL_TABLET | Freq: Every day | ORAL | 0 refills | Status: DC

## 2023-10-20 NOTE — Telephone Encounter (Signed)
Responded to via Mychart

## 2023-10-22 LAB — CULTURE, GROUP A STREP: Strep A Culture: NEGATIVE

## 2023-10-22 LAB — RAPID STREP SCREEN (MED CTR MEBANE ONLY): Strep Gp A Ag, IA W/Reflex: NEGATIVE

## 2023-11-01 ENCOUNTER — Emergency Department: Payer: MEDICAID

## 2023-11-01 ENCOUNTER — Encounter: Payer: Self-pay | Admitting: Emergency Medicine

## 2023-11-01 ENCOUNTER — Emergency Department
Admission: EM | Admit: 2023-11-01 | Discharge: 2023-11-01 | Disposition: A | Discharge status: Home / Self Care | Payer: MEDICAID | Attending: Emergency Medicine | Admitting: Emergency Medicine

## 2023-11-01 ENCOUNTER — Other Ambulatory Visit: Payer: Self-pay

## 2023-11-01 DIAGNOSIS — R519 Headache, unspecified: Secondary | ICD-10-CM | POA: Diagnosis present

## 2023-11-01 DIAGNOSIS — Y92410 Unspecified street and highway as the place of occurrence of the external cause: Secondary | ICD-10-CM | POA: Diagnosis not present
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
## 2023-11-09 ENCOUNTER — Ambulatory Visit: Payer: MEDICAID | Admitting: Nurse Practitioner

## 2023-11-09 ENCOUNTER — Other Ambulatory Visit: Payer: Self-pay | Admitting: Nurse Practitioner
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
## 2023-11-09 NOTE — Telephone Encounter (Signed)
Requested medications are due for refill today.  yes  Requested medications are on the active medications list.  yes  Last refill. 10/07/2023 #90 0 rf  Future visit scheduled.   yes  Notes to clinic.  Labs are expired.    Requested Prescriptions  Pending Prescriptions Disp Refills   baclofen (LIORESAL) 20 MG tablet [Pharmacy Med Name: BACLOFEN 20MG  TABLETS] 90 tablet     Sig: TAKE 1 TABLET(20 MG) BY MOUTH THREE TIMES DAILY     Analgesics:  Muscle Relaxants - baclofen Failed - 11/09/2023  4:45 PM      Failed - Cr in normal range and within 180 days    Creatinine  Date Value Ref Range Status  04/28/2014 0.79 0.60 - 1.30 mg/dL Final   Creatinine, Ser  Date Value Ref Range Status  03/29/2022 0.63 0.44 - 1.00 mg/dL Final         Failed - eGFR is 30 or above and within 180 days    EGFR (African American)  Date Value Ref Range Status  04/28/2014 >60  Final   GFR calc Af Amer  Date Value Ref Range Status  11/22/2018 >60 >60 mL/min Final   EGFR (Non-African Amer.)  Date Value Ref Range Status  04/28/2014 >60  Final    Comment:    eGFR values <80mL/min/1.73 m2 may be an indication of chronic kidney disease (CKD). Calculated eGFR is useful in patients with stable renal function. The eGFR calculation will not be reliable in acutely ill patients when serum creatinine is changing rapidly. It is not useful in  patients on dialysis. The eGFR calculation may not be applicable to patients at the low and high extremes of body sizes, pregnant women, and vegetarians.    GFR, Estimated  Date Value Ref Range Status  03/29/2022 >60 >60 mL/min Final    Comment:    (NOTE) Calculated using the CKD-EPI Creatinine Equation (2021)    eGFR  Date Value Ref Range Status  09/03/2021 116 >59 mL/min/1.73 Final         Passed - Valid encounter within last 6 months    Recent Outpatient Visits           3 weeks ago Acquired hypothyroidism   [place] [facility]  Larae Grooms, NP   1 month ago Borderline personality disorder [facility])   [place] [facility] Larae Grooms, NP   6 months ago Attention deficit hyperactivity disorder (ADHD), unspecified ADHD type   [place] [facility] Larae Grooms, NP   10 months ago Need for influenza vaccination   [place] [facility] Larae Grooms, NP   1 year ago Chronic obstructive pulmonary disease, unspecified COPD type [facility])   [place] [facility] Larae Grooms, NP       Future Appointments             In 6 days Larae Grooms, NP  [facility]

## 2023-11-10 ENCOUNTER — Telehealth: Payer: Self-pay | Admitting: Nurse Practitioner

## 2023-11-10 MED ORDER — BACLOFEN 20 MG PO TABS: 20.0000 mg | ORAL_TABLET | Freq: Three times a day (TID) | ORAL | 0 refills | Status: DC

## 2023-11-10 NOTE — Telephone Encounter (Signed)
Patient cancelled her follow up.  Will have to wait for her next appt to discuss further.

## 2023-11-10 NOTE — Telephone Encounter (Signed)
Provider will send enough medication until her next appt.

## 2023-11-10 NOTE — Telephone Encounter (Signed)
Short supply sent to get patient to her appt.

## 2023-11-10 NOTE — Telephone Encounter (Signed)
Patient called stated she has been without her baclofen (LIORESAL) 20 MG tablet since 11/30. Please f/u with patient

## 2023-11-13 ENCOUNTER — Other Ambulatory Visit: Payer: Self-pay | Admitting: Nurse Practitioner

## 2023-11-15 ENCOUNTER — Ambulatory Visit (INDEPENDENT_AMBULATORY_CARE_PROVIDER_SITE_OTHER): Payer: MEDICAID | Admitting: Nurse Practitioner

## 2023-11-15 ENCOUNTER — Encounter: Payer: Self-pay | Admitting: Nurse Practitioner

## 2023-11-15 VITALS — BP 129/79 | HR 94 | Temp 98.2°F | Ht 64.0 in | Wt 216.8 lb

## 2023-11-15 DIAGNOSIS — M545 Low back pain, unspecified: Secondary | ICD-10-CM

## 2023-11-15 DIAGNOSIS — F603 Borderline personality disorder: Secondary | ICD-10-CM | POA: Diagnosis not present

## 2023-11-15 DIAGNOSIS — G8929 Other chronic pain: Secondary | ICD-10-CM | POA: Diagnosis not present

## 2023-11-15 MED ORDER — BACLOFEN 20 MG PO TABS: 20.0000 mg | ORAL_TABLET | Freq: Three times a day (TID) | ORAL | 2 refills | Status: DC

## 2023-11-15 NOTE — Progress Notes (Signed)
BP 129/79 (BP Location: Left Arm, Patient Position: Sitting, Cuff Size: Normal)   Pulse 94   Temp 98.2 F (36.8 C) (Oral)   Ht 5\' 4"  (1.626 m)   Wt 216 lb 12.8 oz (98.3 kg)   LMP 10/20/2023 (Approximate)   SpO2 96%   BMI 37.21 kg/m    Subjective:    Patient ID: [name], female    DOB: [birth year], 37 y.o.   MRN: [id number]  HPI: [name] is a 37 y.o. female  Chief Complaint  Patient presents with   Medication Management    Baclofen, would like to discuss lamictal and effexor, states they are ineffective    Patient states she does not feel like her Lamictal and Effexor are not effective.  She was previously seen at [facility] for psych.   Has not had great luck with them but does plan to go back.     Patient states she has been having back pain for several years.  Patient was previously on baclofen 20mg  TID from another provider.  Patient states it is low back pain.  States she hasn't had any injuries that caused the pain.  Patient has tried BC, Tylenol and motrin which hasn't helped.  She does not want to be on pain medication.  Baclofen is the only thing that has helped her in the past.  Denies sciatica.  States it is mostly on her left side but can be on both.   Relevant past medical, surgical, family and social history reviewed and updated as indicated. Interim medical history since our last visit reviewed. Allergies and medications reviewed and updated.  Review of Systems  Musculoskeletal:  Positive for back pain.  Psychiatric/Behavioral:  Positive for agitation and dysphoric mood. The patient is nervous/anxious.     Per HPI unless specifically indicated above     Objective:    BP 129/79 (BP Location: Left Arm, Patient Position: Sitting, Cuff Size: Normal)   Pulse 94   Temp 98.2 F (36.8 C) (Oral)   Ht 5\' 4"  (1.626 m)   Wt 216 lb 12.8 oz (98.3 kg)   LMP 10/20/2023 (Approximate)   SpO2 96%   BMI 37.21 kg/m   Wt Readings from Last 3 Encounters:   11/15/23 216 lb 12.8 oz (98.3 kg)  11/01/23 221 lb 1.9 oz (100.3 kg)  10/19/23 221 lb 3.2 oz (100.3 kg)    Physical Exam Vitals and nursing note reviewed.  Constitutional:      General: She is not in acute distress.    Appearance: Normal appearance. She is normal weight. She is not ill-appearing, toxic-appearing or diaphoretic.  HENT:     Head: Normocephalic.     Right Ear: External ear normal.     Left Ear: External ear normal.     Nose: Nose normal.     Mouth/Throat:     Mouth: Mucous membranes are moist.     Pharynx: Oropharynx is clear.  Eyes:     General:        Right eye: No discharge.        Left eye: No discharge.     Extraocular Movements: Extraocular movements intact.     Conjunctiva/sclera: Conjunctivae normal.     Pupils: Pupils are equal, round, and reactive to light.  Cardiovascular:     Rate and Rhythm: Normal rate and regular rhythm.     Heart sounds: No murmur heard. Pulmonary:     Effort: Pulmonary effort is normal. No respiratory distress.  Breath sounds: Normal breath sounds. No wheezing or rales.  Musculoskeletal:     Cervical back: Normal range of motion and neck supple.  Skin:    General: Skin is warm and dry.     Capillary Refill: Capillary refill takes less than 2 seconds.  Neurological:     General: No focal deficit present.     Mental Status: She is alert and oriented to person, place, and time. Mental status is at baseline.  Psychiatric:        Mood and Affect: Mood normal.        Behavior: Behavior normal.        Thought Content: Thought content normal.        Judgment: Judgment normal.     Results for orders placed or performed in visit on 10/19/23  Rapid Strep screen([facility])   Specimen: Other   Other  Result Value Ref Range   Strep Gp A Ag, IA W/Reflex Negative Negative  Culture, Group A Strep   Other  Result Value Ref Range   Strep A Culture Negative   Thyroid Panel With TSH  Result Value Ref Range   TSH 2.320  0.450 - 4.500 uIU/mL   T4, Total 9.3 4.5 - 12.0 ug/dL   T3 Uptake Ratio 22 (L) 24 - 39 %   Free Thyroxine Index 2.0 1.2 - 4.9  Thyroid peroxidase antibody  Result Value Ref Range   Thyroperoxidase Ab SerPl-aCnc 15 0 - 34 IU/mL      Assessment & Plan:   Problem List Items Addressed This Visit       Other   Borderline personality disorder (HCC) - Primary    Chronic.  Patient has complicated psychiatric history including substance abuse.  Has been seen at [facility] in the past and currently being seen at [facility] in [facility]  On Lamictal, Effexor and Lorazepam.  Anxiety is not well controlled likely due to Lorazepam being short acting.  Will refer patient to new psychiatrist for management of medications.  Follow up in 1 month.       Relevant Orders   Ambulatory referral to Psychiatry   Other Visit Diagnoses     Chronic left-sided low back pain without sciatica       Chronic condition. Previously on Baclofen with Sublucaid. Came off medication while in jail. No longer seeing provider for Sublocaid but needs refill of Baclofen.  Will obtain xray of lumbar spine and refill Baclofen today. May need referral for ortho in the future.   Relevant Medications   lamoTRIgine (LAMICTAL) 150 MG tablet   venlafaxine (EFFEXOR) 100 MG tablet   venlafaxine (EFFEXOR) 75 MG tablet   baclofen (LIORESAL) 20 MG tablet   Other Relevant Orders   DG Lumbar Spine Complete        Follow up plan: Return in about 3 months (around 02/13/2024) for Depression/Anxiety FU and back pain.

## 2023-11-15 NOTE — Telephone Encounter (Signed)
Requested Prescriptions  Pending Prescriptions Disp Refills   albuterol (VENTOLIN HFA) 108 (90 Base) MCG/ACT inhaler [Pharmacy Med Name: VENTOLIN HFA INH W/DOS CTR 200PUFFS] 8 g 0    Sig: INHALE 2 PUFFS INTO THE LUNGS EVERY 6 HOURS AS NEEDED FOR WHEEZING OR SHORTNESS OF BREATH     Pulmonology:  Beta Agonists 2 Failed - 11/13/2023  9:51 AM      Failed - Last BP in normal range    BP Readings from Last 1 Encounters:  11/15/23 129/79         Passed - Last Heart Rate in normal range    Pulse Readings from Last 1 Encounters:  11/15/23 94         Passed - Valid encounter within last 12 months    Recent Outpatient Visits           Today Borderline personality disorder [place] [facility])   [place] [facility] Larae Grooms, NP   3 weeks ago Acquired hypothyroidism   [place] [facility] Larae Grooms, NP   1 month ago Borderline personality disorder [facility])   [place] [facility] Larae Grooms, NP   6 months ago Attention deficit hyperactivity disorder (ADHD), unspecified ADHD type   [place] [facility] Larae Grooms, NP   10 months ago Need for influenza vaccination   [place] [facility] Larae Grooms, NP       Future Appointments             In 3 months Larae Grooms, NP [place] [facility]

## 2023-11-15 NOTE — Assessment & Plan Note (Signed)
Chronic.  Not well controlled at this time.  Discussed returning to [facility] due to being able to be seen faster.

## 2023-11-17 ENCOUNTER — Encounter: Payer: Self-pay | Admitting: Nurse Practitioner

## 2023-11-17 NOTE — Addendum Note (Signed)
Addended by: Larae Grooms on: 11/17/2023 12:35 PM   Modules accepted: Orders

## 2023-12-12 ENCOUNTER — Emergency Department
Admission: EM | Admit: 2023-12-12 | Discharge: 2023-12-13 | Disposition: A | Discharge status: Other Acute Inpatient Hospital | Payer: MEDICAID | Attending: Emergency Medicine | Admitting: Emergency Medicine

## 2023-12-12 ENCOUNTER — Emergency Department: Payer: MEDICAID

## 2023-12-12 ENCOUNTER — Other Ambulatory Visit: Payer: Self-pay

## 2023-12-12 DIAGNOSIS — Z6841 Body Mass Index (BMI) 40.0 and over, adult: Secondary | ICD-10-CM | POA: Insufficient documentation

## 2023-12-12 DIAGNOSIS — R748 Abnormal levels of other serum enzymes: Secondary | ICD-10-CM | POA: Insufficient documentation

## 2023-12-12 DIAGNOSIS — F603 Borderline personality disorder: Secondary | ICD-10-CM | POA: Diagnosis present

## 2023-12-12 DIAGNOSIS — F32A Depression, unspecified: Secondary | ICD-10-CM | POA: Diagnosis present

## 2023-12-12 DIAGNOSIS — T1491XA Suicide attempt, initial encounter: Secondary | ICD-10-CM | POA: Diagnosis not present

## 2023-12-12 DIAGNOSIS — R4589 Other symptoms and signs involving emotional state: Secondary | ICD-10-CM | POA: Diagnosis present

## 2023-12-12 DIAGNOSIS — R451 Restlessness and agitation: Secondary | ICD-10-CM | POA: Diagnosis present

## 2023-12-12 LAB — CBC WITH DIFFERENTIAL/PLATELET
Abs Immature Granulocytes: 0.01 10*3/uL (ref 0.00–0.07)
Basophils Absolute: 0 10*3/uL (ref 0.0–0.1)
Basophils Relative: 0 %
Eosinophils Absolute: 0.1 10*3/uL (ref 0.0–0.5)
Eosinophils Relative: 1 %
HCT: 43.1 % (ref 36.0–46.0)
Hemoglobin: 14.3 g/dL (ref 12.0–15.0)
Immature Granulocytes: 0 %
Lymphocytes Relative: 30 %
Lymphs Abs: 2.1 10*3/uL (ref 0.7–4.0)
MCH: 28.8 pg (ref 26.0–34.0)
MCHC: 33.2 g/dL (ref 30.0–36.0)
MCV: 86.9 fL (ref 80.0–100.0)
Monocytes Absolute: 0.3 10*3/uL (ref 0.1–1.0)
Monocytes Relative: 4 %
Neutro Abs: 4.7 10*3/uL (ref 1.7–7.7)
Neutrophils Relative %: 65 %
Platelets: 170 10*3/uL (ref 150–400)
RBC: 4.96 MIL/uL (ref 3.87–5.11)
RDW: 13.1 % (ref 11.5–15.5)
WBC: 7.2 10*3/uL (ref 4.0–10.5)
nRBC: 0 % (ref 0.0–0.2)

## 2023-12-12 LAB — ACETAMINOPHEN LEVEL
Acetaminophen (Tylenol), Serum: <10 ug/mL — ABNORMAL LOW (ref 10–30)
Acetaminophen (Tylenol), Serum: <10 ug/mL — ABNORMAL LOW (ref 10–30)

## 2023-12-12 LAB — SALICYLATE LEVEL: Salicylate Lvl: <7 mg/dL — ABNORMAL LOW (ref 7.0–30.0)

## 2023-12-12 LAB — MAGNESIUM: Magnesium: 2.3 mg/dL (ref 1.7–2.4)

## 2023-12-12 LAB — LACTIC ACID, PLASMA: Lactic Acid, Venous: 1.5 mmol/L (ref 0.5–1.9)

## 2023-12-12 LAB — LITHIUM LEVEL: Lithium Lvl: <0.06 mmol/L — ABNORMAL LOW (ref 0.60–1.20)

## 2023-12-12 LAB — COMPREHENSIVE METABOLIC PANEL
ALT: 29 U/L (ref 0–44)
AST: 25 U/L (ref 15–41)
Albumin: 4.1 g/dL (ref 3.5–5.0)
Alkaline Phosphatase: 73 U/L (ref 38–126)
Anion gap: 10 (ref 5–15)
BUN: 10 mg/dL (ref 6–20)
CO2: 23 mmol/L (ref 22–32)
Calcium: 8.7 mg/dL — ABNORMAL LOW (ref 8.9–10.3)
Chloride: 105 mmol/L (ref 98–111)
Creatinine, Ser: 0.72 mg/dL (ref 0.44–1.00)
GFR, Estimated: >60 mL/min (ref >60)
Glucose, Bld: 150 mg/dL — ABNORMAL HIGH (ref 70–99)
Potassium: 3.2 mmol/L — ABNORMAL LOW (ref 3.5–5.1)
Sodium: 138 mmol/L (ref 135–145)
Total Bilirubin: 0.7 mg/dL (ref 0.0–1.2)
Total Protein: 6.9 g/dL (ref 6.5–8.1)

## 2023-12-12 LAB — ETHANOL: Alcohol, Ethyl (B): <10 mg/dL (ref <10)

## 2023-12-12 LAB — HCG, QUANTITATIVE, PREGNANCY: hCG, Beta Chain, Quant, S: <1 m[IU]/mL (ref <5)

## 2023-12-12 LAB — CBG MONITORING, ED: Glucose-Capillary: 144 mg/dL — ABNORMAL HIGH (ref 70–99)

## 2023-12-12 LAB — CK: Total CK: 349 U/L — ABNORMAL HIGH (ref 38–234)

## 2023-12-12 MED ORDER — LACTATED RINGERS IV BOLUS
1000.0000 mL | Freq: Once | INTRAVENOUS | Status: AC
  Administered 2023-12-12: 1000 mL via INTRAVENOUS

## 2023-12-12 MED ORDER — SODIUM CHLORIDE 0.9 % IV BOLUS
1000.0000 mL | Freq: Once | INTRAVENOUS | Status: AC
  Administered 2023-12-12: 1000 mL via INTRAVENOUS

## 2023-12-12 NOTE — BH Assessment (Signed)
 Comprehensive Clinical Assessment (CCA) Note  12/12/2023 [name] [id number] Recommendations for Services/Supports/Treatments: Psych NP Rashaun D. determined pt. meets psychiatric inpatient criteria. [name] is a 38 year old, White or Caucasian race, Not Hispanic or Latino ethnicity, ENGLISH speaking female with a history of borderline personality disorder, ADHD, cannabis abuse, and cocaine abuse. Pt is under IVC. Per triage note: PT BIB [facility] for possible drug overdose. Pt is from home, per EMS and police pt has only been screaming, and yelling please stop. Pt does have a hx of OD per EMS and is a heroin user. Per EMS, mom states she thinks pt took an entire bottle of one of her home meds. EMS arrives with two full bottles of Venlafaxine , they believe were recently filled. Pt given a total of 5mg  Versed  and 2mg  Haldol . On assessment, the patient was guarded and reticent about her reasons for presenting to the hospital. Pt was adamant that she had no memory of events prior to coming to the ED. Pt denied having any recent life changes or stressors. Pt was not fully oriented, as she responded that the year is 2005. Pt had poor insight, explaining that she believes she just needs to go home. Pt became visibly agitated when told that she needed psychiatric admission. Pt is not connected to any services at this time. Pt had clear, coherent speech. Pt's thoughts were intact and relevant. Pt presented with an irritable mood; affect was congruent. The patient denied current symptoms of depression or SI. Pt denied any substance use.  Chief Complaint:  Chief Complaint  Patient presents with   Drug Overdose   Visit Diagnosis: Suicidal behavior   Borderline personality disorder (HCC)   Morbid obesity (HCC)   Depression      CCA Screening, Triage and Referral (STR)  Patient Reported Information How did you hear about us ? No data recorded Referral name: No data recorded Referral phone  number: No data recorded  Whom do you see for routine medical problems? No data recorded Practice/Facility Name: No data recorded Practice/Facility Phone Number: No data recorded Name of Contact: No data recorded Contact Number: No data recorded Contact Fax Number: No data recorded Prescriber Name: No data recorded Prescriber Address (if known): No data recorded  What Is the Reason for Your Visit/Call Today? No data recorded How Long Has This Been Causing You Problems? No data recorded What Do You Feel Would Help You the Most Today? No data recorded  Have You Recently Been in Any Inpatient Treatment (Hospital/Detox/Crisis Center/28-Day Program)? No data recorded Name/Location of Program/Hospital:No data recorded How Long Were You There? No data recorded When Were You Discharged? No data recorded  Have You Ever Received Services From [facility] Before? No data recorded Who Do You See at [facility]? No data recorded  Have You Recently Had Any Thoughts About Hurting Yourself? No data recorded Are You Planning to Commit Suicide/Harm Yourself At This time? No data recorded  Have you Recently Had Thoughts About Hurting Someone [place]? No data recorded Explanation: No data recorded  Have You Used Any Alcohol or Drugs in the Past 24 Hours? No data recorded How Long Ago Did You Use Drugs or Alcohol? No data recorded What Did You Use and How Much? No data recorded  Do You Currently Have a Therapist/Psychiatrist? No data recorded Name of Therapist/Psychiatrist: No data recorded  Have You Been Recently Discharged From Any Office Practice or Programs? No data recorded Explanation of Discharge From Practice/Program: No data recorded  CCA Screening Triage Referral Assessment Type of Contact: No data recorded Is this Initial or Reassessment? No data recorded Date Telepsych consult ordered in [facility]:  No data recorded Time Telepsych consult ordered in [facility]:  No data recorded  Patient  Reported Information Reviewed? No data recorded Patient Left Without Being Seen? No data recorded Reason for Not Completing Assessment: No data recorded  Collateral Involvement: No data recorded  Does Patient Have a Court Appointed Legal Guardian? No data recorded Name and Contact of Legal Guardian: No data recorded If Minor and Not Living with Parent(s), Who has Custody? No data recorded Is CPS involved or ever been involved? No data recorded Is APS involved or ever been involved? No data recorded  Patient Determined To Be At Risk for Harm To Self or Others Based on Review of Patient Reported Information or Presenting Complaint? No data recorded Method: No data recorded Availability of Means: No data recorded Intent: No data recorded Notification Required: No data recorded Additional Information for Danger to Others Potential: No data recorded Additional Comments for Danger to Others Potential: No data recorded Are There Guns or Other Weapons in Your Home? No data recorded Types of Guns/Weapons: No data recorded Are These Weapons Safely Secured?                            No data recorded Who Could Verify You Are Able To Have These Secured: No data recorded Do You Have any Outstanding Charges, Pending Court Dates, Parole/Probation? No data recorded Contacted To Inform of Risk of Harm To Self or Others: No data recorded  Location of Assessment: No data recorded  Does Patient Present under Involuntary Commitment? No data recorded IVC Papers Initial File Date: No data recorded  [place] of Residence: No data recorded  Patient Currently Receiving the Following Services: No data recorded  Determination of Need: No data recorded  Options For Referral: No data recorded    CCA Biopsychosocial Intake/Chief Complaint:  No data recorded Current Symptoms/Problems: No data recorded  Patient Reported Schizophrenia/Schizoaffective Diagnosis in Past: No data recorded  Strengths: No data  recorded Preferences: No data recorded Abilities: No data recorded  Type of Services Patient Feels are Needed: No data recorded  Initial Clinical Notes/Concerns: No data recorded  Mental Health Symptoms Depression:  No data recorded  Duration of Depressive symptoms: No data recorded  Mania:  No data recorded  Anxiety:   No data recorded  Psychosis:  No data recorded  Duration of Psychotic symptoms: No data recorded  Trauma:  No data recorded  Obsessions:  No data recorded  Compulsions:  No data recorded  Inattention:  No data recorded  Hyperactivity/Impulsivity:  No data recorded  Oppositional/Defiant Behaviors:  No data recorded  Emotional Irregularity:  No data recorded  Other Mood/Personality Symptoms:  No data recorded   Mental Status Exam Appearance and self-care  Stature:  No data recorded  Weight:  No data recorded  Clothing:  No data recorded  Grooming:  No data recorded  Cosmetic use:  No data recorded  Posture/gait:  No data recorded  Motor activity:  No data recorded  Sensorium  Attention:  No data recorded  Concentration:  No data recorded  Orientation:  No data recorded  Recall/memory:  No data recorded  Affect and Mood  Affect:  No data recorded  Mood:  No data recorded  Relating  Eye contact:  No data recorded  Facial expression:  No  data recorded  Attitude toward examiner:  No data recorded  Thought and Language  Speech flow: No data recorded  Thought content:  No data recorded  Preoccupation:  No data recorded  Hallucinations:  No data recorded  Organization:  No data recorded  [employer] of Knowledge:  No data recorded  Intelligence:  No data recorded  Abstraction:  No data recorded  Judgement:  No data recorded  Reality Testing:  No data recorded  Insight:  No data recorded  Decision Making:  No data recorded  Social Functioning  Social Maturity:  No data recorded  Social Judgement:  No data recorded  Stress  Stressors:   No data recorded  Coping Ability:  No data recorded  Skill Deficits:  No data recorded  Supports:  No data recorded    Religion:    Leisure/Recreation:    Exercise/Diet:     CCA Employment/Education Employment/Work Situation:    Education:     CCA Family/Childhood History Family and Relationship History:    Childhood History:     Child/Adolescent Assessment:     CCA Substance Use Alcohol/Drug Use:                           ASAM's:  Six Dimensions of Multidimensional Assessment  Dimension 1:  Acute Intoxication and/or Withdrawal Potential:      Dimension 2:  Biomedical Conditions and Complications:      Dimension 3:  Emotional, Behavioral, or Cognitive Conditions and Complications:     Dimension 4:  Readiness to Change:     Dimension 5:  Relapse, Continued use, or Continued Problem Potential:     Dimension 6:  Recovery/Living Environment:     ASAM Severity Score:    ASAM Recommended Level of Treatment:     Substance use Disorder (SUD)    Recommendations for Services/Supports/Treatments:    DSM5 Diagnoses: Patient Active Problem List   Diagnosis Date Noted   Chronic obstructive pulmonary disease (HCC) 02/09/2022   Microcytic anemia 02/02/2022   Depression    Acute cough 12/03/2021   Leg edema 09/30/2021   Dizzinesses 09/11/2021   Palpitations 09/11/2021   Nicotine  dependence with current use 06/26/2021   Morbid obesity (HCC) 06/26/2021   Anemia, mild 03/28/2018   History of drug use 12/14/2017   Symptomatic cholelithiasis    Painful orthopaedic hardware (HCC) 07/23/2015   Borderline personality disorder (HCC) 04/16/2013   Cannabis abuse 04/16/2013   Cocaine abuse (HCC) 04/16/2013   Suicidal behavior 04/16/2013    Patient Centered Plan: Patient is on the following Treatment Plan(s):  Borderline Personality   Referrals to Alternative Service(s): Referred to Alternative Service(s):   Place:   Date:   Time:    Referred to  Alternative Service(s):   Place:   Date:   Time:    Referred to Alternative Service(s):   Place:   Date:   Time:    Referred to Alternative Service(s):   Place:   Date:   Time:      @BHCOLLABOFCARE @  Niraj Kudrna R Tonae Livolsi, LCAS

## 2023-12-12 NOTE — ED Notes (Signed)
 Pt taken to CT.

## 2023-12-12 NOTE — ED Notes (Signed)
 EMS brought in medications on Previous shift to [employer]. This RN sent medication down to pharmacy to store.  Med/Count: Venlafaxine 100 mg tablet/78 count Venlafazine 75 mg tablet/73 count Receipt sent to secretary to store in Pts chart.

## 2023-12-12 NOTE — ED Notes (Signed)
 Psych at bedside.

## 2023-12-12 NOTE — ED Notes (Signed)
 This Rn to bedside to introduce self to pt. Pt is hooked up to cardiac monitoring, not currently dressed out and still not very alert. She will wake up when spoken too.

## 2023-12-12 NOTE — ED Notes (Signed)
 Pt asleep after getting onto ED stretcher and vitals taken. Pt not waking to verbal stimulus or sternal rub.

## 2023-12-12 NOTE — ED Notes (Signed)
 Poison control called for update. Plan is ton continue to monitor with comfort care.

## 2023-12-12 NOTE — ED Triage Notes (Addendum)
 PT BIB [facility] for possible drug overdose. Pt is from home, per EMS and police pt has only been screaming, and yelling please stop. Pt does have a hx of OD per EMS and is a heroin user. Per EMS, mom states she thinks pt took an entire bottle of one of her home meds. EMS arrives with two full bottles of Venlafaxine , they believe were recently filled. Pt given a total of 5mg  Versed  and 2mg  Haldol .

## 2023-12-12 NOTE — ED Provider Notes (Signed)
 Emergency department handoff note  Care of this patient was signed out to me at the end of the previous provider shift.  All pertinent patient information was conveyed and all questions were answered.  Patient pending metabolization ingested medications.  Patient's mental status improved throughout emergency department course.  Patient is medically cleared at this time for psychiatric admission as necessary.  Care of this patient will be signed out to the oncoming physician at the end of my shift.  All pertinent patient information conveyed and all questions answered.  All further care and disposition decisions will be made by the oncoming physician.   Itzel Lowrimore K, MD 12/12/23 [phone]

## 2023-12-12 NOTE — ED Provider Notes (Signed)
 [facility] Provider Note    Event Date/Time   First MD Initiated Contact with Patient 12/12/23 [phone]     (approximate)   History   Drug Overdose EM caveat: Decreased mental status somnolence  HPI  TAITE BALDASSARI is a 38 y.o. female history of bipolar disorder, chronic low back pain. On Lamictal , Effexor  and Lorazepam , and also PCP notes note baclofen  prescription being represcribed   They were called to theEMS reports for a possible overdose.  Patient was acting extremely erratic with elevated mood, agitated.  She required Haldol  and midazolam  for EMS. Additional history from nursing staff here, Zach, relates the patient was initially agitated yelling, and then became calm down somnolent thereafter.  At present the patient cannot provide history  There is no history of known trauma  Physical Exam   Triage Vital Signs: ED Triage Vitals [12/12/23 0900]  Encounter Vitals Group     BP 102/70     Systolic BP Percentile      Diastolic BP Percentile      Pulse Rate 83     Resp 17     Temp 98.1 F (36.7 C)     Temp Source Oral     SpO2 94 %     Weight      Height 5' 4 (1.626 m)     Head Circumference      Peak Flow      Pain Score      Pain Loc      Pain Education      Exclude from Growth Chart     Most recent vital signs: Vitals:   12/13/23 0430 12/13/23 0600  BP: 118/80 (!) 108/57  Pulse: 76   Resp: 12 16  Temp:    SpO2: 99% 98%     General: Resting with normal respiratory pattern.  Pupils equal round reactive to light.  She has extremely somnolent, but arouses to sternal rub.  Opens eyes states 1 or 2 words and goes back to sleep.  On cardiac monitor being watched carefully.  Is protecting airway at this time CV:  Good peripheral perfusion.  Normal tones and rate Resp:  Normal effort.  Clear bilateral.  No wheezes or rales Abd:  No distention.  Soft no obvious tenderness or distention. Other:  Normocephalic atraumatic.  No noted  injuries to the extremities.  No edema.   ED Results / Procedures / Treatments   Labs (all labs ordered are listed, but only abnormal results are displayed) Labs Reviewed  COMPREHENSIVE METABOLIC PANEL - Abnormal; Notable for the following components:      Result Value   Potassium 3.2 (*)    Glucose, Bld 150 (*)    Calcium 8.7 (*)    All other components within normal limits  SALICYLATE LEVEL - Abnormal; Notable for the following components:   Salicylate Lvl <7.0 (*)    All other components within normal limits  ACETAMINOPHEN  LEVEL - Abnormal; Notable for the following components:   Acetaminophen  (Tylenol ), Serum <10 (*)    All other components within normal limits  URINE DRUG SCREEN, QUALITATIVE ([facility] ONLY) - Abnormal; Notable for the following components:   Tricyclic, Ur Screen POSITIVE (*)    Cannabinoid 50 Ng, Ur [place] POSITIVE (*)    Benzodiazepine, Ur Scrn POSITIVE (*)    All other components within normal limits  LITHIUM  LEVEL - Abnormal; Notable for the following components:   Lithium  Lvl <0.06 (*)    All other  components within normal limits  URINALYSIS, ROUTINE W REFLEX MICROSCOPIC - Abnormal; Notable for the following components:   Color, Urine YELLOW (*)    APPearance CLOUDY (*)    Protein, ur 30 (*)    Leukocytes,Ua LARGE (*)    Bacteria, UA RARE (*)    All other components within normal limits  CK - Abnormal; Notable for the following components:   Total CK 349 (*)    All other components within normal limits  LAMOTRIGINE  LEVEL - Abnormal; Notable for the following components:   Lamotrigine  Lvl 1.2 (*)    All other components within normal limits  ACETAMINOPHEN  LEVEL - Abnormal; Notable for the following components:   Acetaminophen  (Tylenol ), Serum <10 (*)    All other components within normal limits  CBG MONITORING, ED - Abnormal; Notable for the following components:   Glucose-Capillary 144 (*)    All other components within normal limits  ETHANOL  CBC  WITH DIFFERENTIAL/PLATELET  HCG, QUANTITATIVE, PREGNANCY  LACTIC ACID, PLASMA  MAGNESIUM   CBG MONITORING, ED     EKG  And interpreted by me at 910 heart rate 80 QRS 120 QTc 470 Normal sinus rhythm no evidence of acute ischemia.  QRS complex normal duration.  No large R wave noted in aVR.  No clear evidence of acute toxidrome   RADIOLOGY   CT head inter by me as grossly negative for acute abnormality  CT Head Wo Contrast Result Date: 12/12/2023 CLINICAL DATA:  Possible drug overdose EXAM: CT HEAD WITHOUT CONTRAST TECHNIQUE: Contiguous axial images were obtained from the base of the skull through the vertex without intravenous contrast. RADIATION DOSE REDUCTION: This exam was performed according to the departmental dose-optimization program which includes automated exposure control, adjustment of the mA and/or kV according to patient size and/or use of iterative reconstruction technique. COMPARISON:  11/01/2023 FINDINGS: Brain: No evidence of acute infarction, hemorrhage, hydrocephalus, extra-axial collection or mass lesion/mass effect. Vascular: No hyperdense vessel or unexpected calcification. 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 Sinuses/Orbits: Negative IMPRESSION: Stable, negative head CT. Electronically Signed   By: Dorn Roulette M.D.   On: 12/12/2023 09:55        PROCEDURES:  Critical Care performed: Yes, see critical care procedure note(s)  CRITICAL CARE Performed by: Oneil Budge   Total critical care time: 35 minutes  Critical care time was exclusive of separately billable procedures and treating other patients.  Critical care was necessary to treat or prevent imminent or life-threatening deterioration.  Critical care was time spent personally by me on the following activities: development of treatment plan with patient and/or surrogate as well as nursing, discussions with  consultants, evaluation of patient's response to treatment, examination of patient, obtaining history from patient or surrogate, ordering and performing treatments and interventions, ordering and review of laboratory studies, ordering and review of radiographic studies, pulse oximetry and re-evaluation of patient's condition.   Procedures   MEDICATIONS ORDERED IN ED: Medications  sodium chloride  0.9 % bolus 1,000 mL (0 mLs Intravenous Stopped 12/12/23 1215)  lactated ringers  bolus 1,000 mL (0 mLs Intravenous Stopped 12/13/23 0112)     IMPRESSION / MDM / ASSESSMENT AND PLAN / ED COURSE  I reviewed the triage vital signs and the nursing notes.                              Differential diagnosis includes,  but is not limited to, reported intentional overdose of unknown substance, thus far salicylate acetaminophen  and lithium  levels are negative.  Lamictal  level sent, but will result in suspect a prolonged period of time.  Hemodynamics are normal but her mental status is quite somnolent arouses to sternal rub and noxious stimuli only at this time but she is protecting her airway well.  On arrival she was with elevated mood and had just received a second dose of anxiolytic medication by EMS, she then became somnolent thereafter.  At this point I suspect her somnolence may be due to overdose or a combination of sedating medication plus other coingestion etc.  Her workup is reassuring she is afebrile her white count is normal her head CT is normal.  Her metabolic panel is reassuring.  Will observe her closely.  She is on cardiac monitoring, under IVC for suspected intentional overdose  Poison control consulted, CK only minimally elevated.  Lactic acid normal  Repeat acetaminophen  and ECG are unremarkable  Repeat EKG inter by at 1330 heart rate 70 QRS 100 QTc 440 Normal sinus rhythm, no evidence of acute ischemia.   ----------------------------------------- 10:57 AM on  12/12/2023 -----------------------------------------   Patient's presentation is most consistent with acute presentation with potential threat to life or bodily function.   The patient is on the cardiac monitor to evaluate for evidence of arrhythmia and/or significant heart rate changes.   Clinical Course as of 12/13/23 1646  Sun Dec 12, 2023  1146 Continue to monitor closely, patient's somnolence is improving.  She now arouses to light tactile stimuli.  She is in no acute distress.  Opens eyes tracks examiner, then lays back going back towards sleep.  Appears to be improving from mental status perspective.  Continue to observe closely [MQ]    Clinical Course User Index [MQ] Dicky Anes, MD   ----------------------------------------- 2:57 PM on 12/12/2023 ----------------------------------------- Ongoing care assigned to Dr. Jossie.  Follow-up on close observation, cardiac monitoring, patient under IVC and will need psych consultation as well if medically clearing  FINAL CLINICAL IMPRESSION(S) / ED DIAGNOSES   Final diagnoses:  Depression, unspecified depression type  Borderline personality disorder (HCC)  Obtundation Altered Mentation   Rx / DC Orders   ED Discharge Orders     None        Note:  This document was prepared using Dragon voice recognition software and may include unintentional dictation errors.   Dicky Anes, MD 12/13/23 [phone]

## 2023-12-12 NOTE — ED Notes (Signed)
 Spoke with poison control, recommend adding mag, lactate and CK to labs, repeat tylenol  at 4 hrs EKG at 6 hrs. She is also seizure risk per her home meds. They said they have had cases where pt's who OD on baclofen  mimic brain death on exam (coma, loss of reflexes) EEG will show normal, but those resolve after a few days. Monitor until at baseline, give fluids prn for hypotension and O2/airway support as needed.

## 2023-12-12 NOTE — ED Notes (Signed)
 Pt refusing urine sample at this time, placed purewick

## 2023-12-12 NOTE — ED Notes (Signed)
 Notified by charge RN to move Pt in stretcher to Quad bed#22. Pt moved on telemetry and stretcher.

## 2023-12-12 NOTE — ED Notes (Signed)
 Pt refusing urine sample at this time. Purewick in place, still.

## 2023-12-12 NOTE — ED Notes (Signed)
 Pt BP has trended down. MD made aware.

## 2023-12-12 NOTE — ED Notes (Signed)
 Pt belongings placed on Quad cart.

## 2023-12-12 NOTE — BH Assessment (Signed)
 TTS unable to complete consult. Patient unable to participate in the interview.

## 2023-12-12 NOTE — ED Notes (Signed)
 IVC, pend psych consult, pt unable to participate in psych interview

## 2023-12-12 NOTE — ED Notes (Signed)
 Pt returned from CT, pt had voided on self, will change pants and sheet momentarily.

## 2023-12-12 NOTE — ED Notes (Signed)
 Pt still disoriented, falls asleep after only a few words. Does wake easily but very irritated when woken up.

## 2023-12-12 NOTE — Consult Note (Signed)
 [facility] Psychiatric Consult Initial  Patient Name: .[name]  MRN: [id number]  DOB: [birth year]  Consult Order details:  Orders (From admission, onward)     Start     Ordered   12/12/23 0934  CONSULT TO CALL ACT TEAM       Ordering Provider: Dicky Anes, MD  Provider:  (Not yet assigned)  Question:  Reason for Consult?  Answer:  ivc   12/12/23 0934   12/12/23 0934  IP CONSULT TO PSYCHIATRY       Ordering Provider: Dicky Anes, MD  Provider:  (Not yet assigned)  Question Answer Comment  Place call to: psych   Reason for Consult Consult   Diagnosis/Clinical Info for Consult: ivc, ? overdose      12/12/23 0934             Mode of Visit: Tele-visit Virtual Statement:TELE PSYCHIATRY ATTESTATION & CONSENT As the provider for this telehealth consult, I attest that I verified the patient's identity using two separate identifiers, introduced myself to the patient, provided my credentials, disclosed my location, and performed this encounter via a HIPAA-compliant, real-time, face-to-face, two-way, interactive audio and video platform and with the full consent and agreement of the patient (or guardian as applicable.) Patient physical location: [facility]. Telehealth provider physical location: home office in state of [place].   Video start time: 1023 Video end time: 1048    Psychiatry Consult Evaluation  Service Date: December 12, 2023 LOS:  LOS: 0 days  Chief Complaint SI  Primary Psychiatric Diagnoses    Suicidal behavior   Borderline personality disorder (HCC)   Morbid obesity (HCC)   Depression  Assessment  [name] is a 38 y.o. female admitted: Presented to the ED at 12/12/2023  8:54 AM for suicide attempt.  She carries the psychiatric diagnoses of Borderline personality disorder, Morbid obesity, Depression and Suicidal behavior.   Her current presentation of suicidal behavior is most consistent with under treated depression. She meets criteria for inpatient  hospitalization based on suicide attempt on admission.  Current outpatient psychotropic medications include effexor  175mg .  On initial examination, patient was a poor historian and states that she cannot remember anything.  She was able to recall her address, date of birth and number of children she has but was unable to provide the correct year.  Please see plan below for detailed recommendations.   Diagnoses:  Active Hospital problems: Principal Problem:   Suicidal behavior Active Problems:   Borderline personality disorder (HCC)   Morbid obesity (HCC)   Depression    Plan   ## Psychiatric Medication Recommendations:  Resume home med once reconciled  ## Medical Decision Making Capacity: Not specifically addressed in this encounter  ## Further Work-up:  [name] was admitted to [facility] for Suicidal behavior, crisis management, and stabilization. Routine labs ordered, which include  Lab Orders         Comprehensive metabolic panel         Salicylate level         Acetaminophen  level         Ethanol         Urine Drug Screen, Qualitative         CBC WITH DIFFERENTIAL         hCG, quantitative, pregnancy         Lithium  level         Urinalysis, Routine w reflex microscopic -Urine, Clean Catch  Lactic acid, plasma         CK         Magnesium          Lamotrigine  level         Acetaminophen  level         CBG monitoring, ED         CBG monitoring, ED    Medication Management: Restart home Medications once reconciled  Will maintain observation checks every 15 minutes for safety. Psychosocial education regarding relapse prevention and self-care; social and communication  Social work will consult with family for collateral information and discuss discharge and follow up plan.   ## Disposition:-- We recommend inpatient psychiatric hospitalization when medically cleared. Patient is under voluntary admission status at this time; please IVC if attempts to leave  hospital.  ## Behavioral / Environmental: - Patients with borderline personality traits/disorder often use the language of physical pain to communicate both physical and emotional suffering. It is important to address pain complaints as they arise and attempt to identify an etiology, either organic or psychiatric. In patients with chronic pain, it is important to have a discussion with the patient about expectations about pain control., Recommend using specific terminology regarding PNES, i.e. call the episodes non-epileptic seizures rather than pseudoseizures as the latter insinuates fake or feigned symptoms, when the events are a very real experience to the patient and are a physical, non-volitional, manifestation of fear, pain and anxiety. , or To minimize splitting of staff, assign one staff person to communicate all information from the team when feasible.    ## Safety and Observation Level:  - Based on my clinical evaluation, I estimate the patient to be at moderate risk of self harm in the current setting. - At this time, we recommend  routine. This decision is based on my review of the chart including patient's history and current presentation, interview of the patient, mental status examination, and consideration of suicide risk including evaluating suicidal ideation, plan, intent, suicidal or self-harm behaviors, risk factors, and protective factors. This judgment is based on our ability to directly address suicide risk, implement suicide prevention strategies, and develop a safety plan while the patient is in the clinical setting. Please contact our team if there is a concern that risk level has changed.  CSSR Risk Category:   Suicide Risk Assessment: Patient has following modifiable risk factors for suicide: active suicidal ideation, under treated depression , and recklessness, which we are addressing by recommending inpatient hospitalization. Patient has following non-modifiable or  demographic risk factors for suicide: history of self harm behavior and psychiatric hospitalization Patient has the following protective factors against suicide: Access to outpatient mental health care  Thank you for this consult request. Recommendations have been communicated to the primary team.  We will recommend inpatient hospitalization at this time.   Madelene CHRISTELLA Fireman, NP       History of Present Illness  Relevant Aspects of Hospital ED Course:  Admitted on 12/12/2023 for suicide attempt.   Patient Report:  When asked what happened today, patient reports that she doesn't know what happened. Per triage notes, PT BIB [facility] for possible drug overdose. Pt is from home, per EMS and police pt has only been screaming, and yelling please stop. Pt does have a hx of OD per EMS and is a heroin user. Per EMS, mom states she thinks pt took an entire bottle of one of her home meds. EMS arrives with two full bottles of Venlafaxine , they  believe were recently filled.  Pt given a total of 5mg  Versed  and 2mg  Haldol .  Psych ROS:  Depression: denies Anxiety:  denies Mania (lifetime and current): Psychosis:   Collateral information:   Chart review, EMS report   ROS   Psychiatric and Social History  Psychiatric History:  Information collected from patient, chart review   Exam Findings  Physical Exam:   Vital Signs:  Temp:  [98 F (36.7 C)-98.4 F (36.9 C)] 98 F (36.7 C) (01/05 2130) Pulse Rate:  [72-85] 83 (01/05 2130) Resp:  [14-18] 16 (01/05 2130) BP: (92-137)/(55-99) 107/76 (01/05 2130) SpO2:  [94 %-100 %] 96 % (01/05 2130) Blood pressure 107/76, pulse 83, temperature 98 F (36.7 C), temperature source Oral, resp. rate 16, height 5' 4 (1.626 m), SpO2 96%. Body mass index is 37.21 kg/m.  Physical Exam  Mental Status Exam: General Appearance: Disheveled  Orientation:  Other:  Partial  Memory:  Immediate;   Fair Recent;   Fair  Concentration:  Concentration: Fair and Attention  Span: Fair  Recall:  Poor  Attention  Poor  Eye Contact:  Fair  Speech:  Clear and Coherent  Language:  Fair  Volume:  Normal  Mood: Labile   Affect:  Flat and Restricted  Thought Process:  Descriptions of Associations: Circumstantial  Thought Content:   unable to assess  Suicidal Thoughts:  Yes.  with intent/plan  Homicidal Thoughts:  No  Judgement:  Impaired  Insight:  Negative  Psychomotor Activity:  Normal  Akathisia:  NA  Fund of Knowledge:  Poor      Assets:  Architect Housing  Cognition:  Impaired,  Moderate  ADL's:  Intact  AIMS (if indicated):        Other History   These have been pulled in through the EMR, reviewed, and updated if appropriate.  Family History:  The patient's family history includes ADD / ADHD in her son; Anxiety disorder in her sister; Arthritis in her mother; Cancer in her father and mother; Coronary artery disease in her mother; Coronary artery disease (age of onset: 72) in her maternal grandfather; Depression in her sister; Heart attack (age of onset: 35) in her maternal grandfather; Hyperlipidemia in her mother.  Medical History: Past Medical History:  Diagnosis Date   Borderline personality disorder (HCC) 04/16/2013   Cannabis abuse 2014   Chondromalacia patellae, right knee    Depression    Well-controlled with Zoloft    Gestational diabetes    Has prediabetes now.   Headache    Heroin abuse (HCC)    History of drug use 12/15/2017   IDA (iron deficiency anemia)    Migraines    Morbid obesity with BMI of 40.0-44.9, adult (HCC)    Nicotine  dependence with current use 06/26/2021   Osteoarthritis, knee     Surgical History: Past Surgical History:  Procedure Laterality Date   ANKLE SURGERY Left 12/07/2010   CESAREAN SECTION  12/07/2009   CESAREAN SECTION WITH BILATERAL TUBAL LIGATION Bilateral 06/15/2018   Procedure: CESAREAN SECTION WITH BILATERAL TUBAL LIGATION;  Surgeon: Lovetta Debby PARAS, MD;  Location: [facility] ORS;  Service: Obstetrics;  Laterality: Bilateral;   CHOLECYSTECTOMY N/A 09/27/2016   Procedure: LAPAROSCOPIC CHOLECYSTECTOMY WITH INTRAOPERATIVE CHOLANGIOGRAM;  Surgeon: Charlie FORBES Fell, MD;  Location: [facility] ORS;  Service: General;  Laterality: N/A;   DILATION AND CURETTAGE OF UTERUS  12/08/2011   KNEE SURGERY Right 08/05/2022   TONSILLECTOMY  12/07/2005     Medications:  No current facility-administered medications  for this encounter.  Current Outpatient Medications:    albuterol  (VENTOLIN  HFA) 108 (90 Base) MCG/ACT inhaler, INHALE 2 PUFFS INTO THE LUNGS EVERY 6 HOURS AS NEEDED FOR WHEEZING OR SHORTNESS OF BREATH, Disp: 8 g, Rfl: 0   baclofen  (LIORESAL ) 20 MG tablet, Take 1 tablet (20 mg total) by mouth 3 (three) times daily., Disp: 90 each, Rfl: 2   lamoTRIgine  (LAMICTAL ) 150 MG tablet, Take 150 mg by mouth daily., Disp: , Rfl:    LORazepam  (ATIVAN ) 1 MG tablet, Take 1 mg by mouth 2 (two) times daily., Disp: , Rfl:    venlafaxine  (EFFEXOR ) 100 MG tablet, Take 100 mg by mouth daily., Disp: , Rfl:    venlafaxine  (EFFEXOR ) 75 MG tablet, Take 75 mg by mouth daily., Disp: , Rfl:   Allergies: Allergies  Allergen Reactions   Clindamycin/Lincomycin Anaphylaxis and Rash   Gabapentin Swelling    Leg swelling   Amoxicillin Rash    Has patient had a PCN reaction causing immediate rash, facial/tongue/throat swelling, SOB or lightheadedness with hypotension: Yes Has patient had a PCN reaction causing severe rash involving mucus membranes or skin necrosis: No Has patient had a PCN reaction that required hospitalization: No Has patient had a PCN reaction occurring within the last 10 years: No If all of the above answers are NO, then may proceed with Cephalosporin use.     Madelene CHRISTELLA Fireman, NP

## 2023-12-12 NOTE — ED Notes (Signed)
 Pt waking to voice, not saying much before returning to sleep. Pt told would let sleep a little longer and she nodded to acknowledge.

## 2023-12-12 NOTE — ED Notes (Addendum)
 Pt is now awake. Pt was assisted to bathroom. Pt urinated but missed my hat. Will attempt a recollect later.  Pt dressed out: Pink bra Black shirt White tank top 4 yellow metal earrings.  Nose ring and face dermal piercing unable to come out.   Pt is caox4, doesn't understand why she is here and doesn't remember any of the events that happened. Pt currently denies SI and states I would never want to kill myself. Psych made aware that she is now awake.

## 2023-12-13 ENCOUNTER — Encounter: Payer: Self-pay | Admitting: Psychiatric/Mental Health

## 2023-12-13 ENCOUNTER — Inpatient Hospital Stay
Admission: AD | Admit: 2023-12-13 | Discharge: 2023-12-16 | DRG: 885 | Disposition: A | Discharge status: Home / Self Care | Payer: MEDICAID | Source: Intra-hospital | Attending: Psychiatry | Admitting: Psychiatry

## 2023-12-13 ENCOUNTER — Other Ambulatory Visit: Payer: Self-pay

## 2023-12-13 ENCOUNTER — Encounter: Payer: Self-pay | Admitting: Oncology

## 2023-12-13 DIAGNOSIS — F17290 Nicotine dependence, other tobacco product, uncomplicated: Secondary | ICD-10-CM | POA: Diagnosis present

## 2023-12-13 DIAGNOSIS — F603 Borderline personality disorder: Secondary | ICD-10-CM | POA: Diagnosis present

## 2023-12-13 DIAGNOSIS — F17210 Nicotine dependence, cigarettes, uncomplicated: Secondary | ICD-10-CM | POA: Diagnosis present

## 2023-12-13 DIAGNOSIS — F17200 Nicotine dependence, unspecified, uncomplicated: Secondary | ICD-10-CM | POA: Diagnosis present

## 2023-12-13 DIAGNOSIS — F1210 Cannabis abuse, uncomplicated: Secondary | ICD-10-CM | POA: Diagnosis present

## 2023-12-13 DIAGNOSIS — F909 Attention-deficit hyperactivity disorder, unspecified type: Secondary | ICD-10-CM | POA: Diagnosis present

## 2023-12-13 DIAGNOSIS — F319 Bipolar disorder, unspecified: Principal | ICD-10-CM

## 2023-12-13 DIAGNOSIS — T424X1A Poisoning by benzodiazepines, accidental (unintentional), initial encounter: Secondary | ICD-10-CM | POA: Diagnosis present

## 2023-12-13 DIAGNOSIS — Z8249 Family history of ischemic heart disease and other diseases of the circulatory system: Secondary | ICD-10-CM

## 2023-12-13 DIAGNOSIS — F1110 Opioid abuse, uncomplicated: Secondary | ICD-10-CM | POA: Diagnosis present

## 2023-12-13 DIAGNOSIS — F1991 Other psychoactive substance use, unspecified, in remission: Secondary | ICD-10-CM | POA: Diagnosis present

## 2023-12-13 DIAGNOSIS — R451 Restlessness and agitation: Secondary | ICD-10-CM | POA: Diagnosis present

## 2023-12-13 DIAGNOSIS — F32A Depression, unspecified: Secondary | ICD-10-CM | POA: Diagnosis present

## 2023-12-13 DIAGNOSIS — G8929 Other chronic pain: Secondary | ICD-10-CM | POA: Diagnosis present

## 2023-12-13 DIAGNOSIS — N3000 Acute cystitis without hematuria: Secondary | ICD-10-CM | POA: Diagnosis not present

## 2023-12-13 DIAGNOSIS — Z83438 Family history of other disorder of lipoprotein metabolism and other lipidemia: Secondary | ICD-10-CM | POA: Diagnosis not present

## 2023-12-13 DIAGNOSIS — F1410 Cocaine abuse, uncomplicated: Secondary | ICD-10-CM | POA: Diagnosis present

## 2023-12-13 DIAGNOSIS — T50901S Poisoning by unspecified drugs, medicaments and biological substances, accidental (unintentional), sequela: Secondary | ICD-10-CM | POA: Diagnosis not present

## 2023-12-13 DIAGNOSIS — F419 Anxiety disorder, unspecified: Secondary | ICD-10-CM | POA: Diagnosis present

## 2023-12-13 DIAGNOSIS — Z8261 Family history of arthritis: Secondary | ICD-10-CM

## 2023-12-13 DIAGNOSIS — Z888 Allergy status to other drugs, medicaments and biological substances status: Secondary | ICD-10-CM

## 2023-12-13 DIAGNOSIS — Z801 Family history of malignant neoplasm of trachea, bronchus and lung: Secondary | ICD-10-CM

## 2023-12-13 DIAGNOSIS — T43211A Poisoning by selective serotonin and norepinephrine reuptake inhibitors, accidental (unintentional), initial encounter: Principal | ICD-10-CM

## 2023-12-13 DIAGNOSIS — Z808 Family history of malignant neoplasm of other organs or systems: Secondary | ICD-10-CM | POA: Diagnosis not present

## 2023-12-13 DIAGNOSIS — Z881 Allergy status to other antibiotic agents status: Secondary | ICD-10-CM

## 2023-12-13 DIAGNOSIS — R7303 Prediabetes: Secondary | ICD-10-CM | POA: Diagnosis present

## 2023-12-13 DIAGNOSIS — T50901A Poisoning by unspecified drugs, medicaments and biological substances, accidental (unintentional), initial encounter: Secondary | ICD-10-CM

## 2023-12-13 DIAGNOSIS — G4700 Insomnia, unspecified: Secondary | ICD-10-CM | POA: Diagnosis present

## 2023-12-13 DIAGNOSIS — R410 Disorientation, unspecified: Secondary | ICD-10-CM | POA: Diagnosis present

## 2023-12-13 DIAGNOSIS — Z6841 Body Mass Index (BMI) 40.0 and over, adult: Secondary | ICD-10-CM | POA: Diagnosis not present

## 2023-12-13 DIAGNOSIS — M549 Dorsalgia, unspecified: Secondary | ICD-10-CM | POA: Diagnosis present

## 2023-12-13 DIAGNOSIS — Z79899 Other long term (current) drug therapy: Secondary | ICD-10-CM | POA: Diagnosis not present

## 2023-12-13 DIAGNOSIS — Z880 Allergy status to penicillin: Secondary | ICD-10-CM

## 2023-12-13 DIAGNOSIS — Z818 Family history of other mental and behavioral disorders: Secondary | ICD-10-CM

## 2023-12-13 DIAGNOSIS — F3170 Bipolar disorder, currently in remission, most recent episode unspecified: Secondary | ICD-10-CM | POA: Diagnosis not present

## 2023-12-13 DIAGNOSIS — N760 Acute vaginitis: Secondary | ICD-10-CM | POA: Diagnosis not present

## 2023-12-13 DIAGNOSIS — B9689 Other specified bacterial agents as the cause of diseases classified elsewhere: Secondary | ICD-10-CM | POA: Diagnosis not present

## 2023-12-13 LAB — URINALYSIS, ROUTINE W REFLEX MICROSCOPIC
Bilirubin Urine: NEGATIVE
Glucose, UA: NEGATIVE mg/dL
Hgb urine dipstick: NEGATIVE
Ketones, ur: NEGATIVE mg/dL
Nitrite: NEGATIVE
Protein, ur: 30 mg/dL — AB
Specific Gravity, Urine: 1.016 (ref 1.005–1.030)
WBC, UA: >50 WBC/hpf (ref 0–5)
pH: 5 (ref 5.0–8.0)

## 2023-12-13 LAB — URINE DRUG SCREEN, QUALITATIVE (ARMC ONLY)
Amphetamines, Ur Screen: NOT DETECTED
Barbiturates, Ur Screen: NOT DETECTED
Benzodiazepine, Ur Scrn: POSITIVE — AB
Cannabinoid 50 Ng, Ur ~~LOC~~: POSITIVE — AB
Cocaine Metabolite,Ur ~~LOC~~: NOT DETECTED
MDMA (Ecstasy)Ur Screen: NOT DETECTED
Methadone Scn, Ur: NOT DETECTED
Opiate, Ur Screen: NOT DETECTED
Phencyclidine (PCP) Ur S: NOT DETECTED
Tricyclic, Ur Screen: POSITIVE — AB

## 2023-12-13 LAB — LAMOTRIGINE LEVEL: Lamotrigine Lvl: 1.2 ug/mL — ABNORMAL LOW (ref 2.0–20.0)

## 2023-12-13 MED ORDER — CLONAZEPAM 0.25 MG PO TBDP
0.1250 mg | ORAL_TABLET | Freq: Two times a day (BID) | ORAL | Status: AC
  Administered 2023-12-15 – 2023-12-16 (×2): 0.125 mg via ORAL
  Filled 2023-12-13 (×2): qty 1

## 2023-12-13 MED ORDER — TRAZODONE HCL 50 MG PO TABS: 50.0000 mg | ORAL_TABLET | Freq: Every evening | ORAL | Status: DC | PRN

## 2023-12-13 MED ORDER — CLONAZEPAM 0.5 MG PO TABS
0.5000 mg | ORAL_TABLET | Freq: Two times a day (BID) | ORAL | Status: DC
  Filled 2023-12-13: qty 1

## 2023-12-13 MED ORDER — DIPHENHYDRAMINE HCL 50 MG/ML IJ SOLN: 50.0000 mg | Freq: Three times a day (TID) | INTRAMUSCULAR | Status: DC | PRN

## 2023-12-13 MED ORDER — ACETAMINOPHEN 325 MG PO TABS: 650.0000 mg | ORAL_TABLET | Freq: Four times a day (QID) | ORAL | Status: DC | PRN

## 2023-12-13 MED ORDER — HALOPERIDOL LACTATE 5 MG/ML IJ SOLN: 5.0000 mg | Freq: Three times a day (TID) | INTRAMUSCULAR | Status: DC | PRN

## 2023-12-13 MED ORDER — MELATONIN 5 MG PO TABS
5.0000 mg | ORAL_TABLET | Freq: Every day | ORAL | Status: DC
  Administered 2023-12-13 – 2023-12-15 (×3): 5 mg via ORAL
  Filled 2023-12-13 (×3): qty 1

## 2023-12-13 MED ORDER — LORAZEPAM 2 MG/ML IJ SOLN: 2.0000 mg | Freq: Three times a day (TID) | INTRAMUSCULAR | Status: DC | PRN

## 2023-12-13 MED ORDER — DIPHENHYDRAMINE HCL 25 MG PO CAPS: 50.0000 mg | ORAL_CAPSULE | Freq: Three times a day (TID) | ORAL | Status: DC | PRN

## 2023-12-13 MED ORDER — ALUM & MAG HYDROXIDE-SIMETH 200-200-20 MG/5ML PO SUSP: 30.0000 mL | ORAL | Status: DC | PRN

## 2023-12-13 MED ORDER — HALOPERIDOL LACTATE 5 MG/ML IJ SOLN: 10.0000 mg | Freq: Three times a day (TID) | INTRAMUSCULAR | Status: DC | PRN

## 2023-12-13 MED ORDER — AMITRIPTYLINE HCL 10 MG PO TABS
10.0000 mg | ORAL_TABLET | Freq: Every day | ORAL | Status: DC
  Administered 2023-12-13: 10 mg via ORAL
  Filled 2023-12-13: qty 1

## 2023-12-13 MED ORDER — HALOPERIDOL 5 MG PO TABS: 5.0000 mg | ORAL_TABLET | Freq: Three times a day (TID) | ORAL | Status: DC | PRN

## 2023-12-13 MED ORDER — NICOTINE 7 MG/24HR TD PT24
7.0000 mg | MEDICATED_PATCH | Freq: Every day | TRANSDERMAL | Status: DC
  Filled 2023-12-13 (×2): qty 1

## 2023-12-13 MED ORDER — MAGNESIUM HYDROXIDE 400 MG/5ML PO SUSP
30.0000 mL | Freq: Every day | ORAL | Status: DC | PRN
  Administered 2023-12-14: 30 mL via ORAL
  Filled 2023-12-13: qty 30

## 2023-12-13 MED ORDER — LIDOCAINE 5 % EX PTCH
1.0000 | MEDICATED_PATCH | CUTANEOUS | Status: DC
  Administered 2023-12-13 – 2023-12-15 (×3): 1 via TRANSDERMAL
  Filled 2023-12-13 (×3): qty 1

## 2023-12-13 MED ORDER — CLONAZEPAM 0.25 MG PO TBDP
1.0000 mg | ORAL_TABLET | Freq: Two times a day (BID) | ORAL | Status: AC
  Administered 2023-12-13: 1 mg via ORAL
  Filled 2023-12-13: qty 4

## 2023-12-13 NOTE — Progress Notes (Signed)
   12/13/23 1350  Charting Type  Charting Type Admission  Safety Check Verification  Has the RN verified the 15 minute safety check completion? Yes  Neurological  Neuro (WDL) WDL  Orientation Level Oriented X4  HEENT  HEENT (WDL) WDL  Respiratory  Respiratory (WDL) X (hx. Asthma)  Cardiac  Cardiac (WDL) WDL  Vascular  Vascular (WDL) WDL  Integumentary  Integumentary (WDL) WDL (multiple tattoos: upper/left back, lower back, right side torso, bilateral foot tattoos)  Staff Member Assisting with Skin Assessment on Admission Monica, RN  Braden Scale (Ages 8 and up)  Sensory Perceptions 4  Moisture 4  Activity 4  Mobility 4  Nutrition 3  Friction and Shear 3  Braden Scale Score 22  Musculoskeletal  Musculoskeletal (WDL) WDL  Gastrointestinal  Gastrointestinal (WDL) WDL  Last BM Date  12/12/23  GU Assessment  Genitourinary (WDL) WDL  Neurological  Level of Consciousness Alert

## 2023-12-13 NOTE — Plan of Care (Signed)
 New admission.  Problem: Education: Goal: Knowledge of [facility] Education information/materials will improve Outcome: Not Progressing Goal: Emotional status will improve Outcome: Not Progressing Goal: Mental status will improve Outcome: Not Progressing Goal: Verbalization of understanding the information provided will improve Outcome: Not Progressing   Problem: Activity: Goal: Interest or engagement in activities will improve Outcome: Not Progressing Goal: Sleeping patterns will improve Outcome: Not Progressing   Problem: Coping: Goal: Ability to verbalize frustrations and anger appropriately will improve Outcome: Not Progressing Goal: Ability to demonstrate self-control will improve Outcome: Not Progressing   Problem: Health Behavior/Discharge Planning: Goal: Identification of resources available to assist in meeting health care needs will improve Outcome: Not Progressing Goal: Compliance with treatment plan for underlying cause of condition will improve Outcome: Not Progressing   Problem: Physical Regulation: Goal: Ability to maintain clinical measurements within normal limits will improve Outcome: Not Progressing   Problem: Safety: Goal: Periods of time without injury will increase Outcome: Not Progressing   Problem: Coping: Goal: Coping ability will improve Outcome: Not Progressing   Problem: Health Behavior/Discharge Planning: Goal: Identification of resources available to assist in meeting health care needs will improve Outcome: Not Progressing   Problem: Self-Concept: Goal: Ability to disclose and discuss suicidal ideas will improve Outcome: Not Progressing

## 2023-12-13 NOTE — Group Note (Signed)
 LCSW Group Therapy Note   Group Date: 12/13/2023 Start Time: 1300 End Time: 1400   Type of Therapy and Topic:  Group Therapy: Challenging Core Beliefs  Participation Level:  Active  Description of Group:  Patients were educated about core beliefs and asked to identify one harmful core belief that they have. Patients were asked to explore from where those beliefs originate. Patients were asked to discuss how those beliefs make them feel and the resulting behaviors of those beliefs. They were then be asked if those beliefs are true and, if so, what evidence they have to support them. Lastly, group members were challenged to replace those negative core beliefs with helpful beliefs.   Therapeutic Goals:   1. Patient will identify harmful core beliefs and explore the origins of such beliefs. 2. Patient will identify feelings and behaviors that result from those core beliefs. 3. Patient will discuss whether such beliefs are true. 4.  Patient will replace harmful core beliefs with helpful ones.  Summary of Patient Progress:  Patient actively engaged in processing and exploring how core beliefs are formed and how they impact thoughts, feelings, and behaviors. Patient proved open to input from peers and feedback from CSW. Patient demonstrated proficient  insight into the subject matter, was respectful and supportive of peers, and participated throughout the entire session.  Therapeutic Modalities: Cognitive Behavioral Therapy; Solution-Focused Therapy   Tokiko Diefenderfer M Dally Oshel, LCSWA 12/13/2023  2:16 PM

## 2023-12-13 NOTE — Progress Notes (Signed)
   12/13/23 1350  Psych Admission Type (Psych Patients Only)  Admission Status Involuntary  Psychosocial Assessment  Patient Complaints Confusion (patient is unsure why she is here)  Eye Contact Fair  Facial Expression Anxious;Worried  Affect Anxious;Preoccupied (with getting her belongings)  Solicitor Assertive  Motor Activity Slow  Appearance/Hygiene In scrubs  Behavior Characteristics Cooperative;Appropriate to situation  Mood Preoccupied;Pleasant  Aggressive Behavior  Effect No apparent injury  Thought Process  Coherency WDL  Content Blaming others (patient is blaming her mother)  Delusions None reported or observed  Perception WDL  Hallucination None reported or observed  Judgment Impaired  Confusion Mild  Danger to Self  Current suicidal ideation? Denies  Danger to Others  Danger to Others None reported or observed

## 2023-12-13 NOTE — H&P (Addendum)
 Psychiatric Admission Assessment Adult  Patient Identification: [name] MRN:  [id number] Date of Evaluation:  12/13/2023 Chief Complaint:  Depression [F32.A] Principal Diagnosis: Overdose of venlafaxine  Diagnosis:  Principal Problem:   Overdose of venlafaxine  Active Problems:   Borderline personality disorder (HCC)   Cannabis abuse   History of drug use   Nicotine  dependence with current use   Depression   Back pain  History of Present Illness: 38 year old Caucasian female with a psychiatric history significant for Borderline Personality Disorder, Major Depressive Disorder, and a history of suicidal behavior. She was brought to the emergency department via EMS under IVC after a suspected overdose on Effexor  (Venlafaxine ) and Ativan  (Lorazepam ). According to EMS and family, the patient had been screaming and yelling, "please stop," and was found in possession of two recently filled bottles of Effexor , raising concerns about intentional overdose.The patient reports chronic back pain and states she takes THC gummies to help with sleep. She denies any recent trauma or falls but is unable to recall the events leading to her hospital admission, stating, "I don't remember what happened last night." The patient denies current suicidal ideation or hallucinations but reports difficulty sleeping and generalized pain.She is a poor historian, inconsistently oriented (stating the year as 2005), and unable to provide details about the timeline of events. The patient exhibits signs of poor insight and is resistant to hospitalization, expressing a desire to just go home. Collateral information from her mother indicates concerns regarding the patient's recent behavior, including emotional dysregulation, neglect of self-care, and potential substance misuse. The patient tested positive for benzodiazepines and cannabinoids on admission. Associated Signs/Symptoms: Depression Symptoms:  insomnia, difficulty  concentrating, suicidal attempt, anxiety, loss of energy/fatigue, disturbed sleep, (Hypo) Manic Symptoms:  Impulsivity, Anxiety Symptoms:  Excessive Worry, Psychotic Symptoms:   denies PTSD Symptoms: Negative Total Time spent with patient: 2 hours  Past Psychiatric History: Borderline Personality Disorder, Morbid Obesity, Depression, ADHD, Cannabis Abuse, Cocaine Abuse.  Is the patient at risk to self? Yes.    Has the patient been a risk to self in the past 6 months? Yes.    Has the patient been a risk to self within the distant past? Yes.    Is the patient a risk to others? No.  Has the patient been a risk to others in the past 6 months? No.  Has the patient been a risk to others within the distant past? No.   Columbia Scale:  Flowsheet Row ED from 11/01/2023 in [facility] Emergency Department at [facility] ED from 03/29/2022 in [facility] Emergency Department at [facility] ED from 12/30/2021 in [facility] Emergency Department at [facility]  C-SSRS RISK CATEGORY No Risk No Risk No Risk        Prior Inpatient Therapy: Yes.   If yes, describe Once before  Prior Outpatient Therapy: Yes.   If yes, describe rha   Alcohol Screening: 1. How often do you have a drink containing alcohol?: Never 2. How many drinks containing alcohol do you have on a typical day when you are drinking?: 1 or 2 3. How often do you have six or more drinks on one occasion?: Never AUDIT-C Score: 0 4. How often during the last year have you found that you were not able to stop drinking once you had started?: Never 5. How often during the last year have you failed to do what was normally expected from you because of drinking?: Never 6. How often during the last year have  you needed a first drink in the morning to get yourself going after a heavy drinking session?: Never 7. How often during the last year have you had a feeling of guilt of remorse after drinking?: Never 8. How often during  the last year have you been unable to remember what happened the night before because you had been drinking?: Never 9. Have you or someone else been injured as a result of your drinking?: No 10. Has a relative or friend or a doctor or another health worker been concerned about your drinking or suggested you cut down?: No Alcohol Use Disorder Identification Test Final Score (AUDIT): 0 Substance Abuse History in the last 12 months:  Yes.   Consequences of Substance Abuse: Blackouts:    Previous Psychotropic Medications: Yes  Psychological Evaluations: Yes  Past Medical History:  Past Medical History:  Diagnosis Date   Borderline personality disorder (HCC) 04/16/2013   Cannabis abuse 2014   Chondromalacia patellae, right knee    Depression    Well-controlled with Zoloft    Gestational diabetes    Has prediabetes now.   Headache    Heroin abuse (HCC)    History of drug use 12/15/2017   IDA (iron deficiency anemia)    Migraines    Morbid obesity with BMI of 40.0-44.9, adult (HCC)    Nicotine  dependence with current use 06/26/2021   Osteoarthritis, knee     Past Surgical History:  Procedure Laterality Date   ANKLE SURGERY Left 12/07/2010   CESAREAN SECTION  12/07/2009   CESAREAN SECTION WITH BILATERAL TUBAL LIGATION Bilateral 06/15/2018   Procedure: CESAREAN SECTION WITH BILATERAL TUBAL LIGATION;  Surgeon: Lovetta Debby PARAS, MD;  Location: [facility] ORS;  Service: Obstetrics;  Laterality: Bilateral;   CHOLECYSTECTOMY N/A 09/27/2016   Procedure: LAPAROSCOPIC CHOLECYSTECTOMY WITH INTRAOPERATIVE CHOLANGIOGRAM;  Surgeon: Charlie FORBES Fell, MD;  Location: [facility] ORS;  Service: General;  Laterality: N/A;   DILATION AND CURETTAGE OF UTERUS  12/08/2011   KNEE SURGERY Right 08/05/2022   TONSILLECTOMY  12/07/2005   Family History:  Family History  Problem Relation Age of Onset   Arthritis Mother    Cancer Mother        skin   Hyperlipidemia Mother        takes meds   Coronary artery  disease Mother        non-obstructive   Cancer Father        Lung   Anxiety disorder Sister    Depression Sister    ADD / ADHD Son    Heart attack Maternal Grandfather 32   Coronary artery disease Maternal Grandfather 13       s/p CABG   Family Psychiatric  History: see above Tobacco Screening:  Social History   Tobacco Use  Smoking Status Former   Current packs/day: 1.00   Average packs/day: 1 pack/day for 13.0 years (13.0 ttl pk-yrs)   Types: Cigarettes  Smokeless Tobacco Never    BH Tobacco Counseling     Are you interested in Tobacco Cessation Medications?  Yes, implement Nicotene Replacement Protocol Counseled patient on smoking cessation:  Refused/Declined practical counseling Reason Tobacco Screening Not Completed: No value filed.       Social History:  Social History   Substance and Sexual Activity  Alcohol Use Yes     Social History   Substance and Sexual Activity  Drug Use Not Currently   Comment: history of heroin and marijuana use    Additional Social History: Marital status: Divorced Divorced, when?:  2016 What is your sexual orientation?: men Has your sexual activity been affected by drugs, alcohol, medication, or emotional stress?: no. I just don't go anywhere Does patient have children?: Yes How many children?: 2 How is patient's relationship with their children?: good                         Allergies:   Allergies  Allergen Reactions   Clindamycin/Lincomycin Anaphylaxis and Rash   Gabapentin Swelling    Leg swelling   Amoxicillin Rash    Has patient had a PCN reaction causing immediate rash, facial/tongue/throat swelling, SOB or lightheadedness with hypotension: Yes Has patient had a PCN reaction causing severe rash involving mucus membranes or skin necrosis: No Has patient had a PCN reaction that required hospitalization: No Has patient had a PCN reaction occurring within the last 10 years: No If all of the above  answers are NO, then may proceed with Cephalosporin use.    Lab Results:  Results for orders placed or performed during the hospital encounter of 12/12/23 (from the past 48 hours)  Comprehensive metabolic panel     Status: Abnormal   Collection Time: 12/12/23  9:11 AM  Result Value Ref Range   Sodium 138 135 - 145 mmol/L   Potassium 3.2 (L) 3.5 - 5.1 mmol/L   Chloride 105 98 - 111 mmol/L   CO2 23 22 - 32 mmol/L   Glucose, Bld 150 (H) 70 - 99 mg/dL    Comment: Glucose reference range applies only to samples taken after fasting for at least 8 hours.   BUN 10 6 - 20 mg/dL   Creatinine, Ser 9.27 0.44 - 1.00 mg/dL   Calcium 8.7 (L) 8.9 - 10.3 mg/dL   Total Protein 6.9 6.5 - 8.1 g/dL   Albumin 4.1 3.5 - 5.0 g/dL   AST 25 15 - 41 U/L   ALT 29 0 - 44 U/L   Alkaline Phosphatase 73 38 - 126 U/L   Total Bilirubin 0.7 0.0 - 1.2 mg/dL   GFR, Estimated >39 >39 mL/min    Comment: (NOTE) Calculated using the CKD-EPI Creatinine Equation (2021)    Anion gap 10 5 - 15    Comment: Performed at [facility], [address] [place], [place] [address]  Salicylate level     Status: Abnormal   Collection Time: 12/12/23  9:11 AM  Result Value Ref Range   Salicylate Lvl <7.0 (L) 7.0 - 30.0 mg/dL    Comment: Performed at [facility], [address] [place], [place] [address]  Acetaminophen  level     Status: Abnormal   Collection Time: 12/12/23  9:11 AM  Result Value Ref Range   Acetaminophen  (Tylenol ), Serum <10 (L) 10 - 30 ug/mL    Comment: (NOTE) Therapeutic concentrations vary significantly. A range of 10-30 ug/mL  may be an effective concentration for many patients. However, some  are best treated at concentrations outside of this range. Acetaminophen  concentrations >150 ug/mL at 4 hours after ingestion  and >50 ug/mL at 12 hours after ingestion are often associated with  toxic reactions.  Performed at [facility], [address] [place], [place]  [address]   Ethanol     Status: None   Collection Time: 12/12/23  9:11 AM  Result Value Ref Range   Alcohol, Ethyl (B) <10 <10 mg/dL    Comment: (NOTE) Lowest detectable limit for serum alcohol is 10 mg/dL.  For medical purposes only.  Performed at [facility], [address] [place], [place] [address]   CBC WITH DIFFERENTIAL     Status: None   Collection Time: 12/12/23  9:11 AM  Result Value Ref Range   WBC 7.2 4.0 - 10.5 K/uL   RBC 4.96 3.87 - 5.11 MIL/uL   Hemoglobin 14.3 12.0 - 15.0 g/dL   HCT 56.8 63.9 - 53.9 %   MCV 86.9 80.0 - 100.0 fL   MCH 28.8 26.0 - 34.0 pg   MCHC 33.2 30.0 - 36.0 g/dL   RDW 86.8 88.4 - 84.4 %   Platelets 170 150 - 400 K/uL   nRBC 0.0 0.0 - 0.2 %   Neutrophils Relative % 65 %   Neutro Abs 4.7 1.7 - 7.7 K/uL   Lymphocytes Relative 30 %   Lymphs Abs 2.1 0.7 - 4.0 K/uL   Monocytes Relative 4 %   Monocytes Absolute 0.3 0.1 - 1.0 K/uL   Eosinophils Relative 1 %   Eosinophils Absolute 0.1 0.0 - 0.5 K/uL   Basophils Relative 0 %   Basophils Absolute 0.0 0.0 - 0.1 K/uL   Immature Granulocytes 0 %   Abs Immature Granulocytes 0.01 0.00 - 0.07 K/uL    Comment: Performed at [facility], [address] [place], [place] [address]  hCG, quantitative, pregnancy     Status: None   Collection Time: 12/12/23  9:11 AM  Result Value Ref Range   hCG, Beta Chain, Quant, S <1 <5 mIU/mL    Comment:          GEST. AGE      CONC.  (mIU/mL)   <=1 WEEK        5 - 50     2 WEEKS       50 - 500     3 WEEKS       100 - 10,000     4 WEEKS     1,000 - 30,000     5 WEEKS     3,500 - 115,000   6-8 WEEKS     12,000 - 270,000    12 WEEKS     15,000 - 220,000        FEMALE AND NON-PREGNANT FEMALE:     LESS THAN 5 mIU/mL Performed at [facility], [address] [place], [place] [address]   Lithium  level     Status: Abnormal   Collection Time: 12/12/23  9:11 AM  Result Value Ref Range   Lithium  Lvl <0.06 (L) 0.60 - 1.20 mmol/L    Comment:  Performed at [facility], [address] [place], [place] [address]  CK     Status: Abnormal   Collection Time: 12/12/23  9:11 AM  Result Value Ref Range   Total CK 349 (H) 38 - 234 U/L    Comment: Performed at [facility], [address] [place], [place] [address]  Magnesium      Status: None   Collection Time: 12/12/23  9:11 AM  Result Value Ref Range   Magnesium  2.3 1.7 - 2.4 mg/dL    Comment: Performed at [facility], [address] [place], [place] [address]  CBG monitoring, ED     Status: Abnormal   Collection Time: 12/12/23  9:31 AM  Result Value Ref Range   Glucose-Capillary 144 (H) 70 - 99 mg/dL    Comment: Glucose reference range applies only to samples taken after fasting for at least 8 hours.  Lactic acid,  plasma     Status: None   Collection Time: 12/12/23 10:42 AM  Result Value Ref Range   Lactic Acid, Venous 1.5 0.5 - 1.9 mmol/L    Comment: Performed at [facility], [address] [place], [place] [address]  Acetaminophen  level     Status: Abnormal   Collection Time: 12/12/23  1:42 PM  Result Value Ref Range   Acetaminophen  (Tylenol ), Serum <10 (L) 10 - 30 ug/mL    Comment: (NOTE) Therapeutic concentrations vary significantly. A range of 10-30 ug/mL  may be an effective concentration for many patients. However, some  are best treated at concentrations outside of this range. Acetaminophen  concentrations >150 ug/mL at 4 hours after ingestion  and >50 ug/mL at 12 hours after ingestion are often associated with  toxic reactions.  Performed at [facility], [address] [place], [place] [address]   Urine Drug Screen, Qualitative     Status: Abnormal   Collection Time: 12/13/23  2:44 AM  Result Value Ref Range   Tricyclic, Ur Screen POSITIVE (A) NONE DETECTED   Amphetamines, Ur Screen NONE DETECTED NONE DETECTED   MDMA (Ecstasy)Ur Screen NONE DETECTED NONE DETECTED   Cocaine Metabolite,Ur [place] NONE DETECTED  NONE DETECTED   Opiate, Ur Screen NONE DETECTED NONE DETECTED   Phencyclidine (PCP) Ur S NONE DETECTED NONE DETECTED   Cannabinoid 50 Ng, Ur [place] POSITIVE (A) NONE DETECTED   Barbiturates, Ur Screen NONE DETECTED NONE DETECTED   Benzodiazepine, Ur Scrn POSITIVE (A) NONE DETECTED   Methadone Scn, Ur NONE DETECTED NONE DETECTED    Comment: (NOTE) Tricyclics + metabolites, urine    Cutoff 1000 ng/mL Amphetamines + metabolites, urine  Cutoff 1000 ng/mL MDMA (Ecstasy), urine              Cutoff 500 ng/mL Cocaine Metabolite, urine          Cutoff 300 ng/mL Opiate + metabolites, urine        Cutoff 300 ng/mL Phencyclidine (PCP), urine         Cutoff 25 ng/mL Cannabinoid, urine                 Cutoff 50 ng/mL Barbiturates + metabolites, urine  Cutoff 200 ng/mL Benzodiazepine, urine              Cutoff 200 ng/mL Methadone, urine                   Cutoff 300 ng/mL  The urine drug screen provides only a preliminary, unconfirmed analytical test result and should not be used for non-medical purposes. Clinical consideration and professional judgment should be applied to any positive drug screen result due to possible interfering substances. A more specific alternate chemical method must be used in order to obtain a confirmed analytical result. Gas chromatography / mass spectrometry (GC/MS) is the preferred confirm atory method. Performed at [facility], [address] [place], [place] [address]   Urinalysis, Routine w reflex microscopic -Urine, Clean Catch     Status: Abnormal   Collection Time: 12/13/23  2:44 AM  Result Value Ref Range   Color, Urine YELLOW (A) YELLOW   APPearance CLOUDY (A) CLEAR   Specific Gravity, Urine 1.016 1.005 - 1.030   pH 5.0 5.0 - 8.0   Glucose, UA NEGATIVE NEGATIVE mg/dL   Hgb urine dipstick NEGATIVE NEGATIVE   Bilirubin Urine NEGATIVE NEGATIVE   Ketones, ur NEGATIVE NEGATIVE mg/dL   Protein, ur 30 (A) NEGATIVE mg/dL  Nitrite NEGATIVE NEGATIVE    Leukocytes,Ua LARGE (A) NEGATIVE   RBC / HPF 11-20 0 - 5 RBC/hpf   WBC, UA >50 0 - 5 WBC/hpf   Bacteria, UA RARE (A) NONE SEEN   Squamous Epithelial / HPF 0-5 0 - 5 /HPF   WBC Clumps PRESENT    Mucus PRESENT     Comment: Performed at [facility], [address] [place], [place] [address]    Blood Alcohol level:  Lab Results  Component Value Date   [facility] <10 12/12/2023    Metabolic Disorder Labs:  Lab Results  Component Value Date   HGBA1C 5.9 (H) 06/26/2021   Lab Results  Component Value Date   PROLACTIN 12.0 01/19/2022   Lab Results  Component Value Date   CHOL 227 (H) 08/05/2021   TRIG 215 (H) 08/05/2021   HDL 41 08/05/2021   CHOLHDL 5.5 (H) 08/05/2021   VLDL 70 (H) 07/29/2012   LDLCALC 147 (H) 08/05/2021   LDLCALC 98 07/29/2012    Current Medications: Current Facility-Administered Medications  Medication Dose Route Frequency Provider Last Rate Last Admin   acetaminophen  (TYLENOL ) tablet 650 mg  650 mg Oral Q6H PRN Melvenia Madelene HERO, NP       alum & mag hydroxide-simeth (MAALOX/MYLANTA) 200-200-20 MG/5ML suspension 30 mL  30 mL Oral Q4H PRN Dixon, Rashaun M, NP       amitriptyline  (ELAVIL ) tablet 10 mg  10 mg Oral QHS Nicholaus Brad RAMAN, NP       NOREEN ON 12/15/2023] clonazePAM  (KLONOPIN ) disintegrating tablet 0.125 mg  0.125 mg Oral BID Nicholaus Brad RAMAN, NP       haloperidol  (HALDOL ) tablet 5 mg  5 mg Oral TID PRN Melvenia Madelene HERO, NP       And   diphenhydrAMINE  (BENADRYL ) capsule 50 mg  50 mg Oral TID PRN Melvenia Madelene HERO, NP       haloperidol  lactate (HALDOL ) injection 5 mg  5 mg Intramuscular TID PRN Melvenia Madelene HERO, NP       And   diphenhydrAMINE  (BENADRYL ) injection 50 mg  50 mg Intramuscular TID PRN Melvenia Madelene HERO, NP       And   LORazepam  (ATIVAN ) injection 2 mg  2 mg Intramuscular TID PRN Melvenia Madelene HERO, NP       haloperidol  lactate (HALDOL ) injection 10 mg  10 mg Intramuscular TID PRN Melvenia Madelene HERO, NP       And   diphenhydrAMINE  (BENADRYL )  injection 50 mg  50 mg Intramuscular TID PRN Melvenia Madelene HERO, NP       And   LORazepam  (ATIVAN ) injection 2 mg  2 mg Intramuscular TID PRN Melvenia Madelene HERO, NP       lidocaine  (LIDODERM ) 5 % 1 patch  1 patch Transdermal Q24H Nicholaus Brad RAMAN, NP       magnesium  hydroxide (MILK OF MAGNESIA) suspension 30 mL  30 mL Oral Daily PRN Dixon, Rashaun M, NP       melatonin tablet 5 mg  5 mg Oral QHS Nicholaus Brad RAMAN, NP       nicotine  (NICODERM CQ  - dosed in mg/24 hr) patch 7 mg  7 mg Transdermal Daily Orla Jolliff S, NP       PTA Medications: Medications Prior to Admission  Medication Sig Dispense Refill Last Dose/Taking   albuterol  (VENTOLIN  HFA) 108 (90 Base) MCG/ACT inhaler INHALE 2 PUFFS INTO THE LUNGS EVERY 6 HOURS AS NEEDED FOR WHEEZING OR SHORTNESS OF BREATH  8 g 0    baclofen  (LIORESAL ) 20 MG tablet Take 1 tablet (20 mg total) by mouth 3 (three) times daily. 90 each 2    lamoTRIgine  (LAMICTAL ) 150 MG tablet Take 150 mg by mouth daily.      LORazepam  (ATIVAN ) 1 MG tablet Take 1 mg by mouth 2 (two) times daily.      venlafaxine  (EFFEXOR ) 100 MG tablet Take 100 mg by mouth daily.      venlafaxine  (EFFEXOR ) 75 MG tablet Take 75 mg by mouth daily.       Musculoskeletal: Strength & Muscle Tone: within normal limits Gait & Station: normal Patient leans: N/A            Psychiatric Specialty Exam:  Presentation  General Appearance:  Fairly Groomed  Eye Contact: Minimal  Speech: Other (comment); Clear and Coherent (rapid)  Speech Volume: Normal  Handedness: Right   Mood and Affect  Mood: Anxious; Irritable (Frustrated)  Affect: Congruent; Restricted   Thought Process  Thought Processes: Goal Directed  Duration of Psychotic Symptoms: Depressive Symptoms Ongoing Past Diagnosis of Schizophrenia or Psychoactive disorder: No  Descriptions of Associations:Intact  Orientation:Partial  Thought Content:Logical (limited memory of overdose  intent)  Hallucinations:Hallucinations: None  Ideas of Reference:None  Suicidal Thoughts:Suicidal Thoughts: No  Homicidal Thoughts:Homicidal Thoughts: No   Sensorium  Memory: Immediate Poor; Remote Poor  Judgment: Poor  Insight: Poor   Executive Functions  Concentration: Good  Attention Span: Poor  Recall: Poor  Fund of Knowledge: Good  Language: Good   Psychomotor Activity  Psychomotor Activity: Psychomotor Activity: Normal   Assets  Assets: Communication Skills; Housing; Financial Resources/Insurance   Sleep  Sleep: Sleep: Poor Number of Hours of Sleep: 4    Physical Exam: Physical Exam Vitals and nursing note reviewed.  Constitutional:      Appearance: Normal appearance.  HENT:     Head: Normocephalic and atraumatic.     Nose: Nose normal.  Pulmonary:     Effort: Pulmonary effort is normal.  Musculoskeletal:        General: Normal range of motion.     Cervical back: Normal range of motion.  Neurological:     General: No focal deficit present.     Mental Status: She is alert and oriented to person, place, and time. Mental status is at baseline.  Psychiatric:        Attention and Perception: Attention and perception normal.        Mood and Affect: Affect is labile and blunt.        Speech: Speech is rapid and pressured.        Behavior: Behavior is hyperactive. Behavior is cooperative.        Thought Content: Thought content normal.        Cognition and Memory: Cognition is impaired. She exhibits impaired recent memory.        Judgment: Judgment is impulsive.    Review of Systems  Musculoskeletal:  Positive for back pain.  Psychiatric/Behavioral:  Positive for depression and substance abuse. The patient is nervous/anxious and has insomnia.   All other systems reviewed and are negative.  Blood pressure 123/83, pulse 76, temperature 98 F (36.7 C), temperature source Oral, resp. rate 20, height 5' 4 (1.626 m), weight 93.4 kg,  SpO2 97%. Body mass index is 35.36 kg/m.  Treatment Plan Summary: Daily contact with patient to assess and evaluate symptoms and progress in treatment and Medication management Effexor  225 mg daily to target depressive symptoms, pending  psychiatric evaluation. Trazodone  50 mg nightly as needed for sleep, addressing insomnia Amitriptyline  10 mg for  Neuropathic pain Klonopin  taper Prevent withdrawal symptoms (e.g., seizures, anxiety, agitation CIWA Protocol Melatonin 3 mg nightly for sleep disturbances or insomnia Repeat EKG due to overdose Educate the patient on the risks of THC use and the impact on sleep and mental health. Refer to substance use counseling for cannabis and benzodiazepine education and alternative coping mechanisms.   Observation Level/Precautions:  Detox 15 minute checks Seizure  Laboratory:   EKG  Psychotherapy:    Medications:    Consultations:    Discharge Concerns:    Estimated LOS:  Other:     Physician Treatment Plan for Primary Diagnosis: Overdose of venlafaxine  Long Term Goal(s): Improvement in symptoms so as ready for discharge  Short Term Goals: Ability to identify changes in lifestyle to reduce recurrence of condition will improve, Ability to verbalize feelings will improve, Ability to disclose and discuss suicidal ideas, Ability to demonstrate self-control will improve, Ability to identify and develop effective coping behaviors will improve, Ability to maintain clinical measurements within normal limits will improve, Compliance with prescribed medications will improve, and Ability to identify triggers associated with substance abuse/mental health issues will improve  Physician Treatment Plan for Secondary Diagnosis: Principal Problem:   Overdose of venlafaxine  Active Problems:   Borderline personality disorder (HCC)   Cannabis abuse   History of drug use   Nicotine  dependence with current use   Depression   Back pain  Long Term Goal(s):  Improvement in symptoms so as ready for discharge  Short Term Goals: Ability to identify changes in lifestyle to reduce recurrence of condition will improve, Ability to verbalize feelings will improve, Ability to disclose and discuss suicidal ideas, Ability to demonstrate self-control will improve, Ability to identify and develop effective coping behaviors will improve, Ability to maintain clinical measurements within normal limits will improve, Compliance with prescribed medications will improve, and Ability to identify triggers associated with substance abuse/mental health issues will improve  I certify that inpatient services furnished can reasonably be expected to improve the patient's condition.    Brad GORMAN Moats, NP 1/6/20252:20 PM

## 2023-12-13 NOTE — Progress Notes (Signed)
 Patient had no complaints nor voiced any symptoms of withdrawals, stating I don't take anything that will make me withdraw. The Ativan  is just mental, a head thing.    12/13/23 1342  CIWA-Ar  Nausea and Vomiting 0  Tactile Disturbances 0  Tremor 0  Auditory Disturbances 0  Paroxysmal Sweats 0  Visual Disturbances 0  Anxiety 0  Headache, Fullness in Head 0  Agitation 0  Orientation and Clouding of Sensorium 0  CIWA-Ar Total 0

## 2023-12-13 NOTE — Plan of Care (Signed)
 Patient compliant with meds ordered for bedtime. Participated in group. Asked appropriate questions related to admission to ensure rules are followed. Care, comfort and safety maintained/ongoing.  Problem: Education: Goal: Knowledge of [facility] [facility] Education information/materials will improve Outcome: Progressing Goal: Emotional status will improve Outcome: Progressing Goal: Mental status will improve Outcome: Progressing Goal: Verbalization of understanding the information provided will improve Outcome: Progressing   Problem: Activity: Goal: Interest or engagement in activities will improve Outcome: Progressing Goal: Sleeping patterns will improve Outcome: Progressing   Problem: Coping: Goal: Ability to verbalize frustrations and anger appropriately will improve Outcome: Progressing Goal: Ability to demonstrate self-control will improve Outcome: Progressing   Problem: Health Behavior/Discharge Planning: Goal: Identification of resources available to assist in meeting health care needs will improve Outcome: Progressing Goal: Compliance with treatment plan for underlying cause of condition will improve Outcome: Progressing   Problem: Physical Regulation: Goal: Ability to maintain clinical measurements within normal limits will improve Outcome: Progressing   Problem: Safety: Goal: Periods of time without injury will increase Outcome: Progressing   Problem: Coping: Goal: Coping ability will improve Outcome: Progressing   Problem: Health Behavior/Discharge Planning: Goal: Identification of resources available to assist in meeting health care needs will improve Outcome: Progressing   Problem: Self-Concept: Goal: Ability to disclose and discuss suicidal ideas will improve Outcome: Progressing Goal: Will verbalize positive feelings about self Outcome: Progressing Note: Patient is on track. Patient will maintain adherence and identify triggers to avoid bizarre  behaviors reported.

## 2023-12-13 NOTE — [facility] Suicide Risk Assessment (Addendum)
 [facility] Admission Suicide Risk Assessment   Nursing information obtained from:    Demographic factors:    Current Mental Status:    Loss Factors:    Historical Factors:    Risk Reduction Factors:     Total Time spent with patient: 2.5 hours Principal Problem: Overdose of venlafaxine  Diagnosis:  Principal Problem:   Overdose of venlafaxine  Active Problems:   Borderline personality disorder (HCC)   Cannabis abuse   History of drug use   Nicotine  dependence with current use   Depression   Back pain  Subjective Data: 18 Caucasian female was brought in under IVC for a suspected overdose on Effexor  and Ativan . EMS and police reported that the patient was screaming and yelling, "please stop," and that her mother suspected she had taken an entire bottle of her medication. EMS provided two full bottles of Venlafaxine  believed to be recently filled. The patient is a poor historian and unable to recall events leading to her admission, stating, "I don't remember what happened last night." The patient denies any trauma, falls, or recent life changes. Hisotery of Borderline Personality Disorder, Major Depressive Disorder, ADHD, Cannabis Abuse, and Cocaine Abuse. She has a history of suicidal behavior and overdoses. Currenlty under [facility] outpatient mental health care and therapy involvement.Patient tested positive for benzodiazepines and cannabinoids. She denies substance use except for a THC gummy for sleep.Recent Suicide Attempt: Patient presents with suicidal behavior, consistent with undertreated depression. Collateral Mother suspects the patient overdosed on her medication. EMS provided two full bottles of Venlafaxine  thought to be recently filled.Reports of memory difficulties, unable to recall the year (stated 2005), but accurately recalls address, date of birth, and number of children.Reports chronic back pain impacting sleep.     Continued Clinical Symptoms:  Alcohol Use Disorder Identification Test  Final Score (AUDIT): 0 The Alcohol Use Disorders Identification Test, Guidelines for Use in Primary Care, Second Edition.  World Science Writer [facility]). Score between 0-7:  no or low risk or alcohol related problems. Score between 8-15:  moderate risk of alcohol related problems. Score between 16-19:  high risk of alcohol related problems. Score 20 or above:  warrants further diagnostic evaluation for alcohol dependence and treatment.   CLINICAL FACTORS:   Bipolar Disorder:   Depressive phase Depression:   Comorbid alcohol abuse/dependence Impulsivity Insomnia Alcohol/Substance Abuse/Dependencies Personality Disorders:   Cluster B Comorbid alcohol abuse/dependence Medical Diagnoses and Treatments/Surgeries   Musculoskeletal: Strength & Muscle Tone: within normal limits Gait & Station: normal Patient leans: N/A  Psychiatric Specialty Exam:  Presentation  General Appearance: Fairly Groomed  Eye Contact:Minimal  Speech:Other (comment); Clear and Coherent (rapid)  Speech Volume:Normal  Handedness:Right   Mood and Affect  Mood:Anxious; Irritable (Frustrated)  Affect:Congruent; Restricted   Thought Process  Thought Processes:Goal Directed  Descriptions of Associations:Intact  Orientation:Partial  Thought Content:Logical (limited memory of overdose intent)  History of Schizophrenia/Schizoaffective disorder:No  Duration of Psychotic Symptoms: none recorded Hallucinations:Hallucinations: None  Ideas of Reference:None  Suicidal Thoughts:Suicidal Thoughts: No  Homicidal Thoughts:Homicidal Thoughts: No   Sensorium  Memory:Immediate Poor; Remote Poor  Judgment:Poor  Insight:Poor   Executive Functions  Concentration:Good  Attention Span:Poor  Recall:Poor  Fund of Knowledge:Good  Language:Good   Psychomotor Activity  Psychomotor Activity:Psychomotor Activity: Normal   Assets  Assets:Communication Skills; Housing; Nature Conservation Officer   Sleep  Sleep:Sleep: Poor Number of Hours of Sleep: 4    Physical Exam: Physical Exam Vitals and nursing note reviewed.  Constitutional:      Appearance: Normal appearance.  HENT:     Head: Normocephalic and atraumatic.     Nose: Nose normal.  Pulmonary:     Effort: Pulmonary effort is normal.  Musculoskeletal:        General: Normal range of motion.     Cervical back: Normal range of motion.  Neurological:     General: No focal deficit present.     Mental Status: She is alert. Mental status is at baseline.  Psychiatric:        Attention and Perception: Attention normal.        Mood and Affect: Mood is anxious and depressed. Affect is flat.        Speech: Speech is rapid and pressured.        Behavior: Behavior is hyperactive. Behavior is cooperative.        Thought Content: Thought content normal.        Cognition and Memory: Cognition is impaired. She exhibits impaired recent memory and impaired remote memory.        Judgment: Judgment is impulsive.    Review of Systems  Musculoskeletal:  Positive for back pain.  Psychiatric/Behavioral:  Positive for depression and substance abuse. The patient is nervous/anxious and has insomnia.   All other systems reviewed and are negative.  Blood pressure 123/83, pulse 76, temperature 98 F (36.7 C), temperature source Oral, resp. rate 20, height 5' 4 (1.626 m), weight 93.4 kg, SpO2 97%. Body mass index is 35.36 kg/m.   COGNITIVE FEATURES THAT CONTRIBUTE TO RISK:  None    SUICIDE RISK:   Minimal: No identifiable suicidal ideation.  Patients presenting with no risk factors but with morbid ruminations; may be classified as minimal risk based on the severity of the depressive symptoms  PLAN OF CARE:  Effexor  225 mg daily to target depressive symptoms, pending psychiatric evaluation. Trazodone  50 mg nightly as needed for sleep, addressing insomnia Amitriptyline  10 mg for  Neuropathic pain Klonopin  taper  Prevent withdrawal symptoms (e.g., seizures, anxiety, agitation CIWA Protocol Melatonin 3 mg nightly for sleep disturbances or insomnia Repeat EKG due to overdose Educate the patient on the risks of THC use and the impact on sleep and mental health. Refer to substance use counseling for cannabis and benzodiazepine education and alternative coping mechanisms.  I certify that inpatient services furnished can reasonably be expected to improve the patient's condition.   Brad GORMAN Moats, NP 12/13/2023, 2:20 PM

## 2023-12-13 NOTE — Tx Team (Signed)
 Initial Treatment Plan 12/13/2023 1:53 PM [name] FMW:[id number]    PATIENT STRESSORS: Medication change or noncompliance   Substance abuse     PATIENT STRENGTHS: Ability for insight  Communication skills  General fund of knowledge  Supportive family/friends    PATIENT IDENTIFIED PROBLEMS: Suspected OD prior to admission (patient doesn't remember)  Substance use  Confusion (not knowing what happened to bring her here)                 DISCHARGE CRITERIA:  Ability to meet basic life and health needs Improved stabilization in mood, thinking, and/or behavior Need for constant or close observation no longer present Reduction of life-threatening or endangering symptoms to within safe limits  PRELIMINARY DISCHARGE PLAN: Outpatient therapy Return to previous living arrangement Return to previous work or school arrangements  PATIENT/FAMILY INVOLVEMENT: This treatment plan has been presented to and reviewed with the patient, [name]. The patient has been given the opportunity to ask questions and make suggestions.  Jarious Lyon, RN 12/13/2023, 1:53 PM

## 2023-12-13 NOTE — ED Notes (Signed)
 Ivc/consult done/NP recommend inpatient psychiatric hospitalization when medically cleared.

## 2023-12-13 NOTE — BH IP Treatment Plan (Signed)
 Interdisciplinary Treatment and Diagnostic Plan Update  12/13/2023 Time of Session: 9:03AM [name] MRN: [id number]  Principal Diagnosis: Depression  Secondary Diagnoses: Principal Problem:   Depression   Current Medications:  Current Facility-Administered Medications  Medication Dose Route Frequency Provider Last Rate Last Admin   acetaminophen  (TYLENOL ) tablet 650 mg  650 mg Oral Q6H PRN Melvenia Madelene HERO, NP       alum & mag hydroxide-simeth (MAALOX/MYLANTA) 200-200-20 MG/5ML suspension 30 mL  30 mL Oral Q4H PRN Dixon, Rashaun M, NP       haloperidol  (HALDOL ) tablet 5 mg  5 mg Oral TID PRN Melvenia Madelene HERO, NP       And   diphenhydrAMINE  (BENADRYL ) capsule 50 mg  50 mg Oral TID PRN Melvenia Madelene HERO, NP       haloperidol  lactate (HALDOL ) injection 5 mg  5 mg Intramuscular TID PRN Melvenia Madelene HERO, NP       And   diphenhydrAMINE  (BENADRYL ) injection 50 mg  50 mg Intramuscular TID PRN Melvenia Madelene HERO, NP       And   LORazepam  (ATIVAN ) injection 2 mg  2 mg Intramuscular TID PRN Melvenia Madelene HERO, NP       haloperidol  lactate (HALDOL ) injection 10 mg  10 mg Intramuscular TID PRN Melvenia Madelene HERO, NP       And   diphenhydrAMINE  (BENADRYL ) injection 50 mg  50 mg Intramuscular TID PRN Melvenia Madelene HERO, NP       And   LORazepam  (ATIVAN ) injection 2 mg  2 mg Intramuscular TID PRN Dixon, Rashaun M, NP       magnesium  hydroxide (MILK OF MAGNESIA) suspension 30 mL  30 mL Oral Daily PRN Dixon, Rashaun M, NP       traZODone  (DESYREL ) tablet 50 mg  50 mg Oral QHS PRN Dixon, Madelene HERO, NP       PTA Medications: Medications Prior to Admission  Medication Sig Dispense Refill Last Dose/Taking   albuterol  (VENTOLIN  HFA) 108 (90 Base) MCG/ACT inhaler INHALE 2 PUFFS INTO THE LUNGS EVERY 6 HOURS AS NEEDED FOR WHEEZING OR SHORTNESS OF BREATH 8 g 0    baclofen  (LIORESAL ) 20 MG tablet Take 1 tablet (20 mg total) by mouth 3 (three) times daily. 90 each 2    lamoTRIgine  (LAMICTAL ) 150 MG tablet  Take 150 mg by mouth daily.      LORazepam  (ATIVAN ) 1 MG tablet Take 1 mg by mouth 2 (two) times daily.      venlafaxine  (EFFEXOR ) 100 MG tablet Take 100 mg by mouth daily.      venlafaxine  (EFFEXOR ) 75 MG tablet Take 75 mg by mouth daily.       Patient Stressors:    Patient Strengths:    Treatment Modalities: Medication Management, Group therapy, Case management,  1 to 1 session with clinician, Psychoeducation, Recreational therapy.   Physician Treatment Plan for Primary Diagnosis: Depression Long Term Goal(s):     Short Term Goals:    Medication Management: Evaluate patient's response, side effects, and tolerance of medication regimen.  Therapeutic Interventions: 1 to 1 sessions, Unit Group sessions and Medication administration.  Evaluation of Outcomes: Not Met  Physician Treatment Plan for Secondary Diagnosis: Principal Problem:   Depression  Long Term Goal(s):     Short Term Goals:       Medication Management: Evaluate patient's response, side effects, and tolerance of medication regimen.  Therapeutic Interventions: 1 to 1 sessions, Unit Group sessions and Medication administration.  Evaluation of Outcomes: Not Met   RN Treatment Plan for Primary Diagnosis: Depression Long Term Goal(s): Knowledge of disease and therapeutic regimen to maintain health will improve  Short Term Goals: Ability to remain free from injury will improve, Ability to verbalize frustration and anger appropriately will improve, Ability to demonstrate self-control, Ability to participate in decision making will improve, Ability to verbalize feelings will improve, Ability to disclose and discuss suicidal ideas, and Ability to identify and develop effective coping behaviors will improve  Medication Management: RN will administer medications as ordered by provider, will assess and evaluate patient's response and provide education to patient for prescribed medication. RN will report any adverse and/or  side effects to prescribing provider.  Therapeutic Interventions: 1 on 1 counseling sessions, Psychoeducation, Medication administration, Evaluate responses to treatment, Monitor vital signs and CBGs as ordered, Perform/monitor CIWA, COWS, AIMS and Fall Risk screenings as ordered, Perform wound care treatments as ordered.  Evaluation of Outcomes: Not Met   LCSW Treatment Plan for Primary Diagnosis: Depression Long Term Goal(s): Safe transition to appropriate next level of care at discharge, Engage patient in therapeutic group addressing interpersonal concerns.  Short Term Goals: Engage patient in aftercare planning with referrals and resources, Increase social support, Increase ability to appropriately verbalize feelings, Increase emotional regulation, Facilitate acceptance of mental health diagnosis and concerns, Facilitate patient progression through stages of change regarding substance use diagnoses and concerns, Identify triggers associated with mental health/substance abuse issues, and Increase skills for wellness and recovery  Therapeutic Interventions: Assess for all discharge needs, 1 to 1 time with Social worker, Explore available resources and support systems, Assess for adequacy in community support network, Educate family and significant other(s) on suicide prevention, Complete Psychosocial Assessment, Interpersonal group therapy.  Evaluation of Outcomes: Not Met   Progress in Treatment: Attending groups: Yes. and No. Participating in groups: Yes. and No. Taking medication as prescribed: Yes. and No. Toleration medication: Yes. and No. Family/Significant other contact made: No, will contact:  CSW to contact once permission is granted.  Patient understands diagnosis: Yes. and No. Discussing patient identified problems/goals with staff: Yes. Medical problems stabilized or resolved: Yes. and No. Denies suicidal/homicidal ideation: Yes. and No. Issues/concerns per patient  self-inventory: No. Other: None  New problem(s) identified: No, Describe:  None  New Short Term/Long Term Goal(s):detox, elimination of symptoms of psychosis, medication management for mood stabilization; elimination of SI thoughts; development of comprehensive mental wellness/sobriety plan.    Patient Goals:  Plan to go to groups. Do all the stuff I'm supposed to do.  Discharge Plan or Barriers: CSW to assist in the development of appropriate discharge planning.   Reason for Continuation of Hospitalization: Anxiety Delusions  Depression Medication stabilization Suicidal ideation  Estimated Length of Stay:1-7 days.   Last 3 [place] Suicide Severity Risk Score: Flowsheet Row ED from 11/01/2023 in [facility] Emergency Department at [facility] ED from 03/29/2022 in [facility] Emergency Department at [facility] ED from 12/30/2021 in [facility] Emergency Department at [facility]  C-SSRS RISK CATEGORY No Risk No Risk No Risk       Last [facility] 2/9 Scores:    11/15/2023    9:30 AM 10/07/2023   10:54 AM 05/04/2023   10:07 AM  Depression screen PHQ 2/9  Decreased Interest 0 0 0  Down, Depressed, Hopeless 0 0 0  PHQ - 2 Score 0 0 0  Altered sleeping 0 0 3  Tired, decreased energy 0 0 0  Change in appetite  0 0 0  Feeling bad or failure about yourself  0 0 0  Trouble concentrating 0 0 3  Moving slowly or fidgety/restless 0 0 0  Suicidal thoughts 0 0 0  PHQ-9 Score 0 0 6  Difficult doing work/chores  Not difficult at all Somewhat difficult    Scribe for Treatment Team: Alveta CHRISTELLA Kerns, LCSW 12/13/2023 9:45 AM

## 2023-12-13 NOTE — Group Note (Signed)
 Recreation Therapy Group Note   Group Topic:Coping Skills  Group Date: 12/13/2023 Start Time: 1000 End Time: 1045 Facilitators: Celestia Jeoffrey BRAVO, LRT, CTRS Location:  Craft Room  Group Description: Mind Map.  Patient was provided a blank template of a diagram with 32 blank boxes in a tiered system, branching from the center (similar to a bubble chart). LRT directed patients to label the middle of the diagram Coping Skills. LRT and patients then came up with 8 different coping skills as examples. Pt were directed to record their coping skills in the 2nd tier boxes closest to the center.  Patients would then share their coping skills with the group as LRT wrote them out. LRT gave a handout of 99 different coping skills at the end of group.   Goal Area(s) Addressed: Patients will be able to define "coping skills". Patient will identify new coping skills.  Patient will increase communication.   Affect/Mood: Appropriate   Participation Level: Active and Engaged   Participation Quality: Independent   Behavior: Appropriate, Calm, and Cooperative   Speech/Thought Process: Coherent   Insight: Fair   Judgement: Good   Modes of Intervention: Clarification, Education, Guided Discussion, and Worksheet   Patient Response to Interventions:  Attentive and Receptive   Education Outcome:  Acknowledges education   Clinical Observations/Individualized Feedback: Lakenya was active in their participation of session activities and group discussion. Pt identified walking away and my great dane as coping skills. Pt spontaneously contributed to group discussion while interacting well with LRT and peers duration of session.    Plan: Continue to engage patient in RT group sessions 2-3x/week.   Jeoffrey BRAVO Celestia, LRT, CTRS 12/13/2023 11:04 AM

## 2023-12-13 NOTE — ED Notes (Signed)
 Pt assisted to restroom. Pt provided urine specimen. Pt given apple juice.
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
## 2023-12-13 NOTE — [facility] Suicide Risk Assessment (Signed)
 [facility] INPATIENT:  Family/Significant Other Suicide Prevention Education  Suicide Prevention Education:  Patient Refusal for Family/Significant Other Suicide Prevention Education: The patient [name] has refused to provide written consent for family/significant other to be provided Family/Significant Other Suicide Prevention Education during admission and/or prior to discharge.  Physician notified.  SPE completed with pt, as pt refused to consent to family contact. SPI pamphlet provided to pt and pt was encouraged to share information with support network, ask questions, and talk about any concerns relating to SPE. Pt denies access to guns/firearms and verbalized understanding of information provided. Mobile Crisis information also provided to pt.   Sherryle JINNY Margo 12/13/2023, 1:05 PM

## 2023-12-13 NOTE — Group Note (Signed)
 Date:  12/13/2023 Time:  8:38 PM  Group Topic/Focus:  Wellness Toolbox:   The focus of this group is to discuss various aspects of wellness, balancing those aspects and exploring ways to increase the ability to experience wellness.  Patients will create a wellness toolbox for use upon discharge. Wrap-Up Group:   The focus of this group is to help patients review their daily goal of treatment and discuss progress on daily workbooks.    Participation Level:  Active  Participation Quality:  Appropriate and Attentive  Affect:  Appropriate  Cognitive:  Alert and Appropriate  Insight: Appropriate  Engagement in Group:  Engaged  Modes of Intervention:  Discussion  Additional Comments:     Maglione,Viktorya Arguijo E 12/13/2023, 8:38 PM

## 2023-12-14 DIAGNOSIS — B9689 Other specified bacterial agents as the cause of diseases classified elsewhere: Secondary | ICD-10-CM | POA: Diagnosis not present

## 2023-12-14 DIAGNOSIS — T424X1A Poisoning by benzodiazepines, accidental (unintentional), initial encounter: Principal | ICD-10-CM

## 2023-12-14 DIAGNOSIS — N3000 Acute cystitis without hematuria: Secondary | ICD-10-CM | POA: Diagnosis not present

## 2023-12-14 DIAGNOSIS — N760 Acute vaginitis: Secondary | ICD-10-CM

## 2023-12-14 LAB — URINALYSIS, COMPLETE (UACMP) WITH MICROSCOPIC
Bilirubin Urine: NEGATIVE
Glucose, UA: NEGATIVE mg/dL
Ketones, ur: NEGATIVE mg/dL
Nitrite: NEGATIVE
Protein, ur: NEGATIVE mg/dL
Specific Gravity, Urine: 1.011 (ref 1.005–1.030)
WBC, UA: >50 WBC/hpf (ref 0–5)
pH: 6 (ref 5.0–8.0)

## 2023-12-14 MED ORDER — LAMOTRIGINE 100 MG PO TABS
100.0000 mg | ORAL_TABLET | Freq: Every day | ORAL | Status: DC
  Administered 2023-12-14 – 2023-12-16 (×3): 100 mg via ORAL
  Filled 2023-12-14 (×3): qty 1

## 2023-12-14 MED ORDER — CLONAZEPAM 0.25 MG PO TBDP
1.0000 mg | ORAL_TABLET | Freq: Once | ORAL | Status: AC
  Administered 2023-12-14: 1 mg via ORAL
  Filled 2023-12-14: qty 4

## 2023-12-14 MED ORDER — NICOTINE POLACRILEX 2 MG MT GUM
2.0000 mg | CHEWING_GUM | OROMUCOSAL | Status: DC | PRN
  Administered 2023-12-14 – 2023-12-15 (×3): 2 mg via ORAL
  Filled 2023-12-14 (×3): qty 1

## 2023-12-14 MED ORDER — BUPROPION HCL 75 MG PO TABS
75.0000 mg | ORAL_TABLET | Freq: Every day | ORAL | Status: DC
  Administered 2023-12-14 – 2023-12-16 (×3): 75 mg via ORAL
  Filled 2023-12-14 (×3): qty 1

## 2023-12-14 MED ORDER — TRAZODONE HCL 100 MG PO TABS
100.0000 mg | ORAL_TABLET | Freq: Every day | ORAL | Status: DC
  Administered 2023-12-14 – 2023-12-15 (×2): 100 mg via ORAL
  Filled 2023-12-14 (×2): qty 1

## 2023-12-14 MED ORDER — VENLAFAXINE HCL ER 75 MG PO CP24: 75.0000 mg | ORAL_CAPSULE | Freq: Every day | ORAL | Status: DC

## 2023-12-14 MED ORDER — CLONAZEPAM 0.5 MG PO TABS
0.5000 mg | ORAL_TABLET | Freq: Two times a day (BID) | ORAL | Status: AC
  Administered 2023-12-14 – 2023-12-15 (×2): 0.5 mg via ORAL
  Filled 2023-12-14 (×2): qty 1

## 2023-12-14 NOTE — Group Note (Addendum)
 LCSW Group Therapy Note   Group Date: 12/14/2023 Start Time: 1330 End Time: 1430   Type of Therapy and Topic:  Group Therapy: Boundaries  Participation Level:  None  Description of Group: This group will address the use of boundaries in their personal lives. Patients will explore why boundaries are important, the difference between healthy and unhealthy boundaries, and negative and postive outcomes of different boundaries and will look at how boundaries can be crossed.  Patients will be encouraged to identify current boundaries in their own lives and identify what kind of boundary is being set. Facilitators will guide patients in utilizing problem-solving interventions to address and correct types boundaries being used and to address when no boundary is being used. Understanding and applying boundaries will be explored and addressed for obtaining and maintaining a balanced life. Patients will be encouraged to explore ways to assertively make their boundaries and needs known to significant others in their lives, using other group members and facilitator for role play, support, and feedback.  Therapeutic Goals:  1.  Patient will identify areas in their life where setting clear boundaries could be  used to improve their life.  2.  Patient will identify signs/triggers that a boundary is not being respected. 3.  Patient will identify two ways to set boundaries in order to achieve balance in  their lives: 4.  Patient will demonstrate ability to communicate their needs and set boundaries  through discussion and/or role plays  Summary of Patient Progress:   Patient was present in group.  However, patient was not active in group discussions.  Despite not being active in group discussions, patient appeared attentive and supportive of other group members.    Therapeutic Modalities:   Cognitive Behavioral Therapy Solution-Focused Therapy  Sherryle JINNY Margo, LCSW 12/14/2023  3:01 PM

## 2023-12-14 NOTE — Consult Note (Signed)
 Initial Consultation Note   Patient: [name] FMW:[id number] DOB: 08-14-1986 PCP: Melvin Pao, NP DOA: 12/13/2023 DOS: the patient was seen and examined on 12/14/2023 Primary service: Cam Charlie [place] DEWAINE  Referring physician: Brad Jury, NP  Reason for consult: Possible UTI/bacterial vaginosis   Assessment and Plan:  Urinary tract infection Urinalysis suggestive of UTI We will treat with ciprofloxacin  for 5-day course  Bacterial vaginosis Patient having symptoms concerning for bacterial vaginosis We will treat with metronidazole  500 mg twice daily for 7 days Plan of care discussed with psychiatry team  Psychiatry conditions Continue management by psych   [facility] will sign off at present, please call us  again when needed.  HPI: [name] is a 38 y.o. female  with past medical history of bipolar, chronic low back pain, polysubstance abuse, migraine headache, morbid obesity and osteoarthritis of the knee who is currently being managed in the behavioral unit.  [facility] been consulted with concerns of urinary tract infection as evidenced on UA.  Patient denied nausea vomiting worsening abdominal pain.  Admits to foul-smelling vaginal discharge.  According to patient anytime she has this type of discharge it gets cleared by Flagyl .  Review of Systems: As mentioned in the history of present illness. All other systems reviewed and are negative. Past Medical History:  Diagnosis Date   Borderline personality disorder (HCC) 04/16/2013   Cannabis abuse 2014   Chondromalacia patellae, right knee    Depression    Well-controlled with Zoloft    Gestational diabetes    Has prediabetes now.   Headache    Heroin abuse (HCC)    History of drug use 12/15/2017   IDA (iron deficiency anemia)    Migraines    Morbid obesity with BMI of 40.0-44.9, adult (HCC)    Nicotine  dependence with current use 06/26/2021   Osteoarthritis, knee    Past Surgical History:  Procedure  Laterality Date   ANKLE SURGERY Left 12/07/2010   CESAREAN SECTION  12/07/2009   CESAREAN SECTION WITH BILATERAL TUBAL LIGATION Bilateral 06/15/2018   Procedure: CESAREAN SECTION WITH BILATERAL TUBAL LIGATION;  Surgeon: Lovetta Debby PARAS, MD;  Location: [facility] ORS;  Service: Obstetrics;  Laterality: Bilateral;   CHOLECYSTECTOMY N/A 09/27/2016   Procedure: LAPAROSCOPIC CHOLECYSTECTOMY WITH INTRAOPERATIVE CHOLANGIOGRAM;  Surgeon: Charlie FORBES Fell, MD;  Location: [facility] ORS;  Service: General;  Laterality: N/A;   DILATION AND CURETTAGE OF UTERUS  12/08/2011   KNEE SURGERY Right 08/05/2022   TONSILLECTOMY  12/07/2005   Social History:  reports that she has quit smoking. Her smoking use included cigarettes. She has a 13 pack-year smoking history. She has never used smokeless tobacco. She reports current alcohol use. She reports that she does not currently use drugs.  Allergies  Allergen Reactions   Clindamycin/Lincomycin Anaphylaxis and Rash   Gabapentin Swelling    Leg swelling   Amoxicillin Rash    Has patient had a PCN reaction causing immediate rash, facial/tongue/throat swelling, SOB or lightheadedness with hypotension: Yes Has patient had a PCN reaction causing severe rash involving mucus membranes or skin necrosis: No Has patient had a PCN reaction that required hospitalization: No Has patient had a PCN reaction occurring within the last 10 years: No If all of the above answers are NO, then may proceed with Cephalosporin use.     Family History  Problem Relation Age of Onset   Arthritis Mother    Cancer Mother        skin   Hyperlipidemia Mother  takes meds   Coronary artery disease Mother        non-obstructive   Cancer Father        Lung   Anxiety disorder Sister    Depression Sister    ADD / ADHD Son    Heart attack Maternal Grandfather 32   Coronary artery disease Maternal Grandfather 34       s/p CABG    Prior to Admission medications   Medication Sig  Start Date End Date Taking? Authorizing Provider  albuterol  (VENTOLIN  HFA) 108 (90 Base) MCG/ACT inhaler INHALE 2 PUFFS INTO THE LUNGS EVERY 6 HOURS AS NEEDED FOR WHEEZING OR SHORTNESS OF BREATH 11/15/23   Melvin Pao, NP  baclofen  (LIORESAL ) 20 MG tablet Take 1 tablet (20 mg total) by mouth 3 (three) times daily. 11/15/23   Melvin Pao, NP  lamoTRIgine  (LAMICTAL ) 150 MG tablet Take 150 mg by mouth daily.    [provider]  LORazepam  (ATIVAN ) 1 MG tablet Take 1 mg by mouth 2 (two) times daily. 10/02/23   [provider]  venlafaxine  (EFFEXOR ) 100 MG tablet Take 100 mg by mouth daily. 08/04/23   [provider]  venlafaxine  (EFFEXOR ) 75 MG tablet Take 75 mg by mouth daily. 08/04/23   [provider]    Physical Exam: Vitals:   12/13/23 0700 12/13/23 1742 12/14/23 0602  BP: 123/83 117/84 (!) 95/57  Pulse: 76 82 74  Resp: 20 16 16   Temp: 98 F (36.7 C) 98.6 F (37 C) 97.8 F (36.6 C)  TempSrc: Oral Oral Oral  SpO2: 97% 97% 97%  Weight: 93.4 kg    Height: 5' 4 (1.626 m)     General: Young female not in acute distress Respiratory: Clear to auscultation bilaterally Abdomen: Nontender no organ palpable CNS: Alert and oriented x 3 moving all extremities CVS: S1-S2 present no murmur heard  Data Reviewed:  I have reviewed patient's previous records on file Urinalysis reviewed showing UTI    Latest Ref Rng & Units 12/12/2023    9:11 AM 03/29/2022    5:48 PM 09/03/2021    2:34 PM  BMP  Glucose 70 - 99 mg/dL 849  875  73   BUN 6 - 20 mg/dL 10  7  12    Creatinine 0.44 - 1.00 mg/dL 9.27  9.36  9.31   BUN/Creat Ratio 9 - 23   18   Sodium 135 - 145 mmol/L 138  141  139   Potassium 3.5 - 5.1 mmol/L 3.2  3.9  4.2   Chloride 98 - 111 mmol/L 105  110  103   CO2 22 - 32 mmol/L 23  24  23    Calcium 8.9 - 10.3 mg/dL 8.7  9.2  8.8        Latest Ref Rng & Units 12/12/2023    9:11 AM 03/29/2022    5:48 PM 02/02/2022   11:25 AM  CBC  WBC 4.0 - 10.5  K/uL 7.2  8.4  6.6   Hemoglobin 12.0 - 15.0 g/dL 85.6  84.7  89.2   Hematocrit 36.0 - 46.0 % 43.1  48.4  37.0   Platelets 150 - 400 K/uL 170  254  204       Family Communication: None present at bedside  Primary team communication: Communicated with Brad nurse practitioner  Thank you very much for involving us  in the care of your patient.  Author: Drue ONEIDA Potter, MD 12/14/2023 4:56 PM  For on call review [email or web address].

## 2023-12-14 NOTE — Plan of Care (Signed)
  Problem: Education: Goal: Emotional status will improve Outcome: Progressing Goal: Mental status will improve Outcome: Progressing Goal: Verbalization of understanding the information provided will improve Outcome: Progressing   Problem: Activity: Goal: Interest or engagement in activities will improve Outcome: Progressing   Problem: Coping: Goal: Ability to verbalize frustrations and anger appropriately will improve Outcome: Progressing Goal: Ability to demonstrate self-control will improve Outcome: Progressing   

## 2023-12-14 NOTE — Group Note (Signed)
 Date:  12/14/2023 Time:  9:37 PM  Group Topic/Focus:  Stages of Change:   The focus of this group is to explain the stages of change and help patients identify changes they want to make upon discharge. Wrap-Up Group:   The focus of this group is to help patients review their daily goal of treatment and discuss progress on daily workbooks.    Participation Level:  Active  Participation Quality:  Appropriate and Attentive  Affect:  Appropriate and Excited  Cognitive:  Alert and Appropriate  Insight: Appropriate  Engagement in Group:  Engaged and Supportive  Modes of Intervention:  Discussion and Support  Additional Comments:     Maglione,Thi Sisemore E 12/14/2023, 9:37 PM

## 2023-12-14 NOTE — Progress Notes (Signed)
 This Clinical research associate just spoke to [place], Charity fundraiser at [employer] and updated her on patient's QRS Interval. It is now 69, which is an improvement from 121. Verlon Au, RN stated that she will update the chart on her end as well. EKG was shown to NP.

## 2023-12-14 NOTE — Progress Notes (Signed)
 Patient asleep when knocked and entered room for safety check and vital sign measurements. Patient stated I did not sleep well; I tossed and turned all night. I need to be started back on my regular meds.  12/14/23 0602  Vital Signs  Temp 97.8 F (36.6 C)  Temp Source Oral  Pulse Rate 74  Pulse Rate Source Monitor  Resp 16  BP (!) 95/57  BP Location Left Arm  BP Method Automatic  Patient Position (if appropriate) Lying  Oxygen Therapy  SpO2 97 %  O2 Device Room Air

## 2023-12-14 NOTE — Progress Notes (Signed)
 Patient presents pleasant and cooperative, somewhat demanding at times. Denies SI, HI, AVH. Prns given for sleep. Per patient not effective would like an increase of Trazodone . States she takes 150 mg of trazodone  at home. Patient noted in dayroom, socializing with peers. Did attend group and was active. Did not complain of back pain, has lidocaine  patch on states will take off in the morning. Encouragement and support provided. Safety checks maintained. Medications given as prescribed. Pt receptive and remains safe on unit with q 15 min checks.

## 2023-12-14 NOTE — Progress Notes (Signed)
 [facility] MD Progress Note  12/14/2023 10:47 AM [name]  MRN:  [id number] Subjective:  38-year-old Caucasian female who states, The Elavil  didn't work for me last night and requests specific medications, stating, I need the Wellbutrin  and Effexor . The patient reports a history of taking Wellbutrin  and describes it as helpful for managing ADHD symptoms. She also states, I take Unisom every night to get to sleep. The patient has a history of Effexor  and Ativan  overdose and is currently requesting Trazodone  for sleep.The patient appears alert and oriented but demonstrates a preoccupation with medication requests, including Wellbutrin  and benzodiazepines. She is currently on a CIWA protocol with a Klonopin  taper. There is a history of Effexor  and benzodiazepine use, which raises concerns about potential misuse. Principal Problem: Benzodiazepine (tranquilizer) overdose Diagnosis: Principal Problem:   Benzodiazepine (tranquilizer) overdose Active Problems:   Borderline personality disorder (HCC)   Cannabis abuse   History of drug use   Back pain   Overdose of venlafaxine   Total Time spent with patient: 1 hour  Past Psychiatric History: see below  Past Medical History:  Past Medical History:  Diagnosis Date   Borderline personality disorder (HCC) 04/16/2013   Cannabis abuse 2014   Chondromalacia patellae, right knee    Depression    Well-controlled with Zoloft    Gestational diabetes    Has prediabetes now.   Headache    Heroin abuse (HCC)    History of drug use 12/15/2017   IDA (iron deficiency anemia)    Migraines    Morbid obesity with BMI of 40.0-44.9, adult (HCC)    Nicotine  dependence with current use 06/26/2021   Osteoarthritis, knee     Past Surgical History:  Procedure Laterality Date   ANKLE SURGERY Left 12/07/2010   CESAREAN SECTION  12/07/2009   CESAREAN SECTION WITH BILATERAL TUBAL LIGATION Bilateral 06/15/2018   Procedure: CESAREAN SECTION WITH BILATERAL TUBAL  LIGATION;  Surgeon: Lovetta Debby PARAS, MD;  Location: [facility] ORS;  Service: Obstetrics;  Laterality: Bilateral;   CHOLECYSTECTOMY N/A 09/27/2016   Procedure: LAPAROSCOPIC CHOLECYSTECTOMY WITH INTRAOPERATIVE CHOLANGIOGRAM;  Surgeon: Charlie FORBES Fell, MD;  Location: [facility] ORS;  Service: General;  Laterality: N/A;   DILATION AND CURETTAGE OF UTERUS  12/08/2011   KNEE SURGERY Right 08/05/2022   TONSILLECTOMY  12/07/2005   Family History:  Family History  Problem Relation Age of Onset   Arthritis Mother    Cancer Mother        skin   Hyperlipidemia Mother        takes meds   Coronary artery disease Mother        non-obstructive   Cancer Father        Lung   Anxiety disorder Sister    Depression Sister    ADD / ADHD Son    Heart attack Maternal Grandfather 32   Coronary artery disease Maternal Grandfather 52       s/p CABG   Family Psychiatric  History: see above Social History:  Social History   Substance and Sexual Activity  Alcohol Use Yes     Social History   Substance and Sexual Activity  Drug Use Not Currently   Comment: history of heroin and marijuana use    Social History   Socioeconomic History   Marital status: Divorced    Spouse name: Not on file   Number of children: 2   Years of education: Not on file   Highest education level: Not on file  Occupational History  Employer: [employer]  Tobacco Use   Smoking status: Former    Current packs/day: 1.00    Average packs/day: 1 pack/day for 13.0 years (13.0 ttl pk-yrs)    Types: Cigarettes   Smokeless tobacco: Never  Vaping Use   Vaping status: Every Day  Substance and Sexual Activity   Alcohol use: Yes   Drug use: Not Currently    Comment: history of heroin and marijuana use   Sexual activity: Not Currently    Birth control/protection: Surgical    Comment: Tubal Ligation  Other Topics Concern   Not on file  Social History Narrative   Divorced mother of 2 (69 & 3 - as of 09/2021)   Works @  [employer]   NO routine exercise -- active @ work   Smokes 1 PPD - not interested in quitting.   Drinks lots of coffee (drinks cold coffee most of the day)   Social Drivers of Corporate Investment Banker Strain: Not on file  Food Insecurity: No Food Insecurity (12/13/2023)   Hunger Vital Sign    Worried About Running Out of Food in the Last Year: Never true    Ran Out of Food in the Last Year: Never true  Transportation Needs: No Transportation Needs (12/13/2023)   PRAPARE - Administrator, Civil Service (Medical): No    Lack of Transportation (Non-Medical): No  Physical Activity: Not on file  Stress: Not on file  Social Connections: Unknown (09/10/2022)   [facility] from [facility]   Social Network    Social Network: Not on file   Additional Social History:                         Sleep: Poor  Appetite:  Good  Current Medications: Current Facility-Administered Medications  Medication Dose Route Frequency Provider Last Rate Last Admin   acetaminophen  (TYLENOL ) tablet 650 mg  650 mg Oral Q6H PRN Melvenia Madelene HERO, NP       alum & mag hydroxide-simeth (MAALOX/MYLANTA) 200-200-20 MG/5ML suspension 30 mL  30 mL Oral Q4H PRN Melvenia Madelene HERO, NP       [START ON 12/15/2023] clonazePAM  (KLONOPIN ) disintegrating tablet 0.125 mg  0.125 mg Oral BID Demaris Leavell S, NP       clonazePAM  (KLONOPIN ) disintegrating tablet 1 mg  1 mg Oral BID Leslee Haueter S, NP   1 mg at 12/13/23 1600   clonazePAM  (KLONOPIN ) tablet 0.5 mg  0.5 mg Oral BID Niels Kayla FALCON, RPH       haloperidol  (HALDOL ) tablet 5 mg  5 mg Oral TID PRN Melvenia Madelene HERO, NP       And   diphenhydrAMINE  (BENADRYL ) capsule 50 mg  50 mg Oral TID PRN Melvenia Madelene HERO, NP       haloperidol  lactate (HALDOL ) injection 5 mg  5 mg Intramuscular TID PRN Melvenia Madelene HERO, NP       And   diphenhydrAMINE  (BENADRYL ) injection 50 mg  50 mg Intramuscular TID PRN Melvenia Madelene HERO, NP       And    LORazepam  (ATIVAN ) injection 2 mg  2 mg Intramuscular TID PRN Melvenia Madelene HERO, NP       haloperidol  lactate (HALDOL ) injection 10 mg  10 mg Intramuscular TID PRN Melvenia Madelene HERO, NP       And   diphenhydrAMINE  (BENADRYL ) injection 50 mg  50 mg Intramuscular TID PRN Melvenia Madelene HERO, NP  And   LORazepam  (ATIVAN ) injection 2 mg  2 mg Intramuscular TID PRN Melvenia Madelene HERO, NP       lamoTRIgine  (LAMICTAL ) tablet 100 mg  100 mg Oral Daily Nicholaus Brad RAMAN, NP   100 mg at 12/14/23 1017   lidocaine  (LIDODERM ) 5 % 1 patch  1 patch Transdermal Q24H Nicholaus Brad RAMAN, NP   1 patch at 12/13/23 1559   magnesium  hydroxide (MILK OF MAGNESIA) suspension 30 mL  30 mL Oral Daily PRN Melvenia Madelene HERO, NP       melatonin tablet 5 mg  5 mg Oral QHS Nicholaus Brad RAMAN, NP   5 mg at 12/13/23 2100   traZODone  (DESYREL ) tablet 100 mg  100 mg Oral QHS Nicholaus Brad RAMAN, NP       venlafaxine  XR (EFFEXOR -XR) 24 hr capsule 75 mg  75 mg Oral Q breakfast Nicholaus Brad RAMAN, NP        Lab Results:  Results for orders placed or performed during the hospital encounter of 12/12/23 (from the past 48 hours)  Lamotrigine  level     Status: Abnormal   Collection Time: 12/12/23 11:29 AM  Result Value Ref Range   Lamotrigine  Lvl 1.2 (L) 2.0 - 20.0 ug/mL    Comment: (NOTE)                                Detection Limit = 1.0 Performed At: [facility] [address] [place], [place] [id number] Jennette Shorter MD Ey:[id number]   Acetaminophen  level     Status: Abnormal   Collection Time: 12/12/23  1:42 PM  Result Value Ref Range   Acetaminophen  (Tylenol ), Serum <10 (L) 10 - 30 ug/mL    Comment: (NOTE) Therapeutic concentrations vary significantly. A range of 10-30 ug/mL  may be an effective concentration for many patients. However, some  are best treated at concentrations outside of this range. Acetaminophen  concentrations >150 ug/mL at 4 hours after ingestion  and >50 ug/mL at 12 hours after ingestion are often associated with   toxic reactions.  Performed at [facility], [address] [place], [place] [address]   Urine Drug Screen, Qualitative     Status: Abnormal   Collection Time: 12/13/23  2:44 AM  Result Value Ref Range   Tricyclic, Ur Screen POSITIVE (A) NONE DETECTED   Amphetamines, Ur Screen NONE DETECTED NONE DETECTED   MDMA (Ecstasy)Ur Screen NONE DETECTED NONE DETECTED   Cocaine Metabolite,Ur [place] NONE DETECTED NONE DETECTED   Opiate, Ur Screen NONE DETECTED NONE DETECTED   Phencyclidine (PCP) Ur S NONE DETECTED NONE DETECTED   Cannabinoid 50 Ng, Ur [place] POSITIVE (A) NONE DETECTED   Barbiturates, Ur Screen NONE DETECTED NONE DETECTED   Benzodiazepine, Ur Scrn POSITIVE (A) NONE DETECTED   Methadone Scn, Ur NONE DETECTED NONE DETECTED    Comment: (NOTE) Tricyclics + metabolites, urine    Cutoff 1000 ng/mL Amphetamines + metabolites, urine  Cutoff 1000 ng/mL MDMA (Ecstasy), urine              Cutoff 500 ng/mL Cocaine Metabolite, urine          Cutoff 300 ng/mL Opiate + metabolites, urine        Cutoff 300 ng/mL Phencyclidine (PCP), urine         Cutoff 25 ng/mL Cannabinoid, urine                 Cutoff 50 ng/mL  Barbiturates + metabolites, urine  Cutoff 200 ng/mL Benzodiazepine, urine              Cutoff 200 ng/mL Methadone, urine                   Cutoff 300 ng/mL  The urine drug screen provides only a preliminary, unconfirmed analytical test result and should not be used for non-medical purposes. Clinical consideration and professional judgment should be applied to any positive drug screen result due to possible interfering substances. A more specific alternate chemical method must be used in order to obtain a confirmed analytical result. Gas chromatography / mass spectrometry (GC/MS) is the preferred confirm atory method. Performed at [facility], [address] [place], [place] [address]   Urinalysis, Routine w reflex microscopic -Urine, Clean Catch     Status:  Abnormal   Collection Time: 12/13/23  2:44 AM  Result Value Ref Range   Color, Urine YELLOW (A) YELLOW   APPearance CLOUDY (A) CLEAR   Specific Gravity, Urine 1.016 1.005 - 1.030   pH 5.0 5.0 - 8.0   Glucose, UA NEGATIVE NEGATIVE mg/dL   Hgb urine dipstick NEGATIVE NEGATIVE   Bilirubin Urine NEGATIVE NEGATIVE   Ketones, ur NEGATIVE NEGATIVE mg/dL   Protein, ur 30 (A) NEGATIVE mg/dL   Nitrite NEGATIVE NEGATIVE   Leukocytes,Ua LARGE (A) NEGATIVE   RBC / HPF 11-20 0 - 5 RBC/hpf   WBC, UA >50 0 - 5 WBC/hpf   Bacteria, UA RARE (A) NONE SEEN   Squamous Epithelial / HPF 0-5 0 - 5 /HPF   WBC Clumps PRESENT    Mucus PRESENT     Comment: Performed at [facility], [address] [place], [place] [address]    Blood Alcohol level:  Lab Results  Component Value Date   Gi Or Norman <10 12/12/2023    Metabolic Disorder Labs: Lab Results  Component Value Date   HGBA1C 5.9 (H) 06/26/2021   Lab Results  Component Value Date   PROLACTIN 12.0 01/19/2022   Lab Results  Component Value Date   CHOL 227 (H) 08/05/2021   TRIG 215 (H) 08/05/2021   HDL 41 08/05/2021   CHOLHDL 5.5 (H) 08/05/2021   VLDL 70 (H) 07/29/2012   LDLCALC 147 (H) 08/05/2021   LDLCALC 98 07/29/2012    Physical Findings: AIMS:  , ,  ,  ,    CIWA:  CIWA-Ar Total: 0 COWS:     Musculoskeletal: Strength & Muscle Tone: within normal limits Gait & Station: normal Patient leans: N/A  Psychiatric Specialty Exam:  Presentation  General Appearance:  Fairly Groomed  Eye Contact: Good  Speech: Other (comment); Clear and Coherent (rapid)  Speech Volume: Normal  Handedness: Right   Mood and Affect  Mood: Anxious; Irritable (Frustrated)  Affect: Congruent; Restricted   Thought Process  Thought Processes: Goal Directed  Descriptions of Associations:Intact  Orientation:Partial  Thought Content:Logical (limited memory of overdose intent)  History of Schizophrenia/Schizoaffective  disorder:No  Duration of Psychotic Symptoms:No data recorded Hallucinations:Hallucinations: None  Ideas of Reference:None  Suicidal Thoughts:Suicidal Thoughts: No  Homicidal Thoughts:Homicidal Thoughts: No   Sensorium  Memory: Immediate Poor; Remote Poor  Judgment: Poor  Insight: Poor   Executive Functions  Concentration: Good  Attention Span: Poor  Recall: Poor  Fund of Knowledge: Good  Language: Good   Psychomotor Activity  Psychomotor Activity: Psychomotor Activity: Normal   Assets  Assets: Communication Skills; Housing; Financial Resources/Insurance   Sleep  Sleep: Sleep: Poor Number  of Hours of Sleep: 4    Physical Exam: Physical Exam Vitals and nursing note reviewed.  Constitutional:      Appearance: Normal appearance.  HENT:     Head: Normocephalic and atraumatic.     Nose: Nose normal.  Pulmonary:     Effort: Pulmonary effort is normal.  Musculoskeletal:        General: Normal range of motion.     Cervical back: Normal range of motion.  Neurological:     Mental Status: She is alert.  Psychiatric:        Attention and Perception: Attention and perception normal.        Mood and Affect: Affect is blunt and flat.        Speech: Speech normal.        Behavior: Behavior is hyperactive. Behavior is cooperative.        Thought Content: Thought content normal.        Cognition and Memory: Cognition is impaired. She exhibits impaired recent memory.        Judgment: Judgment is impulsive.    Review of Systems  Psychiatric/Behavioral:  Positive for substance abuse. The patient is nervous/anxious and has insomnia.   All other systems reviewed and are negative.  Blood pressure (!) 95/57, pulse 74, temperature 97.8 F (36.6 C), temperature source Oral, resp. rate 16, height 5' 4 (1.626 m), weight 93.4 kg, SpO2 97%. Body mass index is 35.36 kg/m.   Treatment Plan Summary: Daily contact with patient to assess and evaluate symptoms  and progress in treatment and Medication management Initiate Wellbutrin  75 mg daily with close monitoring. Discontinue Effexor  due to overdose history. Discontinue Elavil  as it was reported ineffective. Continue CIWA protocol with Klonopin  taper, monitor withdrawal symptoms. Assess ADHD symptoms more thoroughly to evaluate the appropriateness of Wellbutrin  for treatment. Reassess the need for benzodiazepines after completing the taper. Trazodone  100 mg nightly as needed for sleep, addressing insomnia Klonopin  taper Prevent withdrawal symptoms (e.g., seizures, anxiety, agitation CIWA Protocol Melatonin 3 mg nightly for sleep disturbances or insomnia Repeat EKG due to overdose Brad GORMAN Moats, NP 12/14/2023, 10:47 AM

## 2023-12-14 NOTE — Plan of Care (Signed)
  Problem: Education: Goal: Knowledge of [facility] Education information/materials will improve Outcome: Progressing Goal: Emotional status will improve Outcome: Progressing Goal: Mental status will improve Outcome: Progressing Goal: Verbalization of understanding the information provided will improve Outcome: Progressing   Problem: Activity: Goal: Interest or engagement in activities will improve Outcome: Progressing Goal: Sleeping patterns will improve Outcome: Progressing   Problem: Coping: Goal: Ability to verbalize frustrations and anger appropriately will improve Outcome: Progressing Goal: Ability to demonstrate self-control will improve Outcome: Progressing   Problem: Health Behavior/Discharge Planning: Goal: Identification of resources available to assist in meeting health care needs will improve Outcome: Progressing Goal: Compliance with treatment plan for underlying cause of condition will improve Outcome: Progressing   Problem: Physical Regulation: Goal: Ability to maintain clinical measurements within normal limits will improve Outcome: Progressing   Problem: Safety: Goal: Periods of time without injury will increase Outcome: Progressing   Problem: Coping: Goal: Coping ability will improve Outcome: Progressing   Problem: Health Behavior/Discharge Planning: Goal: Identification of resources available to assist in meeting health care needs will improve Outcome: Progressing   Problem: Self-Concept: Goal: Ability to disclose and discuss suicidal ideas will improve Outcome: Progressing

## 2023-12-14 NOTE — Progress Notes (Signed)
   12/14/23 1700  Psych Admission Type (Psych Patients Only)  Admission Status Involuntary  Psychosocial Assessment  Patient Complaints Anxiety;Sleep disturbance  Eye Contact Fair  Facial Expression Anxious;Worried  Affect Anxious;Preoccupied (with getting her correct medications)  Speech Logical/coherent  Interaction Assertive;Needy  Motor Activity Slow  Appearance/Hygiene In scrubs  Behavior Characteristics Cooperative;Appropriate to situation  Mood Anxious;Preoccupied;Pleasant  Aggressive Behavior  Effect No apparent injury  Thought Process  Coherency WDL  Content Blaming others (patient is blaming her mother)  Delusions None reported or observed  Perception WDL  Hallucination None reported or observed  Judgment Impaired  Confusion Mild  Danger to Self  Current suicidal ideation? Denies  Agreement Not to Harm Self Yes  Description of Agreement Verbal  Danger to Others  Danger to Others None reported or observed

## 2023-12-14 NOTE — Group Note (Signed)
 Recreation Therapy Group Note   Group Topic:Problem Solving  Group Date: 12/14/2023 Start Time: 1000 End Time: 1050 Facilitators: Celestia Jeoffrey BRAVO, LRT, CTRS Location:  Craft Room  Group Description: Life Boat. Patients were given the scenario that they are on a boat that is about to become shipwrecked, leaving them stranded on an island. They are asked to make a list of 15 different items that they want to take with them when they are [place] the [place]. Patients are asked to rank their items from most important to least important, #1 being the most important and #15 being the least. Patients will work individually for the first round to come up with 15 items and then pair up with a peer(s) to condense their list and come up with one list of 15 items between the two of them. Patients or LRT will read aloud the 15 different items to the group after each round. LRT facilitated post-activity processing to discuss how this activity can be used in daily life post discharge.   Goal Area(s) Addressed:  Patient will identify priorities, wants and needs. Patient will communicate with LRT and peers. Patient will work collectively as a administrator, civil service. Patient will work on product manager.    Affect/Mood: Appropriate   Participation Level: Active and Engaged   Participation Quality: Independent   Behavior: Appropriate, Calm, and Cooperative   Speech/Thought Process: Coherent   Insight: Good   Judgement: Good   Modes of Intervention: Exploration, Group work, Guided Discussion, and Team-building   Patient Response to Interventions:  Attentive, Engaged, Interested , and Receptive   Education Outcome:  Acknowledges education   Clinical Observations/Individualized Feedback: [name] was active in their participation of session activities and group discussion. Pt identified food, tooth brush, first aid kit, radio, phone as things she would [place] with [place] the [place]. Pt spontaneously  contributed to group discussion while interacting well with LRT and peers duration of session.    Plan: Continue to engage patient in RT group sessions 2-3x/week.   Jeoffrey BRAVO Celestia, LRT, CTRS 12/14/2023 12:23 PM

## 2023-12-15 DIAGNOSIS — T50901S Poisoning by unspecified drugs, medicaments and biological substances, accidental (unintentional), sequela: Secondary | ICD-10-CM

## 2023-12-15 DIAGNOSIS — T50901A Poisoning by unspecified drugs, medicaments and biological substances, accidental (unintentional), initial encounter: Secondary | ICD-10-CM | POA: Insufficient documentation

## 2023-12-15 MED ORDER — CIPROFLOXACIN HCL 500 MG PO TABS
500.0000 mg | ORAL_TABLET | Freq: Two times a day (BID) | ORAL | Status: DC
  Administered 2023-12-15 – 2023-12-16 (×3): 500 mg via ORAL
  Filled 2023-12-15 (×6): qty 1

## 2023-12-15 MED ORDER — METRONIDAZOLE 500 MG PO TABS
500.0000 mg | ORAL_TABLET | Freq: Two times a day (BID) | ORAL | Status: DC
  Administered 2023-12-15 – 2023-12-16 (×3): 500 mg via ORAL
  Filled 2023-12-15 (×3): qty 1

## 2023-12-15 NOTE — Group Note (Signed)
 Date:  12/15/2023 Time:  5:43 PM  Group Topic/Focus:  Coping With Mental Health Crisis:   The purpose of this group is to help patients identify strategies for coping with mental health crisis.  Group discusses possible causes of crisis and ways to manage them effectively. Art Therapy video driven -Art therapy is an effective treatment for persons experiencing developmental, medical, educational, social or psychological impairment. A key goal in art therapy is to improve or restore the client's functioning and his/her sense of personal well being.    Participation Level:  Active  Participation Quality:  Appropriate  Affect:  Appropriate  Cognitive:  Alert and Appropriate  Insight: Appropriate  Engagement in Group:  Developing/Improving and Engaged  Modes of Intervention:  Activity  Additional Comments:    Mitsuo Budnick 12/15/2023, 5:43 PM

## 2023-12-15 NOTE — Group Note (Signed)
 Date:  12/15/2023 Time:  11:10 PM  Group Topic/Focus:  Personal Choices and Values:   The focus of this group is to help patients assess and explore the importance of values in their lives, how their values affect their decisions, how they express their values and what opposes their expression.    Participation Level:  Active  Participation Quality:  Appropriate and Attentive  Affect:  Appropriate and Excited  Cognitive:  Alert and Appropriate  Insight: Appropriate, Good, and Improving  Engagement in Group:  Developing/Improving, Engaged, and Improving  Modes of Intervention:  Activity, Clarification, Discussion, Education, Problem-solving, Rapport Building, Socialization, and Support  Additional Comments:     [name] 12/15/2023, 11:10 PM

## 2023-12-15 NOTE — Group Note (Signed)
 Date:  12/15/2023 Time:  11:35 AM  Group Topic/Focus:  Goals Group:   The focus of this group is to help patients establish daily goals to achieve during treatment and discuss how the patient can incorporate goal setting into their daily lives to aide in recovery. Personal Choices and Values:   The focus of this group is to help patients assess and explore the importance of values in their lives, how their values affect their decisions, how they express their values and what opposes their expression. Rediscovering Joy:   The focus of this group is to explore various ways to relieve stress in a positive manner.    Participation Level:  Active  Participation Quality:  Appropriate  Affect:  Appropriate  Cognitive:  Appropriate  Insight: Appropriate  Engagement in Group:  Developing/Improving  Modes of Intervention:  Activity and Discussion  Additional Comments:    [name] 12/15/2023, 11:35 AM

## 2023-12-15 NOTE — Group Note (Signed)
 [facility] LCSW Group Therapy Note   Group Date: 12/15/2023 Start Time: 1300 End Time: 1440   Type of Therapy/Topic:  Group Therapy:  Emotion Regulation  Participation Level:  Active    Description of Group:    The purpose of this group is to assist patients in learning to regulate negative emotions and experience positive emotions. Patients will be guided to discuss ways in which they have been vulnerable to their negative emotions. These vulnerabilities will be juxtaposed with experiences of positive emotions or situations, and patients challenged to use positive emotions to combat negative ones. Special emphasis will be placed on coping with negative emotions in conflict situations, and patients will process healthy conflict resolution skills.  Therapeutic Goals: Patient will identify two positive emotions or experiences to reflect on in order to balance out negative emotions:  Patient will label two or more emotions that they find the most difficult to experience:  Patient will be able to demonstrate positive conflict resolution skills through discussion or role plays:   Summary of Patient Progress: Patient was present for the entirety of the group process. She participated in the discussion and her responses/behavior were appropriate. Pt appeared open and receptive to feedback/comments from her peers and facilitator.   Therapeutic Modalities:   Cognitive Behavioral Therapy Feelings Identification Dialectical Behavioral Therapy   Nadara JONELLE Fam, LCSW

## 2023-12-15 NOTE — Progress Notes (Signed)
 [facility] MD Progress Note  12/15/2023 4:43 PM [name]  MRN:  [id number] Subjective:  38 year old Caucasian female who stated, I am ready to go home, and reported concerns about an infection in my vagina. A urinalysis (UA) was collected, and the attending physician was notified. Antibiotics were initiated. She reported ongoing anxiety and sleep disturbances and remains preoccupied with ensuring her medications are correct. The patient denies current suicidal ideation (SI) or homicidal ideation (HI) and verbally agreed not to harm herself.The patient expressed frustration and blamed her mother for her current situation but otherwise demonstrated logical and coherent speech during the assessment.  Principal Problem: Benzodiazepine (tranquilizer) overdose Diagnosis: Principal Problem:   Benzodiazepine (tranquilizer) overdose Active Problems:   Borderline personality disorder (HCC)   Cannabis abuse   History of drug use   Back pain   Overdose of venlafaxine   Total Time spent with patient: 45 minutes  Past Psychiatric History: see below  Past Medical History:  Past Medical History:  Diagnosis Date   Borderline personality disorder (HCC) 04/16/2013   Cannabis abuse 2014   Chondromalacia patellae, right knee    Depression    Well-controlled with Zoloft    Gestational diabetes    Has prediabetes now.   Headache    Heroin abuse (HCC)    History of drug use 12/15/2017   IDA (iron deficiency anemia)    Migraines    Morbid obesity with BMI of 40.0-44.9, adult (HCC)    Nicotine  dependence with current use 06/26/2021   Osteoarthritis, knee     Past Surgical History:  Procedure Laterality Date   ANKLE SURGERY Left 12/07/2010   CESAREAN SECTION  12/07/2009   CESAREAN SECTION WITH BILATERAL TUBAL LIGATION Bilateral 06/15/2018   Procedure: CESAREAN SECTION WITH BILATERAL TUBAL LIGATION;  Surgeon: Lovetta Debby PARAS, MD;  Location: [facility] ORS;  Service: Obstetrics;  Laterality:  Bilateral;   CHOLECYSTECTOMY N/A 09/27/2016   Procedure: LAPAROSCOPIC CHOLECYSTECTOMY WITH INTRAOPERATIVE CHOLANGIOGRAM;  Surgeon: Charlie FORBES Fell, MD;  Location: [facility] ORS;  Service: General;  Laterality: N/A;   DILATION AND CURETTAGE OF UTERUS  12/08/2011   KNEE SURGERY Right 08/05/2022   TONSILLECTOMY  12/07/2005   Family History:  Family History  Problem Relation Age of Onset   Arthritis Mother    Cancer Mother        skin   Hyperlipidemia Mother        takes meds   Coronary artery disease Mother        non-obstructive   Cancer Father        Lung   Anxiety disorder Sister    Depression Sister    ADD / ADHD Son    Heart attack Maternal Grandfather 32   Coronary artery disease Maternal Grandfather 94       s/p CABG   Family Psychiatric  History: see above Social History:  Social History   Substance and Sexual Activity  Alcohol Use Yes     Social History   Substance and Sexual Activity  Drug Use Not Currently   Comment: history of heroin and marijuana use    Social History   Socioeconomic History   Marital status: Divorced    Spouse name: Not on file   Number of children: 2   Years of education: Not on file   Highest education level: Not on file  Occupational History    Employer: [employer]  Tobacco Use   Smoking status: Former    Current packs/day: 1.00    Average packs/day:  1 pack/day for 13.0 years (13.0 ttl pk-yrs)    Types: Cigarettes   Smokeless tobacco: Never  Vaping Use   Vaping status: Every Day  Substance and Sexual Activity   Alcohol use: Yes   Drug use: Not Currently    Comment: history of heroin and marijuana use   Sexual activity: Not Currently    Birth control/protection: Surgical    Comment: Tubal Ligation  Other Topics Concern   Not on file  Social History Narrative   Divorced mother of 2 (45 & 3 - as of 09/2021)   Works @ [employer]   NO routine exercise -- active @ work   Smokes 1 PPD - not interested in quitting.    Drinks lots of coffee (drinks cold coffee most of the day)   Social Drivers of Corporate Investment Banker Strain: Not on file  Food Insecurity: No Food Insecurity (12/13/2023)   Hunger Vital Sign    Worried About Running Out of Food in the Last Year: Never true    Ran Out of Food in the Last Year: Never true  Transportation Needs: No Transportation Needs (12/13/2023)   PRAPARE - Administrator, Civil Service (Medical): No    Lack of Transportation (Non-Medical): No  Physical Activity: Not on file  Stress: Not on file  Social Connections: Unknown (09/10/2022)   Received from [facility]   Social Network    Social Network: Not on file   Additional Social History:                         Sleep: Good  Appetite:  Good  Current Medications: Current Facility-Administered Medications  Medication Dose Route Frequency Provider Last Rate Last Admin   acetaminophen  (TYLENOL ) tablet 650 mg  650 mg Oral Q6H PRN Melvenia Madelene HERO, NP       alum & mag hydroxide-simeth (MAALOX/MYLANTA) 200-200-20 MG/5ML suspension 30 mL  30 mL Oral Q4H PRN Dixon, Rashaun M, NP       buPROPion  (WELLBUTRIN ) tablet 75 mg  75 mg Oral Daily Nicholaus Brad RAMAN, NP   75 mg at 12/15/23 9176   ciprofloxacin  (CIPRO ) tablet 500 mg  500 mg Oral BID Djan, Prince T, MD   500 mg at 12/15/23 1258   clonazePAM  (KLONOPIN ) disintegrating tablet 0.125 mg  0.125 mg Oral BID Nicholaus Brad RAMAN, NP       haloperidol  (HALDOL ) tablet 5 mg  5 mg Oral TID PRN Melvenia Madelene HERO, NP       And   diphenhydrAMINE  (BENADRYL ) capsule 50 mg  50 mg Oral TID PRN Melvenia Madelene HERO, NP       haloperidol  lactate (HALDOL ) injection 5 mg  5 mg Intramuscular TID PRN Melvenia Madelene HERO, NP       And   diphenhydrAMINE  (BENADRYL ) injection 50 mg  50 mg Intramuscular TID PRN Melvenia Madelene HERO, NP       And   LORazepam  (ATIVAN ) injection 2 mg  2 mg Intramuscular TID PRN Melvenia Madelene HERO, NP       haloperidol  lactate (HALDOL )  injection 10 mg  10 mg Intramuscular TID PRN Melvenia Madelene HERO, NP       And   diphenhydrAMINE  (BENADRYL ) injection 50 mg  50 mg Intramuscular TID PRN Melvenia Madelene HERO, NP       And   LORazepam  (ATIVAN ) injection 2 mg  2 mg Intramuscular TID PRN Dixon, Rashaun M,  NP       lamoTRIgine  (LAMICTAL ) tablet 100 mg  100 mg Oral Daily Nicholaus Brad RAMAN, NP   100 mg at 12/15/23 9176   lidocaine  (LIDODERM ) 5 % 1 patch  1 patch Transdermal Q24H Nicholaus Brad RAMAN, NP   1 patch at 12/15/23 1537   magnesium  hydroxide (MILK OF MAGNESIA) suspension 30 mL  30 mL Oral Daily PRN Melvenia Madelene HERO, NP   30 mL at 12/14/23 1701   melatonin tablet 5 mg  5 mg Oral QHS Nicholaus Brad RAMAN, NP   5 mg at 12/14/23 2059   metroNIDAZOLE  (FLAGYL ) tablet 500 mg  500 mg Oral BID Dorinda Drue DASEN, MD   500 mg at 12/15/23 1012   nicotine  polacrilex (NICORETTE ) gum 2 mg  2 mg Oral PRN Victoria Ruts, MD   2 mg at 12/15/23 1538   traZODone  (DESYREL ) tablet 100 mg  100 mg Oral QHS Nicholaus Brad RAMAN, NP   100 mg at 12/14/23 2059    Lab Results:  Results for orders placed or performed during the hospital encounter of 12/13/23 (from the past 48 hours)  Urinalysis, Complete w Microscopic -Urine, Clean Catch     Status: Abnormal   Collection Time: 12/14/23  3:05 PM  Result Value Ref Range   Color, Urine YELLOW (A) YELLOW   APPearance CLOUDY (A) CLEAR   Specific Gravity, Urine 1.011 1.005 - 1.030   pH 6.0 5.0 - 8.0   Glucose, UA NEGATIVE NEGATIVE mg/dL   Hgb urine dipstick SMALL (A) NEGATIVE   Bilirubin Urine NEGATIVE NEGATIVE   Ketones, ur NEGATIVE NEGATIVE mg/dL   Protein, ur NEGATIVE NEGATIVE mg/dL   Nitrite NEGATIVE NEGATIVE   Leukocytes,Ua LARGE (A) NEGATIVE   RBC / HPF 21-50 0 - 5 RBC/hpf   WBC, UA >50 0 - 5 WBC/hpf   Bacteria, UA RARE (A) NONE SEEN   Squamous Epithelial / HPF 6-10 0 - 5 /HPF   Mucus PRESENT     Comment: Performed at [facility], [address] [place], [place] [address]    Blood Alcohol level:  Lab  Results  Component Value Date   [facility] <10 12/12/2023    Metabolic Disorder Labs: Lab Results  Component Value Date   HGBA1C 5.9 (H) 06/26/2021   Lab Results  Component Value Date   PROLACTIN 12.0 01/19/2022   Lab Results  Component Value Date   CHOL 227 (H) 08/05/2021   TRIG 215 (H) 08/05/2021   HDL 41 08/05/2021   CHOLHDL 5.5 (H) 08/05/2021   VLDL 70 (H) 07/29/2012   LDLCALC 147 (H) 08/05/2021   LDLCALC 98 07/29/2012    Physical Findings: AIMS:  , ,  ,  ,    CIWA:  CIWA-Ar Total: 0 COWS:     Musculoskeletal: Strength & Muscle Tone: within normal limits Gait & Station: normal Patient leans: N/A  Psychiatric Specialty Exam:  Presentation  General Appearance:  Fairly Groomed  Eye Contact: Good  Speech: Normal Rate  Speech Volume: Increased  Handedness: Right   Mood and Affect  Mood: Anxious; Irritable (frustrated)  Affect: Flat   Thought Process  Thought Processes: Coherent  Descriptions of Associations:Intact  Orientation:Full (Time, Place and Person)  Thought Content:Rumination  History of Schizophrenia/Schizoaffective disorder:No  Duration of Psychotic Symptoms:No data recorded Hallucinations:Hallucinations: None  Ideas of Reference:None  Suicidal Thoughts:Suicidal Thoughts: No  Homicidal Thoughts:Homicidal Thoughts: No   Sensorium  Memory: Immediate Poor; Remote Fair  Judgment: -- (limited)  Insight: Lacking  Executive Functions  Concentration: Poor  Attention Span: Poor (focused on Wellbutrin )  Recall: Fair  Fund of Knowledge: Good  Language: Good   Psychomotor Activity  Psychomotor Activity: Psychomotor Activity: Normal   Assets  Assets: Housing; Manufacturing Systems Engineer; Financial Resources/Insurance   Sleep  Sleep: Sleep: Poor Number of Hours of Sleep: 2    Physical Exam: Physical Exam Vitals and nursing note reviewed.  Constitutional:      Appearance: Normal appearance.  HENT:      Head: Normocephalic and atraumatic.     Nose: Nose normal.  Pulmonary:     Effort: Pulmonary effort is normal.  Musculoskeletal:        General: Normal range of motion.     Cervical back: Normal range of motion.  Neurological:     General: No focal deficit present.     Mental Status: She is alert and oriented to person, place, and time. Mental status is at baseline.  Psychiatric:        Attention and Perception: Attention and perception normal.        Mood and Affect: Mood is anxious. Affect is flat.        Speech: Speech normal.        Behavior: Behavior normal. Behavior is cooperative.        Thought Content: Thought content normal.        Cognition and Memory: Cognition and memory normal.        Judgment: Judgment is impulsive.    Review of Systems  Genitourinary:  Positive for dysuria and urgency.  Psychiatric/Behavioral:  The patient is nervous/anxious.   All other systems reviewed and are negative.  Blood pressure 125/74, pulse 91, temperature 98.5 F (36.9 C), temperature source Oral, resp. rate 20, height 5' 4 (1.626 m), weight 93.4 kg, SpO2 100%. Body mass index is 35.36 kg/m.   Treatment Plan Summary: Daily contact with patient to assess and evaluate symptoms and progress in treatment and Medication management Wellbutrin  75 mg daily with close monitoring. Continue CIWA protocol with Klonopin  taper, monitor withdrawal symptoms. Assess ADHD symptoms more thoroughly to evaluate the appropriateness of Wellbutrin  for treatment. Reassess the need for benzodiazepines after completing the taper. Trazodone  100 mg nightly as needed for sleep, addressing insomnia Klonopin  taper Prevent withdrawal symptoms (e.g., seizures, anxiety, agitation Continue antibiotics for the reported vaginal infection as prescribed by the attending physician. Monitor for signs of improvement or adverse reactions to treatment. CIWA Protocol Brad GORMAN Moats, NP 12/15/2023, 4:43 PM

## 2023-12-15 NOTE — Progress Notes (Signed)
   12/15/23 1051  Psych Admission Type (Psych Patients Only)  Admission Status Involuntary  Psychosocial Assessment  Patient Complaints Anxiety  Eye Contact Brief  Facial Expression Anxious  Affect Anxious;Blunted  Speech Logical/coherent  Interaction Assertive;Attention-seeking;Demanding;Needy  Motor Activity Pacing  Appearance/Hygiene Unremarkable  Behavior Characteristics Cooperative;Anxious;Pacing  Mood Anxious  Thought Process  Coherency WDL  Content Blaming others  Delusions None reported or observed  Perception WDL  Hallucination None reported or observed  Judgment Impaired  Confusion Mild  Danger to Self  Current suicidal ideation? Denies  Agreement Not to Harm Self Yes  Description of Agreement verbal  Danger to Others  Danger to Others None reported or observed   Started on ABT for UTI. No adverse reaction noted. Educated to drink lots of fluids and report if symptoms gets worse. Denies SI/HI or self harm.

## 2023-12-16 DIAGNOSIS — F319 Bipolar disorder, unspecified: Principal | ICD-10-CM

## 2023-12-16 DIAGNOSIS — F3170 Bipolar disorder, currently in remission, most recent episode unspecified: Secondary | ICD-10-CM

## 2023-12-16 MED ORDER — LAMOTRIGINE 100 MG PO TABS: 100.0000 mg | ORAL_TABLET | Freq: Every day | ORAL | 0 refills | Status: DC

## 2023-12-16 MED ORDER — MELATONIN 5 MG PO TABS: 5.0000 mg | ORAL_TABLET | Freq: Every day | ORAL | 0 refills | Status: AC

## 2023-12-16 MED ORDER — LIDOCAINE 5 % EX PTCH: 1.0000 | MEDICATED_PATCH | CUTANEOUS | 0 refills | Status: AC

## 2023-12-16 MED ORDER — METRONIDAZOLE 500 MG PO TABS: 500.0000 mg | ORAL_TABLET | Freq: Two times a day (BID) | ORAL | 0 refills | Status: AC

## 2023-12-16 MED ORDER — BUPROPION HCL 75 MG PO TABS: 75.0000 mg | ORAL_TABLET | Freq: Every day | ORAL | 0 refills | Status: DC

## 2023-12-16 MED ORDER — CIPROFLOXACIN HCL 500 MG PO TABS: 500.0000 mg | ORAL_TABLET | Freq: Two times a day (BID) | ORAL | 0 refills | Status: AC

## 2023-12-16 MED ORDER — TRAZODONE HCL 100 MG PO TABS: 100.0000 mg | ORAL_TABLET | Freq: Every day | ORAL | 0 refills | Status: DC

## 2023-12-16 MED ORDER — NICOTINE POLACRILEX 4 MG MT GUM: 4.0000 mg | CHEWING_GUM | OROMUCOSAL | 0 refills | Status: AC | PRN

## 2023-12-16 NOTE — Progress Notes (Signed)
  [facility] Adult Case Management Discharge Plan :  Will you be returning to the same living situation after discharge:  Yes,  Patient to return home.  At discharge, do you have transportation home?: Yes,  Patient's mother to provide transportation.  Do you have the ability to pay for your medications: Yes, [employer] / [employer]   Release of information consent forms completed and in the chart;  Patient's signature needed at discharge.  Patient to Follow up at:  Follow-up Information     [facility]. Go to.   Why: In person appointment made for 12/26/22 at 11 AM. Contact information: [address] [place] [address] [phone]                 Next level of care provider has access to [facility] Link:no  Safety Planning and Suicide Prevention discussed: No.Patient declined. SPE completed with pt, as pt refused to consent to family contact. SPI pamphlet provided to pt and pt was encouraged to share information with support network, ask questions, and talk about any concerns relating to SPE. Pt denies access to guns/firearms and verbalized understanding of information provided. Mobile Crisis information also provided to pt. SPE material provided at discharge.      Has patient been referred to the Quitline?: Patient refused referral for treatment  Patient has been referred for addiction treatment: Yes, the patient will follow up with an outpatient provider for substance use disorder. Therapist: appointment made with [facility].   Ariana CHRISTELLA Kerns, LCSW 12/16/2023, 8:37 AM

## 2023-12-16 NOTE — Discharge Summary (Addendum)
 Physician Discharge Summary Note  Patient:  [name] is an 38 y.o., female MRN:  [id number] DOB:  1986/08/03 Patient phone:  [phone] (home)  Patient address:   [address] [place] [address],  Total Time spent with patient: 2 hours  Date of Admission:  12/13/2023 Date of Discharge: 12/16/2023  Reason for Admission:  39 year old Caucasian female with a history of Borderline Personality Disorder (BPD), Major Depressive Disorder (MDD), ADHD, cannabis abuse, heroin abuse and cocaine abuse, was admitted due to suicidal behavior following a suspected drug overdose. The patient was brought to the emergency department (ED) by EMS and police after her mother reported concerns that the patient had ingested an entire bottle of medication.The patient exhibited confusion and memory difficulties, unable to recall events leading to her admission, and misidentified the year as 2005.Presented with undertreated depression and suicidal behavior, consistent with her history of overdoses.Tested positive for benzodiazepines and cannabinoids despite denying substance use. Demonstrated poor insight and was guarded and reticent during the assessment.History of suicidal behavior and overdoses.Underlying untreated or poorly managed depressive symptoms. Lack of adherence to outpatient mental health care.Agitation and confusion upon arrival, requiring Versed  and Haldol  for stabilization.The patient met the criteria for involuntary psychiatric hospitalization (IVC) due to the immediate risk of self-harm, poor insight, and the need for stabilization and treatment of depressive symptoms and suicidal ideation.  Principal Problem: Bipolar disorder [facility]) Discharge Diagnoses: Principal Problem:   Bipolar disorder (HCC) Active Problems:   Cannabis abuse   Overdose of venlafaxine    Benzodiazepine (tranquilizer) overdose   Past Psychiatric History: see below  Past Medical History:  Past Medical History:   Diagnosis Date   Borderline personality disorder (HCC) 04/16/2013   Cannabis abuse 2014   Chondromalacia patellae, right knee    Depression    Well-controlled with Zoloft    Gestational diabetes    Has prediabetes now.   Headache    Heroin abuse (HCC)    History of drug use 12/15/2017   IDA (iron deficiency anemia)    Migraines    Morbid obesity with BMI of 40.0-44.9, adult (HCC)    Nicotine  dependence with current use 06/26/2021   Osteoarthritis, knee     Past Surgical History:  Procedure Laterality Date   ANKLE SURGERY Left 12/07/2010   CESAREAN SECTION  12/07/2009   CESAREAN SECTION WITH BILATERAL TUBAL LIGATION Bilateral 06/15/2018   Procedure: CESAREAN SECTION WITH BILATERAL TUBAL LIGATION;  Surgeon: Lovetta Debby PARAS, MD;  Location: [facility] ORS;  Service: Obstetrics;  Laterality: Bilateral;   CHOLECYSTECTOMY N/A 09/27/2016   Procedure: LAPAROSCOPIC CHOLECYSTECTOMY WITH INTRAOPERATIVE CHOLANGIOGRAM;  Surgeon: Charlie FORBES Fell, MD;  Location: [facility] ORS;  Service: General;  Laterality: N/A;   DILATION AND CURETTAGE OF UTERUS  12/08/2011   KNEE SURGERY Right 08/05/2022   TONSILLECTOMY  12/07/2005   Family History:  Family History  Problem Relation Age of Onset   Arthritis Mother    Cancer Mother        skin   Hyperlipidemia Mother        takes meds   Coronary artery disease Mother        non-obstructive   Cancer Father        Lung   Anxiety disorder Sister    Depression Sister    ADD / ADHD Son    Heart attack Maternal Grandfather 32   Coronary artery disease Maternal Grandfather 64       s/p CABG   Family Psychiatric  History: see above Social History:  Social History   Substance and Sexual Activity  Alcohol Use Yes     Social History   Substance and Sexual Activity  Drug Use Not Currently   Comment: history of heroin and marijuana use    Social History   Socioeconomic History   Marital status: Divorced    Spouse name: Not on file   Number of  children: 2   Years of education: Not on file   Highest education level: Not on file  Occupational History    Employer: [employer]  Tobacco Use   Smoking status: Former    Current packs/day: 1.00    Average packs/day: 1 pack/day for 13.0 years (13.0 ttl pk-yrs)    Types: Cigarettes   Smokeless tobacco: Never  Vaping Use   Vaping status: Every Day  Substance and Sexual Activity   Alcohol use: Yes   Drug use: Not Currently    Comment: history of heroin and marijuana use   Sexual activity: Not Currently    Birth control/protection: Surgical    Comment: Tubal Ligation  Other Topics Concern   Not on file  Social History Narrative   Divorced mother of 2 (65 & 3 - as of 09/2021)   Works @ [employer]   NO routine exercise -- active @ work   Smokes 1 PPD - not interested in quitting.   Drinks lots of coffee (drinks cold coffee most of the day)   Social Drivers of Corporate Investment Banker Strain: Not on file  Food Insecurity: No Food Insecurity (12/13/2023)   Hunger Vital Sign    Worried About Running Out of Food in the Last Year: Never true    Ran Out of Food in the Last Year: Never true  Transportation Needs: No Transportation Needs (12/13/2023)   PRAPARE - Administrator, Civil Service (Medical): No    Lack of Transportation (Non-Medical): No  Physical Activity: Not on file  Stress: Not on file  Social Connections: Unknown (09/10/2022)   Received from [facility]   Social Network    Social Network: Not on file    Hospital Course:  The patient was admitted involuntarily under IVC due to a suicide attempt via suspected drug overdose. She presented to the ED with confusion, agitation, and suicidal behavior after her mother reported concerns about the ingestion of an entire bottle of medication. EMS provided two full bottles of Venlafaxine  suspected to have been recently filled. On arrival, the patient was disoriented (stating the year as  2005), exhibited poor insight, and was agitated, requiring 5 mg Versed  and 2 mg Haldol  for stabilization. Regular monitoring for suicidal ideation and mood stabilization.Engaged the patient in supportive therapy sessions, focusing on building insight and understanding the importance of ongoing treatment. Psychoeducation was provided on medication adherence and the impact of substance use on mental health. Explored barriers to outpatient care and developed a discharge plan to connect the patient to necessary resources.The patient demonstrated stabilization of mood with Lamotrigine  and Wellbutrin . Sleep quality improved with Trazodone , and no significant withdrawal symptoms were observed during the Klonopin  taper.The patient remained guarded but less agitated as treatment progressed, with no acute episodes of suicidal ideation or self-harm during the hospital course.Insight remained limited, but the patient expressed willingness to engage in outpatient care upon discharge.The patient was discharged in stable condition with the following plan:Psychiatry follow-up within 7 days for medication monitoring and adjustment. Therapy sessions prioritizing Dialectical Behavior Therapy (DBT) for emotional regulation.Follow-up with  the primary care provider for overall health monitoring.The patient was discharged to the care of her mother with an understanding of her treatment plan and the importance of follow-up care.         Physical Findings: AIMS:  , ,  ,  ,    CIWA:  CIWA-Ar Total: 0 COWS:     Musculoskeletal: Strength & Muscle Tone: within normal limits Gait & Station: normal Patient leans: N/A   Psychiatric Specialty Exam:  Presentation  General Appearance:  Appropriate for Environment; Neat  Eye Contact: Good  Speech: Clear and Coherent; Normal Rate  Speech Volume: Normal  Handedness: Right   Mood and Affect  Mood: Euthymic  Affect: Flat; Congruent   Thought Process   Thought Processes: Coherent  Descriptions of Associations:Intact  Orientation:Full (Time, Place and Person) (and situation)  Thought Content:WDL  History of Schizophrenia/Schizoaffective disorder:No  Duration of Psychotic Symptoms:No data recorded Hallucinations:Hallucinations: None  Ideas of Reference:None  Suicidal Thoughts:Suicidal Thoughts: No  Homicidal Thoughts:Homicidal Thoughts: No   Sensorium  Memory: Remote Fair; Immediate Good  Judgment: Fair  Insight: Fair   Art Therapist  Concentration: Fair  Attention Span: Fair  Recall: Good  Fund of Knowledge: Good  Language: Good   Psychomotor Activity  Psychomotor Activity: Psychomotor Activity: Normal   Assets  Assets: Housing; Manufacturing Systems Engineer; Social Support; Health And Safety Inspector   Sleep  Sleep: Sleep: Good Number of Hours of Sleep: 7    Physical Exam: Physical Exam Vitals and nursing note reviewed.  Constitutional:      Appearance: Normal appearance.  HENT:     Head: Normocephalic and atraumatic.     Nose: Nose normal.  Pulmonary:     Effort: Pulmonary effort is normal.  Musculoskeletal:        General: Normal range of motion.     Cervical back: Normal range of motion.  Neurological:     General: No focal deficit present.     Mental Status: She is alert and oriented to person, place, and time. Mental status is at baseline.  Psychiatric:        Attention and Perception: Attention and perception normal.        Mood and Affect: Mood and affect normal.        Speech: Speech normal.        Behavior: Behavior normal. Behavior is cooperative.        Thought Content: Thought content normal.        Cognition and Memory: Cognition and memory normal.        Judgment: Judgment normal.    Review of Systems  All other systems reviewed and are negative.  Blood pressure (!) 92/58, pulse 70, temperature 97.6 F (36.4 C), temperature source Oral, resp. rate 20,  height 5' 4 (1.626 m), weight 93.4 kg, SpO2 93%. Body mass index is 35.36 kg/m.   Social History   Tobacco Use  Smoking Status Former   Current packs/day: 1.00   Average packs/day: 1 pack/day for 13.0 years (13.0 ttl pk-yrs)   Types: Cigarettes  Smokeless Tobacco Never   Tobacco Cessation:  A prescription for an FDA-approved tobacco cessation medication provided at discharge   Blood Alcohol level:  Lab Results  Component Value Date   [facility] <10 12/12/2023    Metabolic Disorder Labs:  Lab Results  Component Value Date   HGBA1C 5.9 (H) 06/26/2021   Lab Results  Component Value Date   PROLACTIN 12.0 01/19/2022   Lab Results  Component Value Date  CHOL 227 (H) 08/05/2021   TRIG 215 (H) 08/05/2021   HDL 41 08/05/2021   CHOLHDL 5.5 (H) 08/05/2021   VLDL 70 (H) 07/29/2012   LDLCALC 147 (H) 08/05/2021   LDLCALC 98 07/29/2012    See Psychiatric Specialty Exam and Suicide Risk Assessment completed by Attending Physician prior to discharge.  Discharge destination:  Home  Is patient on multiple antipsychotic therapies at discharge:  Yes,   Do you recommend tapering to monotherapy for antipsychotics?  Negative   Has Patient had three or more failed trials of antipsychotic monotherapy by history:  No  Recommended Plan for Multiple Antipsychotic Therapies: NA   Allergies as of 12/16/2023       Reactions   Clindamycin/lincomycin Anaphylaxis, Rash   Gabapentin Swelling   Leg swelling   Amoxicillin Rash   Has patient had a PCN reaction causing immediate rash, facial/tongue/throat swelling, SOB or lightheadedness with hypotension: Yes Has patient had a PCN reaction causing severe rash involving mucus membranes or skin necrosis: No Has patient had a PCN reaction that required hospitalization: No Has patient had a PCN reaction occurring within the last 10 years: No If all of the above answers are NO, then may proceed with Cephalosporin use.        Medication List      STOP taking these medications    LORazepam  1 MG tablet Commonly known as: ATIVAN    venlafaxine  100 MG tablet Commonly known as: EFFEXOR    venlafaxine  75 MG tablet Commonly known as: EFFEXOR        TAKE these medications      Indication  baclofen  20 MG tablet Commonly known as: LIORESAL  Take 1 tablet (20 mg total) by mouth 3 (three) times daily.  Indication: Muscle Spasm, Muscle Spasticity   buPROPion  75 MG tablet Commonly known as: WELLBUTRIN  Take 1 tablet (75 mg total) by mouth daily. Start taking on: December 17, 2023  Indication: Major Depressive Disorder   ciprofloxacin  500 MG tablet Commonly known as: CIPRO  Take 1 tablet (500 mg total) by mouth 2 (two) times daily for 4 days.  Indication: Urinary Tract Infection   lamoTRIgine  100 MG tablet Commonly known as: LAMICTAL  Take 1 tablet (100 mg total) by mouth daily. Start taking on: December 17, 2023 What changed:  medication strength how much to take  Indication: Manic-Depression   lidocaine  5 % Commonly known as: LIDODERM  Place 1 patch onto the skin daily for 10 days. Remove & Discard patch within 12 hours or as directed by MD  Indication: Allodynia   melatonin 5 MG Tabs Take 1 tablet (5 mg total) by mouth at bedtime.  Indication: Trouble Sleeping   metroNIDAZOLE  500 MG tablet Commonly known as: FLAGYL  Take 1 tablet (500 mg total) by mouth 2 (two) times daily for 4 days.  Indication: Vaginosis caused by Bacteria   nicotine  polacrilex 4 MG gum Commonly known as: NICORETTE  Take 1 each (4 mg total) by mouth as needed for smoking cessation.  Indication: Nicotine  Addiction   traZODone  100 MG tablet Commonly known as: DESYREL  Take 1 tablet (100 mg total) by mouth at bedtime.  Indication: Trouble Sleeping   Ventolin  HFA 108 (90 Base) MCG/ACT inhaler Generic drug: albuterol  INHALE 2 PUFFS INTO THE LUNGS EVERY 6 HOURS AS NEEDED FOR WHEEZING OR SHORTNESS OF BREATH  Indication: Asthma, Spasm of Lung Air  Passages        Follow-up Information     [facility]. Go to.   Why: In person appointment  made for 12/26/22 at 11 AM. Contact information: [address] [place] [address] [phone]                 Follow-up recommendations:  Activity:  as tolerated Diet:  heart healthy  Comments:   Wellbutrin  (Bupropion ) 75 mg daily: ADHD symptoms, monitor for mood stabilization and side effects. Trazodone  100 mg nightly: Prescribe for insomnia, ensuring patient education on potential side effects (e.g., daytime drowsiness, dry mouth). Flagyl  (Metronidazole ) 500 mg BID for 3 days: Address suspected bacterial infection, educate patient on completing the full course and avoiding alcohol use during treatment. Lamotrigine  100 mg daily: Prescribed for mood stabilization, monitor for side effects such as rash (Stevens-Johnson Syndrome risk) and ensure therapeutic compliance. Cipro  (Ciprofloxacin ) 500 mg BID for 3 days: Prescribe as part of infection management, monitor for any side effects such as gastrointestinal discomfort or potential tendon issues. Nicorette  Gum (as needed): Support smoking cessation efforts, provide guidance on correct use to reduce cravings effectively. Refer to outpatient psychiatry within 7 days for medication management and further assessment National Suicide Prevention Lifeline: 1-800-273-TALK (831) 751-9137) Crisis Text Line: Text HOME to [phone]  Signed: Brad [name] Moats, NP 12/16/2023, 9:49 AM

## 2023-12-16 NOTE — Progress Notes (Signed)
   12/16/23 1000  Psych Admission Type (Psych Patients Only)  Admission Status Voluntary  Psychosocial Assessment  Patient Complaints Anxiety  Eye Contact Brief  Facial Expression Anxious  Affect Anxious;Blunted  Speech Logical/coherent  Interaction Assertive;Attention-seeking;Demanding;Needy  Motor Activity Pacing  Appearance/Hygiene Unremarkable  Behavior Characteristics Cooperative  Mood Pleasant  Thought Process  Coherency WDL  Content Blaming others  Delusions None reported or observed  Perception WDL  Hallucination None reported or observed  Judgment Impaired  Confusion Mild  Danger to Self  Current suicidal ideation? Denies  Agreement Not to Harm Self Yes  Description of Agreement verbal  Danger to Others  Danger to Others None reported or observed   Patient discharged at this time to home. All discharge instructions given to patient with acknowledgment with all belongings.

## 2023-12-16 NOTE — [facility] Suicide Risk Assessment (Signed)
 [facility] Admission Suicide Risk Assessment   Nursing information obtained from:  Patient Demographic factors:  Caucasian, Low socioeconomic status Current Mental Status:  NA Loss Factors:  NA Historical Factors:  NA Risk Reduction Factors:  Responsible for children under 38 years of age, Sense of responsibility to family, Employed, Living with another person, especially a relative  Total Time spent with patient: 2 hours Principal Problem: Bipolar disorder (HCC) Diagnosis:  Principal Problem:   Bipolar disorder (HCC) Active Problems:   Cannabis abuse   Overdose of venlafaxine    Benzodiazepine (tranquilizer) overdose  Subjective Data:  38 year old Caucasian female with a history of Borderline Personality Disorder (BPD), Major Depressive Disorder, ADHD, cannabis abuse, heroin abuse, cocaine abuse, and suicidal behavior. brought in by EMS and police for yelling, screaming, and suspected overdose on Venlafaxine  and Ativan , per the mother. Patient states, "I don't remember what happened last night." Current presentation includes suicidal behavior consistent with undertreated depression.Patient presented to the ED under involuntary commitment (IVC) after being brought in by EMS and police due to suspected overdose. The patient's mother reported concerns about the patient ingesting an entire bottle of medication.Reports chronic back pain interfering with sleep and memory difficulties, recalling the wrong year (2005) but correctly recalling address, date of birth, and number of children. Denies recent life changes, substance use (except for THC gummy for sleep), or current suicidal ideation (SI).Mother suspects the patient ingested a large quantity of medication; EMS found two full bottles of   Continued Clinical Symptoms:  Alcohol Use Disorder Identification Test Final Score (AUDIT): 0 The Alcohol Use Disorders Identification Test, Guidelines for Use in Primary Care, Second Edition.  [employer] Environmental Consultant [facility]). Score between 0-7:  no or low risk or alcohol related problems. Score between 8-15:  moderate risk of alcohol related problems. Score between 16-19:  high risk of alcohol related problems. Score 20 or above:  warrants further diagnostic evaluation for alcohol dependence and treatment.   CLINICAL FACTORS:   Bipolar Disorder:   Depressive phase Depression:   Comorbid alcohol abuse/dependence Impulsivity Insomnia Alcohol/Substance Abuse/Dependencies Personality Disorders:   Cluster B Chronic Pain   Musculoskeletal: Strength & Muscle Tone: within normal limits Gait & Station: normal Patient leans: N/A  Psychiatric Specialty Exam:  Presentation  General Appearance:  Appropriate for Environment; Neat  Eye Contact: Good  Speech: Clear and Coherent; Normal Rate  Speech Volume: Normal  Handedness: Right   Mood and Affect  Mood: Euthymic  Affect: Flat; Congruent   Thought Process  Thought Processes: Coherent  Descriptions of Associations:Intact  Orientation:Full (Time, Place and Person) (and situation)  Thought Content:WDL  History of Schizophrenia/Schizoaffective disorder:No  Duration of Psychotic Symptoms: none recorded Hallucinations:Hallucinations: None  Ideas of Reference:None  Suicidal Thoughts:Suicidal Thoughts: No  Homicidal Thoughts:Homicidal Thoughts: No   Sensorium  Memory: Remote Fair; Immediate Good  Judgment: Fair  Insight: Fair   Art Therapist  Concentration: Fair  Attention Span: Fair  Recall: Good  Fund of Knowledge: Good  Language: Good   Psychomotor Activity  Psychomotor Activity: Psychomotor Activity: Normal   Assets  Assets: Housing; Manufacturing Systems Engineer; Social Support; Health And Safety Inspector   Sleep  Sleep: Sleep: Good Number of Hours of Sleep: 7    Physical Exam: Physical Exam Vitals and nursing note reviewed.  Constitutional:      Appearance: Normal  appearance.  HENT:     Head: Normocephalic and atraumatic.     Nose: Nose normal.  Pulmonary:     Effort: Pulmonary effort is normal.  Musculoskeletal:  General: Normal range of motion.     Cervical back: Normal range of motion.  Neurological:     General: No focal deficit present.     Mental Status: She is alert and oriented to person, place, and time. Mental status is at baseline.  Psychiatric:        Attention and Perception: Attention and perception normal.        Mood and Affect: Mood and affect normal.        Speech: Speech normal.        Behavior: Behavior normal. Behavior is cooperative.        Thought Content: Thought content normal.        Cognition and Memory: Cognition and memory normal.        Judgment: Judgment normal.    Review of Systems  All other systems reviewed and are negative.  Blood pressure (!) 92/58, pulse 70, temperature 97.6 F (36.4 C), temperature source Oral, resp. rate 20, height 5' 4 (1.626 m), weight 93.4 kg, SpO2 93%. Body mass index is 35.36 kg/m.   COGNITIVE FEATURES THAT CONTRIBUTE TO RISK:  None    SUICIDE RISK:   Minimal: No identifiable suicidal ideation.  Patients presenting with no risk factors but with morbid ruminations; may be classified as minimal risk based on the severity of the depressive symptoms  PLAN OF CARE:  Wellbutrin  (Bupropion ) 75 mg daily: ADHD symptoms, monitor for mood stabilization and side effects. Trazodone  100 mg nightly: Prescribe for insomnia, ensuring patient education on potential side effects (e.g., daytime drowsiness, dry mouth). Flagyl  (Metronidazole ) 500 mg BID for 3 days: Address suspected bacterial infection, educate patient on completing the full course and avoiding alcohol use during treatment. Lamotrigine  100 mg daily: Prescribed for mood stabilization, monitor for side effects such as rash (Stevens-Johnson Syndrome risk) and ensure therapeutic compliance. Cipro  (Ciprofloxacin ) 500 mg BID for  3 days: Prescribe as part of infection management, monitor for any side effects such as gastrointestinal discomfort or potential tendon issues. Nicorette  Gum (as needed): Support smoking cessation efforts, provide guidance on correct use to reduce cravings effectively. Refer to outpatient psychiatry within 7 days for medication management and further assessment National Suicide Prevention Lifeline: 1-800-273-TALK (661)244-3052) Crisis Text Line: Text HOME to 636-616-6970 I certify that inpatient services furnished can reasonably be expected to improve the patient's condition.   Brad GORMAN Moats, NP 12/16/2023, 9:34 AM

## 2023-12-16 NOTE — Plan of Care (Signed)
  Problem: Education: Goal: Emotional status will improve Outcome: Progressing   Problem: Education: Goal: Mental status will improve Outcome: Progressing   Problem: Education: Goal: Verbalization of understanding the information provided will improve Outcome: Progressing   

## 2023-12-16 NOTE — Group Note (Signed)
 Date:  12/16/2023 Time:  10:10 AM  Group Topic/Focus:  Goals Group:   The focus of this group is to help patients establish daily goals to achieve during treatment and discuss how the patient can incorporate goal setting into their daily lives to aide in recovery.    Participation Level:  Active  Participation Quality:  Appropriate  Affect:  Appropriate  Cognitive:  Appropriate  Insight: Appropriate  Engagement in Group:  Engaged  Modes of Intervention:  Discussion, Education, and Support  Additional Comments:    Deitra Caron Mainland 12/16/2023, 10:10 AM

## 2023-12-16 NOTE — Progress Notes (Signed)
 Nursing Shift Note:  1900-0700  Attended Evening Group: yes Medication Compliant:  yes Behavior: anxious; cooperative Sleep Quality: fair Significant Changes: Patient is focused on leaving today to go back to work.  Pt was advised that provider will assess and address on arrival to the unit.  Pt denies SI    12/16/23 0500  Psych Admission Type (Psych Patients Only)  Admission Status Involuntary  Psychosocial Assessment  Patient Complaints Anxiety  Eye Contact Fair  Facial Expression Anxious  Affect Anxious  Speech Logical/coherent  Interaction Assertive;Attention-seeking  Motor Activity Slow  Appearance/Hygiene Unremarkable  Behavior Characteristics Cooperative;Anxious  Mood Anxious  Thought Process  Coherency WDL  Content WDL  Delusions None reported or observed  Perception WDL  Hallucination None reported or observed  Judgment WDL  Confusion WDL  Danger to Self  Current suicidal ideation? Denies  Danger to Others  Danger to Others None reported or observed

## 2024-01-22 ENCOUNTER — Ambulatory Visit
Admission: EM | Admit: 2024-01-22 | Discharge: 2024-01-22 | Disposition: A | Discharge status: Home / Self Care | Payer: MEDICAID | Attending: Physician Assistant | Admitting: Physician Assistant

## 2024-01-22 DIAGNOSIS — R110 Nausea: Secondary | ICD-10-CM | POA: Diagnosis not present

## 2024-01-22 DIAGNOSIS — R197 Diarrhea, unspecified: Secondary | ICD-10-CM | POA: Diagnosis not present

## 2024-01-22 DIAGNOSIS — A084 Viral intestinal infection, unspecified: Secondary | ICD-10-CM

## 2024-01-22 MED ORDER — ONDANSETRON 4 MG PO TBDP: 4.0000 mg | ORAL_TABLET | Freq: Three times a day (TID) | ORAL | 0 refills | Status: AC | PRN

## 2024-01-22 NOTE — ED Triage Notes (Signed)
 Pt c/o abdominal pain, diarrhea, fatigue x4days  Pt states that she has abdominal pain when eating.   Pt last full meal was on Wednesday.   Pt denies any new food, products, medications that could cause diarrhea.   Pt asks for a work note

## 2024-01-22 NOTE — ED Provider Notes (Signed)
 [facility]    CSN: [id number] Arrival date & time: 01/22/24  4098      History   Chief Complaint Chief Complaint  Patient presents with   Abdominal Pain    HPI [name] is a 38 y.o. female presenting for 3-day history of abdominal cramping, diarrhea, fatigue and nausea.  Denies fever.  Reports loss of appetite.  Denies cough, congestion, sore throat, chest pain, shortness of breath, vomiting, urinary symptoms.  Her children are sick with similar symptoms.  She has not taken any over-the-counter medicine.  She missed work yesterday and states she does not feel well enough to go in today and would like a work note.  Symptoms have improved from onset.  No other complaints.  HPI  Past Medical History:  Diagnosis Date   Borderline personality disorder (HCC) 04/16/2013   Cannabis abuse 2014   Chondromalacia patellae, right knee    Depression    Well-controlled with Zoloft   Gestational diabetes    Has prediabetes now.   Headache    Heroin abuse (HCC)    History of drug use 12/15/2017   IDA (iron deficiency anemia)    Migraines    Morbid obesity with BMI of 40.0-44.9, adult (HCC)    Nicotine dependence with current use 06/26/2021   Osteoarthritis, knee     Patient Active Problem List   Diagnosis Date Noted   Bipolar disorder (HCC) 12/16/2023   Accidental overdose 12/15/2023   Benzodiazepine (tranquilizer) overdose 12/14/2023   Back pain 12/13/2023   Overdose of venlafaxine 12/13/2023   Chronic obstructive pulmonary disease (HCC) 02/09/2022   Microcytic anemia 02/02/2022   Depression    Acute cough 12/03/2021   Leg edema 09/30/2021   Dizzinesses 09/11/2021   Palpitations 09/11/2021   Nicotine dependence with current use 06/26/2021   Morbid obesity (HCC) 06/26/2021   Anemia, mild 03/28/2018   History of drug use 12/14/2017   Symptomatic cholelithiasis    Painful orthopaedic hardware (HCC) 07/23/2015   Borderline personality disorder (HCC)  04/16/2013   Cannabis abuse 04/16/2013   Cocaine abuse (HCC) 04/16/2013   Suicidal behavior 04/16/2013    Past Surgical History:  Procedure Laterality Date   ANKLE SURGERY Left 12/07/2010   CESAREAN SECTION  12/07/2009   CESAREAN SECTION WITH BILATERAL TUBAL LIGATION Bilateral 06/15/2018   Procedure: CESAREAN SECTION WITH BILATERAL TUBAL LIGATION;  Surgeon: Suzy Bouchard, MD;  Location: [facility] ORS;  Service: Obstetrics;  Laterality: Bilateral;   CHOLECYSTECTOMY N/A 09/27/2016   Procedure: LAPAROSCOPIC CHOLECYSTECTOMY WITH INTRAOPERATIVE CHOLANGIOGRAM;  Surgeon: Lattie Haw, MD;  Location: [facility] OR[facility];  Service: General;  Laterality: N/A;   DILATION AND CURETTAGE OF UTERUS  12/08/2011   KNEE SURGERY Right 08/05/2022   TONSILLECTOMY  12/07/2005    OB History     Gravida  4   Para  2   Term  1   Preterm      AB  2   Living  1      SAB  1   IAB      Ectopic  1   Multiple  0   Live Births  1        Obstetric Comments  1st Menstrual Cycle:  12 1st Pregnancy:  21          Home Medications    Prior to Admission medications   Medication Sig Start Date End Date Taking? Authorizing Provider  albuterol (VENTOLIN HFA) 108 (90 Base) MCG/ACT inhaler INHALE 2 PUFFS INTO  THE LUNGS EVERY 6 HOURS AS NEEDED FOR WHEEZING OR SHORTNESS OF BREATH 11/15/23  Yes Larae Grooms, NP  baclofen (LIORESAL) 20 MG tablet Take 1 tablet (20 mg total) by mouth 3 (three) times daily. 11/15/23  Yes Larae Grooms, NP  ondansetron (ZOFRAN-ODT) 4 MG disintegrating tablet Take 1 tablet (4 mg total) by mouth every 8 (eight) hours as needed. 01/22/24  Yes Shirlee Latch, PA-C  buPROPion (WELLBUTRIN) 75 MG tablet Take 1 tablet (75 mg total) by mouth daily. 12/17/23 01/16/24  Myriam Forehand, NP  lamoTRIgine (LAMICTAL) 100 MG tablet Take 1 tablet (100 mg total) by mouth daily. 12/17/23 01/16/24  Myriam Forehand, NP  traZODone (DESYREL) 100 MG tablet Take 1 tablet (100 mg total) by mouth at  bedtime. 12/16/23 01/15/24  Myriam Forehand, NP    Family History Family History  Problem Relation Age of Onset   Arthritis Mother    Cancer Mother        skin   Hyperlipidemia Mother        takes meds   Coronary artery disease Mother        non-obstructive   Cancer Father        Lung   Anxiety disorder Sister    Depression Sister    ADD / ADHD Son    Heart attack Maternal Grandfather 32   Coronary artery disease Maternal Grandfather 77       s/p CABG    Social History Social History   Tobacco Use   Smoking status: Former    Current packs/day: 1.00    Average packs/day: 1 pack/day for 13.0 years (13.0 ttl pk-yrs)    Types: Cigarettes   Smokeless tobacco: Never  Vaping Use   Vaping status: Every Day  Substance Use Topics   Alcohol use: Yes   Drug use: Not Currently    Comment: history of heroin and marijuana use     Allergies   Clindamycin/lincomycin, Gabapentin, and Amoxicillin   Review of Systems Review of Systems  Constitutional:  Positive for appetite change and fatigue. Negative for chills, diaphoresis and fever.  HENT:  Negative for congestion, ear pain, rhinorrhea and sore throat.   Respiratory:  Negative for cough and shortness of breath.   Cardiovascular:  Negative for chest pain.  Gastrointestinal:  Positive for abdominal pain, diarrhea and nausea. Negative for vomiting.  Genitourinary:  Negative for difficulty urinating, dysuria, flank pain and frequency.  Musculoskeletal:  Negative for arthralgias and myalgias.  Skin:  Negative for rash.  Neurological:  Negative for weakness and headaches.  Hematological:  Negative for adenopathy.     Physical Exam Triage Vital Signs ED Triage Vitals  Encounter Vitals Group     BP 01/22/24 1014 110/81     Systolic BP Percentile --      Diastolic BP Percentile --      Pulse Rate 01/22/24 1014 93     Resp 01/22/24 1014 16     Temp 01/22/24 1014 98.2 F (36.8 C)     Temp Source 01/22/24 1014 Oral     SpO2  01/22/24 1014 97 %     Weight 01/22/24 1011 200 lb (90.7 kg)     Height 01/22/24 1011 5\' 4"  (1.626 m)     Head Circumference --      Peak Flow --      Pain Score 01/22/24 1011 10     Pain Loc --      Pain Education --  Exclude from Growth Chart --    No data found.  Updated Vital Signs BP 110/81 (BP Location: Left Arm)   Pulse 93   Temp 98.2 F (36.8 C) (Oral)   Resp 16   Ht 5\' 4"  (1.626 m)   Wt 200 lb (90.7 kg)   LMP  (LMP Unknown)   SpO2 97%   BMI 34.33 kg/m      Physical Exam Vitals and nursing note reviewed.  Constitutional:      General: She is not in acute distress.    Appearance: Normal appearance. She is not ill-appearing or toxic-appearing.  HENT:     Head: Normocephalic and atraumatic.     Nose: Nose normal.     Mouth/Throat:     Mouth: Mucous membranes are moist.     Pharynx: Oropharynx is clear.  Eyes:     General: No scleral icterus.       Right eye: No discharge.        Left eye: No discharge.     Conjunctiva/sclera: Conjunctivae normal.  Cardiovascular:     Rate and Rhythm: Normal rate and regular rhythm.     Heart sounds: Normal heart sounds.  Pulmonary:     Effort: Pulmonary effort is normal. No respiratory distress.     Breath sounds: Normal breath sounds.  Abdominal:     Palpations: Abdomen is soft.     Tenderness: There is abdominal tenderness (generalized).  Musculoskeletal:     Cervical back: Neck supple.  Skin:    General: Skin is dry.  Neurological:     General: No focal deficit present.     Mental Status: She is alert. Mental status is at baseline.     Motor: No weakness.     Gait: Gait normal.  Psychiatric:        Mood and Affect: Mood normal.        Behavior: Behavior normal.      UC Treatments / Results  Labs (all labs ordered are listed, but only abnormal results are displayed) Labs Reviewed - No data to display  EKG   Radiology No results found.  Procedures Procedures (including critical care  time)  Medications Ordered in UC Medications - No data to display  Initial Impression / Assessment and Plan / UC Course  I have reviewed the triage vital signs and the nursing notes.  Pertinent labs & imaging results that were available during my care of the patient were reviewed by me and considered in my medical decision making (see chart for details).   38 year old female presents for generalized abdominal cramping, diarrhea and nausea x 3 days.  No fever or vomiting.  Multiple family members have similar symptoms.  She reports feeling better than at onset but still not feeling well enough to work today.  Not currently having any abdominal discomfort.  Vitals normal and stable.  She is overall well-appearing.  On exam has normal HEENT findings.  Abdomen soft with generalized tenderness.  No guarding or rebound.  Chest clear.  Heart regular rate and rhythm.  Presentation consistent with viral gastroenteritis.  Reported care encouraged with increased rest and fluids.  Sent Zofran to pharmacy.  Advised bland diet.  Advised pneumonia if needed.  She says diarrhea has stopped today.  Thoroughly reviewed return ED precautions.  Work note given for today.   Final Clinical Impressions(s) / UC Diagnoses   Final diagnoses:  Viral gastroenteritis  Diarrhea, unspecified type  Nausea     Discharge Instructions  ABDOMINAL PAIN: You may take Tylenol for pain relief. Use medications as directed including antiemetics and antidiarrheal medications if suggested or prescribed. You should increase fluids and electrolytes as well as rest over these next several days. If you have any questions or concerns, or if your symptoms are not improving or if especially if they acutely worsen, please call or stop back to the clinic immediately and we will be happy to help you or go to the ER   ABDOMINAL PAIN RED FLAGS: Seek immediate further care if: symptoms remain the same or worsen over the next 3-7 days,  you are unable to keep fluids down, you see blood or mucus in your stool, you vomit black or dark red material, you have a fever of 101.F or higher, you have localized and/or persistent abdominal pain       ED Prescriptions     Medication Sig Dispense Auth. Provider   ondansetron (ZOFRAN-ODT) 4 MG disintegrating tablet Take 1 tablet (4 mg total) by mouth every 8 (eight) hours as needed. 15 tablet Gareth Morgan      PDMP not reviewed this encounter.   Shirlee Latch, PA-C 01/22/24 1057

## 2024-01-22 NOTE — Discharge Instructions (Addendum)
 ABDOMINAL PAIN: You may take Tylenol for pain relief. Use medications as directed including antiemetics and antidiarrheal medications if suggested or prescribed. You should increase fluids and electrolytes as well as rest over these next several days. If you have any questions or concerns, or if your symptoms are not improving or if especially if they acutely worsen, please call or stop back to the clinic immediately and we will be happy to help you or go to the ER   ABDOMINAL PAIN RED FLAGS: Seek immediate further care if: symptoms remain the same or worsen over the next 3-7 days, you are unable to keep fluids down, you see blood or mucus in your stool, you vomit black or dark red material, you have a fever of 101.F or higher, you have localized and/or persistent abdominal pain

## 2024-02-11 ENCOUNTER — Other Ambulatory Visit: Payer: Self-pay | Admitting: Nurse Practitioner

## 2024-02-11 NOTE — Telephone Encounter (Signed)
 Copied from CRM [id number]. Topic: Clinical - Medication Refill >> Feb 11, 2024  1:26 PM Ivette P wrote: Most Recent Primary Care Visit:  Provider: Larae Grooms  Department: [id number]  Visit Type: OFFICE VISIT  Date: 11/15/2023  Medication: baclofen (LIORESAL) 20 MG tablet  Has the patient contacted their pharmacy? Yes (Agent: If no, request that the patient contact the pharmacy for the refill. If patient does not wish to contact the pharmacy document the reason why and proceed with request.) (Agent: If yes, when and what did the pharmacy advise?)  Is this the correct pharmacy for this prescription? Yes If no, delete pharmacy and type the correct one.  This is the patient's preferred pharmacy:  [id number] - [place], [place] - [address] AT [facility] OF [address] [place] [address] Phone: [phone] Fax: [phone]   Has the prescription been filled recently? No  Is the patient out of the medication? Yes  Has the patient been seen for an appointment in the last year OR does the patient have an upcoming appointment? Yes  Can we respond through MyChart? Yes  Agent: Please be advised that Rx refills may take up to 3 business days. We ask that you follow-up with your pharmacy.

## 2024-02-14 ENCOUNTER — Ambulatory Visit: Payer: Self-pay | Admitting: Nurse Practitioner

## 2024-02-14 NOTE — Telephone Encounter (Signed)
 Has appt tomorrow  Requested Prescriptions  Pending Prescriptions Disp Refills   baclofen (LIORESAL) 20 MG tablet [Pharmacy Med Name: BACLOFEN 20MG  TABLETS] 90 tablet 0    Sig: TAKE 1 TABLET(20 MG) BY MOUTH THREE TIMES DAILY     Analgesics:  Muscle Relaxants - baclofen Passed - 02/14/2024  9:37 AM      Passed - Cr in normal range and within 180 days    Creatinine  Date Value Ref Range Status  04/28/2014 0.79 0.60 - 1.30 mg/dL Final   Creatinine, Ser  Date Value Ref Range Status  12/12/2023 0.72 0.44 - 1.00 mg/dL Final         Passed - eGFR is 30 or above and within 180 days    EGFR (African American)  Date Value Ref Range Status  04/28/2014 >60  Final   GFR calc Af Amer  Date Value Ref Range Status  11/22/2018 >60 >60 mL/min Final   EGFR (Non-African Amer.)  Date Value Ref Range Status  04/28/2014 >60  Final    Comment:    eGFR values <25mL/min/1.73 m2 may be an indication of chronic kidney disease (CKD). Calculated eGFR is useful in patients with stable renal function. The eGFR calculation will not be reliable in acutely ill patients when serum creatinine is changing rapidly. It is not useful in  patients on dialysis. The eGFR calculation may not be applicable to patients at the low and high extremes of body sizes, pregnant women, and vegetarians.    GFR, Estimated  Date Value Ref Range Status  12/12/2023 >60 >60 mL/min Final    Comment:    (NOTE) Calculated using the CKD-EPI Creatinine Equation (2021)    eGFR  Date Value Ref Range Status  09/03/2021 116 >59 mL/min/1.73 Final         Passed - Valid encounter within last 6 months    Recent Outpatient Visits           3 months ago Borderline personality disorder [facility])   [facility] Larae Grooms, NP   3 months ago Acquired hypothyroidism   [facility] Larae Grooms, NP   4 months ago Borderline personality disorder [facility])   [place] [facility] Larae Grooms, NP   9 months ago Attention deficit hyperactivity disorder (ADHD), unspecified ADHD type   [facility] Larae Grooms, NP   1 year ago Need for influenza vaccination   [facility] Larae Grooms, NP       Future Appointments             Tomorrow Larae Grooms, NP  [facility]

## 2024-02-15 ENCOUNTER — Ambulatory Visit (INDEPENDENT_AMBULATORY_CARE_PROVIDER_SITE_OTHER): Payer: MEDICAID | Admitting: Nurse Practitioner

## 2024-02-15 ENCOUNTER — Encounter: Payer: Self-pay | Admitting: Nurse Practitioner

## 2024-02-15 VITALS — BP 133/86 | HR 64 | Ht 64.0 in | Wt 193.6 lb

## 2024-02-15 DIAGNOSIS — G8929 Other chronic pain: Secondary | ICD-10-CM

## 2024-02-15 DIAGNOSIS — M545 Low back pain, unspecified: Secondary | ICD-10-CM | POA: Diagnosis not present

## 2024-02-15 DIAGNOSIS — F603 Borderline personality disorder: Secondary | ICD-10-CM

## 2024-02-15 MED ORDER — LORAZEPAM 1 MG PO TABS: 1.0000 mg | ORAL_TABLET | Freq: Three times a day (TID) | ORAL | Status: DC

## 2024-02-15 MED ORDER — BACLOFEN 20 MG PO TABS: 20.0000 mg | ORAL_TABLET | Freq: Three times a day (TID) | ORAL | 5 refills | Status: DC

## 2024-02-15 MED ORDER — BUPROPION HCL ER (XL) 150 MG PO TB24: 150.0000 mg | ORAL_TABLET | Freq: Every day | ORAL | Status: DC

## 2024-02-15 MED ORDER — VENLAFAXINE HCL ER 150 MG PO CP24: 150.0000 mg | ORAL_CAPSULE | Freq: Every day | ORAL | Status: DC

## 2024-02-15 NOTE — Assessment & Plan Note (Signed)
 Chronic.  Ongoing concern.  Followed by [facility].  On Wellbutrin, Effexor, and Lamictal.  Also using Trazodone at bedtime.  Continue to follow up with specialist.

## 2024-02-15 NOTE — Assessment & Plan Note (Signed)
 Chronic. Ongoing concern.  Saw [facility].  Does not want to do Injections or Surgery at this time.  Continue with Baclofen.  1 month supply given with refills.  Follow up in 6 months.

## 2024-02-15 NOTE — Progress Notes (Signed)
 BP 133/86 (BP Location: Left Wrist, Patient Position: Sitting, Cuff Size: Small)   Pulse 64   Ht 5\' 4"  (1.626 m)   Wt 193 lb 9.6 oz (87.8 kg)   LMP  (LMP Unknown)   SpO2 97%   BMI 33.23 kg/m    Subjective:    Patient ID: [name], female    DOB: 1986-06-21, 38 y.o.   MRN: [id number]  HPI: [name] is a 38 y.o. female  Chief Complaint  Patient presents with   Depression   Anxiety   Back Pain   Medication Refill    Baclofen    Patient has complicated psychiatric history.  Recently IVC'd for possible overdose in January.  Since discharge is back seeing [facility].  Now on Wellbutrin 150mg , Effexor 150mg , and Lamicatal 100mg .  She is also using Trazodone at bedtime.  States she feels like this regimen is working well for her.  Denies SI.  Patient states she has been having back pain for several years.  Patient was previously on baclofen 20mg  TID from another provider.  Patient states it is low back pain.  Patient went to see [facility] and was given the option for shots or surgery.  At this time, she prefers not to do either.    Relevant past medical, surgical, family and social history reviewed and updated as indicated. Interim medical history since our last visit reviewed. Allergies and medications reviewed and updated.  Review of Systems  Musculoskeletal:  Positive for back pain.  Psychiatric/Behavioral:  Positive for dysphoric mood. The patient is nervous/anxious.     Per HPI unless specifically indicated above     Objective:    BP 133/86 (BP Location: Left Wrist, Patient Position: Sitting, Cuff Size: Small)   Pulse 64   Ht 5\' 4"  (1.626 m)   Wt 193 lb 9.6 oz (87.8 kg)   LMP  (LMP Unknown)   SpO2 97%   BMI 33.23 kg/m   Wt Readings from Last 3 Encounters:  02/15/24 193 lb 9.6 oz (87.8 kg)  01/22/24 200 lb (90.7 kg)  12/13/23 206 lb (93.4 kg)    Physical Exam Vitals and nursing note reviewed.  Constitutional:      General: She is not in acute  distress.    Appearance: Normal appearance. She is normal weight. She is not ill-appearing, toxic-appearing or diaphoretic.  HENT:     Head: Normocephalic.     Right Ear: External ear normal.     Left Ear: External ear normal.     Nose: Nose normal.     Mouth/Throat:     Mouth: Mucous membranes are moist.     Pharynx: Oropharynx is clear.  Eyes:     General:        Right eye: No discharge.        Left eye: No discharge.     Extraocular Movements: Extraocular movements intact.     Conjunctiva/sclera: Conjunctivae normal.     Pupils: Pupils are equal, round, and reactive to light.  Cardiovascular:     Rate and Rhythm: Normal rate and regular rhythm.     Heart sounds: No murmur heard. Pulmonary:     Effort: Pulmonary effort is normal. No respiratory distress.     Breath sounds: Normal breath sounds. No wheezing or rales.  Musculoskeletal:     Cervical back: Normal range of motion and neck supple.  Skin:    General: Skin is warm and dry.     Capillary  Refill: Capillary refill takes less than 2 seconds.  Neurological:     General: No focal deficit present.     Mental Status: She is alert and oriented to person, place, and time. Mental status is at baseline.  Psychiatric:        Mood and Affect: Mood normal.        Behavior: Behavior normal.        Thought Content: Thought content normal.        Judgment: Judgment normal.     Results for orders placed or performed during the hospital encounter of 12/13/23  Urinalysis, Complete w Microscopic -Urine, Clean Catch   Collection Time: 12/14/23  3:05 PM  Result Value Ref Range   Color, Urine YELLOW (A) YELLOW   APPearance CLOUDY (A) CLEAR   Specific Gravity, Urine 1.011 1.005 - 1.030   pH 6.0 5.0 - 8.0   Glucose, UA NEGATIVE NEGATIVE mg/dL   Hgb urine dipstick SMALL (A) NEGATIVE   Bilirubin Urine NEGATIVE NEGATIVE   Ketones, ur NEGATIVE NEGATIVE mg/dL   Protein, ur NEGATIVE NEGATIVE mg/dL   Nitrite NEGATIVE NEGATIVE    Leukocytes,Ua LARGE (A) NEGATIVE   RBC / HPF 21-50 0 - 5 RBC/hpf   WBC, UA >50 0 - 5 WBC/hpf   Bacteria, UA RARE (A) NONE SEEN   Squamous Epithelial / HPF 6-10 0 - 5 /HPF   Mucus PRESENT       Assessment & Plan:   Problem List Items Addressed This Visit       Other   Borderline personality disorder (HCC) - Primary    Chronic.  Patient has complicated psychiatric history including substance abuse.  Has been seen at [facility] the past and currently being seen at [facility] in [facility].  On Lamictal, Effexor and Lorazepam.  Anxiety is not well controlled likely due to Lorazepam being short acting.  Will refer patient to new psychiatrist for management of medications.  Follow up in 1 month.       Relevant Orders   Ambulatory referral to Psychiatry   Other Visit Diagnoses     Chronic left-sided low back pain without sciatica       Chronic condition. Previously on Baclofen with Sublucaid. Came off medication while in jail. No longer seeing provider for Sublocaid but needs refill of Baclofen.  Will obtain xray of lumbar spine and refill Baclofen today. May need referral for ortho in the future.   Relevant Medications   lamoTRIgine (LAMICTAL) 150 MG tablet   venlafaxine (EFFEXOR) 100 MG tablet   venlafaxine (EFFEXOR) 75 MG tablet   baclofen (LIORESAL) 20 MG tablet   Other Relevant Orders   DG Lumbar Spine Complete        Follow up plan: Return in about 6 months (around 08/17/2024) for HTN, HLD, DM2 FU.

## 2024-03-14 ENCOUNTER — Telehealth: Payer: Self-pay

## 2024-03-14 NOTE — Telephone Encounter (Signed)
 Copied from CRM [id number]. Topic: Clinical - Medication Question >> Mar 14, 2024  3:42 PM Patsy Lager T wrote: Reason for CRM: patient called to get info on the weight loss injections and whether medicaid will cover the injections. Please f/u with patient.

## 2024-03-16 ENCOUNTER — Observation Stay
Admission: EM | Admit: 2024-03-16 | Discharge: 2024-03-18 | Disposition: A | Discharge status: Home / Self Care | Payer: MEDICAID | Attending: Internal Medicine | Admitting: Internal Medicine

## 2024-03-16 ENCOUNTER — Emergency Department: Payer: MEDICAID

## 2024-03-16 DIAGNOSIS — Z87891 Personal history of nicotine dependence: Secondary | ICD-10-CM | POA: Diagnosis not present

## 2024-03-16 DIAGNOSIS — R4589 Other symptoms and signs involving emotional state: Secondary | ICD-10-CM | POA: Diagnosis present

## 2024-03-16 DIAGNOSIS — Z79899 Other long term (current) drug therapy: Secondary | ICD-10-CM | POA: Diagnosis not present

## 2024-03-16 DIAGNOSIS — Z9151 Personal history of suicidal behavior: Secondary | ICD-10-CM

## 2024-03-16 DIAGNOSIS — J449 Chronic obstructive pulmonary disease, unspecified: Secondary | ICD-10-CM | POA: Diagnosis present

## 2024-03-16 DIAGNOSIS — R4189 Other symptoms and signs involving cognitive functions and awareness: Principal | ICD-10-CM

## 2024-03-16 DIAGNOSIS — F1090 Alcohol use, unspecified, uncomplicated: Secondary | ICD-10-CM | POA: Insufficient documentation

## 2024-03-16 DIAGNOSIS — R45851 Suicidal ideations: Secondary | ICD-10-CM | POA: Diagnosis not present

## 2024-03-16 DIAGNOSIS — G928 Other toxic encephalopathy: Secondary | ICD-10-CM | POA: Diagnosis present

## 2024-03-16 DIAGNOSIS — F1910 Other psychoactive substance abuse, uncomplicated: Secondary | ICD-10-CM | POA: Diagnosis not present

## 2024-03-16 DIAGNOSIS — T50904A Poisoning by unspecified drugs, medicaments and biological substances, undetermined, initial encounter: Secondary | ICD-10-CM | POA: Diagnosis not present

## 2024-03-16 DIAGNOSIS — F1410 Cocaine abuse, uncomplicated: Secondary | ICD-10-CM | POA: Diagnosis present

## 2024-03-16 DIAGNOSIS — F319 Bipolar disorder, unspecified: Secondary | ICD-10-CM | POA: Diagnosis present

## 2024-03-16 DIAGNOSIS — G9340 Encephalopathy, unspecified: Secondary | ICD-10-CM | POA: Diagnosis present

## 2024-03-16 DIAGNOSIS — R4182 Altered mental status, unspecified: Secondary | ICD-10-CM | POA: Diagnosis present

## 2024-03-16 LAB — COMPREHENSIVE METABOLIC PANEL WITH GFR
ALT: 34 U/L (ref 0–44)
AST: 32 U/L (ref 15–41)
Albumin: 4.4 g/dL (ref 3.5–5.0)
Alkaline Phosphatase: 57 U/L (ref 38–126)
Anion gap: 10 (ref 5–15)
BUN: 11 mg/dL (ref 6–20)
CO2: 26 mmol/L (ref 22–32)
Calcium: 9.6 mg/dL (ref 8.9–10.3)
Chloride: 106 mmol/L (ref 98–111)
Creatinine, Ser: 0.91 mg/dL (ref 0.44–1.00)
GFR, Estimated: >60 mL/min (ref >60)
Glucose, Bld: 81 mg/dL (ref 70–99)
Potassium: 4.5 mmol/L (ref 3.5–5.1)
Sodium: 142 mmol/L (ref 135–145)
Total Bilirubin: 1.1 mg/dL (ref 0.0–1.2)
Total Protein: 7.3 g/dL (ref 6.5–8.1)

## 2024-03-16 LAB — CBC WITH DIFFERENTIAL/PLATELET
Abs Immature Granulocytes: 0.01 10*3/uL (ref 0.00–0.07)
Basophils Absolute: 0 10*3/uL (ref 0.0–0.1)
Basophils Relative: 1 %
Eosinophils Absolute: 0.1 10*3/uL (ref 0.0–0.5)
Eosinophils Relative: 1 %
HCT: 42.5 % (ref 36.0–46.0)
Hemoglobin: 14.6 g/dL (ref 12.0–15.0)
Immature Granulocytes: 0 %
Lymphocytes Relative: 37 %
Lymphs Abs: 2.4 10*3/uL (ref 0.7–4.0)
MCH: 29.5 pg (ref 26.0–34.0)
MCHC: 34.4 g/dL (ref 30.0–36.0)
MCV: 85.9 fL (ref 80.0–100.0)
Monocytes Absolute: 0.4 10*3/uL (ref 0.1–1.0)
Monocytes Relative: 6 %
Neutro Abs: 3.5 10*3/uL (ref 1.7–7.7)
Neutrophils Relative %: 55 %
Platelets: 207 10*3/uL (ref 150–400)
RBC: 4.95 MIL/uL (ref 3.87–5.11)
RDW: 12.7 % (ref 11.5–15.5)
WBC: 6.4 10*3/uL (ref 4.0–10.5)
nRBC: 0 % (ref 0.0–0.2)

## 2024-03-16 LAB — GLUCOSE, CAPILLARY
Glucose-Capillary: 44 mg/dL — CL (ref 70–99)
Glucose-Capillary: 46 mg/dL — ABNORMAL LOW (ref 70–99)
Glucose-Capillary: 49 mg/dL — ABNORMAL LOW (ref 70–99)
Glucose-Capillary: 103 mg/dL — ABNORMAL HIGH (ref 70–99)
Glucose-Capillary: 57 mg/dL — ABNORMAL LOW (ref 70–99)

## 2024-03-16 LAB — ACETAMINOPHEN LEVEL: Acetaminophen (Tylenol), Serum: <10 ug/mL — ABNORMAL LOW (ref 10–30)

## 2024-03-16 LAB — MAGNESIUM: Magnesium: 2.4 mg/dL (ref 1.7–2.4)

## 2024-03-16 LAB — LACTIC ACID, PLASMA: Lactic Acid, Venous: 1.2 mmol/L (ref 0.5–1.9)

## 2024-03-16 LAB — SALICYLATE LEVEL: Salicylate Lvl: <7 mg/dL — ABNORMAL LOW (ref 7.0–30.0)

## 2024-03-16 LAB — CK: Total CK: 80 U/L (ref 38–234)

## 2024-03-16 MED ORDER — ENOXAPARIN SODIUM 40 MG/0.4ML IJ SOSY
40.0000 mg | PREFILLED_SYRINGE | INTRAMUSCULAR | Status: DC
  Administered 2024-03-16 – 2024-03-17 (×2): 40 mg via SUBCUTANEOUS
  Filled 2024-03-16 (×2): qty 0.4

## 2024-03-16 MED ORDER — NALOXONE HCL 2 MG/2ML IJ SOSY: 0.4000 mg | PREFILLED_SYRINGE | INTRAMUSCULAR | Status: DC | PRN

## 2024-03-16 MED ORDER — ONDANSETRON HCL 4 MG PO TABS: 4.0000 mg | ORAL_TABLET | Freq: Four times a day (QID) | ORAL | Status: DC | PRN

## 2024-03-16 MED ORDER — SODIUM CHLORIDE 0.9 % IV BOLUS
1000.0000 mL | Freq: Once | INTRAVENOUS | Status: AC
  Administered 2024-03-16: 1000 mL via INTRAVENOUS

## 2024-03-16 MED ORDER — ACETAMINOPHEN 325 MG PO TABS
650.0000 mg | ORAL_TABLET | Freq: Four times a day (QID) | ORAL | Status: DC | PRN
  Administered 2024-03-17: 650 mg via ORAL
  Filled 2024-03-16: qty 2

## 2024-03-16 MED ORDER — DEXTROSE-SODIUM CHLORIDE 5-0.45 % IV SOLN: INTRAVENOUS | Status: AC

## 2024-03-16 MED ORDER — ONDANSETRON HCL 4 MG/2ML IJ SOLN: 4.0000 mg | Freq: Four times a day (QID) | INTRAMUSCULAR | Status: DC | PRN

## 2024-03-16 MED ORDER — SODIUM CHLORIDE 0.9 % IV SOLN: INTRAVENOUS | Status: DC

## 2024-03-16 MED ORDER — NALOXONE HCL 2 MG/2ML IJ SOSY
1.0000 mg | PREFILLED_SYRINGE | INTRAMUSCULAR | Status: AC
  Administered 2024-03-16: 1 mg via INTRAVENOUS
  Filled 2024-03-16: qty 2

## 2024-03-16 MED ORDER — DEXTROSE 50 % IV SOLN
25.0000 g | INTRAVENOUS | Status: AC
  Administered 2024-03-16: 25 g via INTRAVENOUS
  Filled 2024-03-16: qty 50

## 2024-03-16 NOTE — ED Triage Notes (Signed)
 Pt to ED via EMS with PD for altered mental status. Pt was found in car altered at [facility] by PD and was given 2 mg of Narcan on scene with little change. Pt became increasingly unresponsive at jail w/ stable VS.

## 2024-03-16 NOTE — Assessment & Plan Note (Signed)
 Noted prior history of suicidal attempts associated with drug overdose Suicide precautions Behavioral health consult Follow-up recommendations

## 2024-03-16 NOTE — ED Provider Notes (Signed)
 Nursing reports patient's alertness improved after naloxone.  Continuing to monitor.   Sharyn Creamer, MD 03/16/24 [phone]

## 2024-03-16 NOTE — ED Provider Notes (Signed)
 [facility] Provider Note    Event Date/Time   First MD Initiated Contact with Patient 03/16/24 [phone]     (approximate)   History   Altered Mental Status   HPI  [name] is a 38 year old female with history of polysubstance use including cannabis, cocaine, opiates, benzodiazepines presenting to the emergency department for evaluation of altered mental status.  PD was at the [place] getting food when they were notified that the patient was slumped over in her car.  She was found with drug paraphernalia and a white substance that was thought to possibly be cocaine and was arrested, somnolent but arousable at that time.  After arriving at jail she was found to be increasingly unresponsive leading them to present to the ER.  Patient did receive 2 mg of intranasal Narcan without change in clinical status.  I did review her discharge summary from 12/16/2023.  At that time, patient presented after a suspected overdose on a bottle of medication.  She was initially somnolent, became more arousable while in the ER, ultimately admitted for psychiatric evaluation.     Physical Exam   Triage Vital Signs: ED Triage Vitals [03/16/24 0535]  Encounter Vitals Group     BP (!) 135/95     Systolic BP Percentile      Diastolic BP Percentile      Pulse Rate 70     Resp      Temp      Temp src      SpO2 99 %     Weight      Height      Head Circumference      Peak Flow      Pain Score      Pain Loc      Pain Education      Exclude from Growth Chart     Most recent vital signs: Vitals:   03/16/24 0535  BP: (!) 135/95  Pulse: 70  SpO2: 99%     General: Somnolent, arouses to painful stimuli CV:  Regular rate, good peripheral perfusion.  Resp:  Unlabored respirations, lungs clear to auscultation Abd:  Nondistended, soft, no appreciable tenderness Neuro:  No gross facial asymmetry, awakens to sternal rub, moves all 4 extremities spontaneously and localizes,  does not follow commands, opens eyes to pain, moans, pupils 3 mm, reactive bilaterally. GCS 9.   ED Results / Procedures / Treatments   Labs (all labs ordered are listed, but only abnormal results are displayed) Labs Reviewed  ACETAMINOPHEN LEVEL - Abnormal; Notable for the following components:      Result Value   Acetaminophen (Tylenol), Serum <10 (*)    All other components within normal limits  SALICYLATE LEVEL - Abnormal; Notable for the following components:   Salicylate Lvl <7.0 (*)    All other components within normal limits  CBC WITH DIFFERENTIAL/PLATELET  COMPREHENSIVE METABOLIC PANEL WITH GFR  MAGNESIUM  LACTIC ACID, PLASMA  URINE DRUG SCREEN, QUALITATIVE ([facility] ONLY)  LAMOTRIGINE LEVEL  CK  POC URINE PREG, ED     EKG EKG independently reviewed interpreted by myself (ER attending) demonstrates:  EKG demonstrates sinus rhythm rate of 75, PR 248, QRS 106, QTc 480, no acute ST changes  RADIOLOGY Imaging independently reviewed and interpreted by myself demonstrates:  CT head without acute bleed  PROCEDURES:  Critical Care performed: No  Procedures   MEDICATIONS ORDERED IN ED: Medications - No data to display   IMPRESSION / MDM /  ASSESSMENT AND PLAN / ED COURSE  I reviewed the triage vital signs and the nursing notes.  Differential diagnosis includes, but is not limited to, intentional or accidental overdose, lower suspicion for consideration for anemia, electrolyte abnormality, acute intracranial process  Patient's presentation is most consistent with acute presentation with potential threat to life or bodily function.  38 year old female presenting with altered mental status.  Stable vitals on presentation.  Given altered mental status, will obtain labs, CT to further evaluate and give patient time to metabolize.  Has previously been admitted for suicide attempt, but clinical history today is more suggestive of an accidental overdose.  Will hold off on IVC  until further information can be obtained.   Clinical Course as of 03/16/24 0706  Thu Mar 16, 2024  0700 Initial labs reassuring, CK, Lamictal level, UDS pending.  No significant change in mental status currently.  Signed out to oncoming physician pending reassessment for return to baseline mental status and evaluation of possible suicidal intent and disposition. [NR]    Clinical Course User Index [NR] Trinna Post, MD     FINAL CLINICAL IMPRESSION(S) / ED DIAGNOSES   Final diagnoses:  Decreased responsiveness     Rx / DC Orders   ED Discharge Orders     None        Note:  This document was prepared using Dragon voice recognition software and may include unintentional dictation errors.   Trinna Post, MD 03/16/24 [phone]

## 2024-03-16 NOTE — H&P (Signed)
 History and Physical    Patient: [name] RJJ:[id number] DOB: 1986/06/24 DOA: 03/16/2024 DOS: the patient was seen and examined on 03/16/2024 PCP: Larae Grooms, NP  Patient coming from: Home  Chief Complaint:  Chief Complaint  Patient presents with   Altered Mental Status   HPI: [name] is a 38 y.o. female with medical history significant of depression, suicidal ideation, polysubstance abuse, overdose presenting with encephalopathy, suspected overdose.  Limited history in the setting of encephalopathy.  Patient noted to be found lethargic at local [place] gas station.  Local Police Department called.  Patient subsequently arrested.  Noted to have became more progressively unresponsive within the jail.  Given Narcan with minimal improvement in symptoms.  Noted admission January 2025 for similar issues with formal psychiatric evaluation for bipolar disorder as well as overdose including pentoxifylline and benzodiazepines. Presented to the ER afebrile, hemodynamically stable.  Satting well on room air.  White count 6.4, hemoglobin 14.6, platelets 207, creatinine 0.91, Tylenol level within normal limits, CT head today within normal limits. Review of Systems: As mentioned in the history of present illness. All other systems reviewed and are negative. Past Medical History:  Diagnosis Date   Borderline personality disorder (HCC) 04/16/2013   Cannabis abuse 2014   Chondromalacia patellae, right knee    Depression    Well-controlled with Zoloft   Gestational diabetes    Has prediabetes now.   Headache    Heroin abuse (HCC)    History of drug use 12/15/2017   IDA (iron deficiency anemia)    Migraines    Morbid obesity with BMI of 40.0-44.9, adult (HCC)    Nicotine dependence with current use 06/26/2021   Osteoarthritis, knee    Past Surgical History:  Procedure Laterality Date   ANKLE SURGERY Left 12/07/2010   CESAREAN SECTION  12/07/2009   CESAREAN SECTION WITH  BILATERAL TUBAL LIGATION Bilateral 06/15/2018   Procedure: CESAREAN SECTION WITH BILATERAL TUBAL LIGATION;  Surgeon: Suzy Bouchard, MD;  Location: [facility] ORS;  Service: Obstetrics;  Laterality: Bilateral;   CHOLECYSTECTOMY N/A 09/27/2016   Procedure: LAPAROSCOPIC CHOLECYSTECTOMY WITH INTRAOPERATIVE CHOLANGIOGRAM;  Surgeon: Lattie Haw, MD;  Location: [facility] ORS;  Service: General;  Laterality: N/A;   DILATION AND CURETTAGE OF UTERUS  12/08/2011   KNEE SURGERY Right 08/05/2022   TONSILLECTOMY  12/07/2005   Social History:  reports that she has quit smoking. Her smoking use included cigarettes. She has a 13 pack-year smoking history. She has never used smokeless tobacco. She reports current alcohol use. She reports that she does not currently use drugs.  Allergies  Allergen Reactions   Clindamycin/Lincomycin Anaphylaxis and Rash   Gabapentin Swelling    Leg swelling   Amoxicillin Rash    Has patient had a PCN reaction causing immediate rash, facial/tongue/throat swelling, SOB or lightheadedness with hypotension: Yes Has patient had a PCN reaction causing severe rash involving mucus membranes or skin necrosis: No Has patient had a PCN reaction that required hospitalization: No Has patient had a PCN reaction occurring within the last 10 years: No If all of the above answers are "NO", then may proceed with Cephalosporin use.     Family History  Problem Relation Age of Onset   Arthritis Mother    Cancer Mother        skin   Hyperlipidemia Mother        takes meds   Coronary artery disease Mother        non-obstructive   Cancer Father  Lung   Anxiety disorder Sister    Depression Sister    ADD / ADHD Son    Heart attack Maternal Grandfather 32   Coronary artery disease Maternal Grandfather 21       s/p CABG    Prior to Admission medications   Medication Sig Start Date End Date Taking? Authorizing Provider  albuterol (VENTOLIN HFA) 108 (90 Base) MCG/ACT inhaler  INHALE 2 PUFFS INTO THE LUNGS EVERY 6 HOURS AS NEEDED FOR WHEEZING OR SHORTNESS OF BREATH 11/15/23  Yes Larae Grooms, NP  baclofen (LIORESAL) 20 MG tablet Take 1 tablet (20 mg total) by mouth 3 (three) times daily. 02/15/24  Yes Larae Grooms, NP  buPROPion (WELLBUTRIN XL) 150 MG 24 hr tablet Take 1 tablet (150 mg total) by mouth daily. 02/15/24  Yes Larae Grooms, NP  cyclobenzaprine (FLEXERIL) 10 MG tablet Take 1 tablet by mouth 3 (three) times daily as needed. 11/17/23  Yes [provider]  DULoxetine (CYMBALTA) 60 MG capsule Take 60 mg by mouth at bedtime. 01/18/24  Yes [provider]  lamoTRIgine (LAMICTAL) 100 MG tablet Take 1 tablet (100 mg total) by mouth daily. Patient taking differently: Take 150 mg by mouth daily. 12/17/23 03/16/24 Yes Myriam Forehand, NP  lidocaine (LIDODERM) 5 % Place 1 patch onto the skin daily. 01/03/24  Yes [provider]  LORazepam (ATIVAN) 1 MG tablet Take 1 tablet (1 mg total) by mouth in the morning, at noon, and at bedtime. 02/15/24  Yes Larae Grooms, NP  ondansetron (ZOFRAN-ODT) 4 MG disintegrating tablet Take 1 tablet (4 mg total) by mouth every 8 (eight) hours as needed. 01/22/24  Yes Shirlee Latch, PA-C  traZODone (DESYREL) 100 MG tablet Take 1 tablet (100 mg total) by mouth at bedtime. 12/16/23 03/16/24 Yes Myriam Forehand, NP  venlafaxine XR (EFFEXOR XR) 150 MG 24 hr capsule Take 1 capsule (150 mg total) by mouth daily with breakfast. 02/15/24  Yes Larae Grooms, NP    Physical Exam: Vitals:   03/16/24 1230 03/16/24 1400 03/16/24 1433 03/16/24 1522  BP: 131/78 (!) 148/94    Pulse: 72 75  (!) 58  Resp: 20 (!) 22  (!) 21  Temp:   98.1 F (36.7 C)   SpO2: 100% 100%  100%   Physical Exam Constitutional:      Appearance: She is obese.     Comments: Lethargic at the bedside   HENT:     Head: Normocephalic and atraumatic.     Mouth/Throat:     Mouth: Mucous membranes are moist.  Eyes:     Pupils: Pupils are  equal, round, and reactive to light.  Cardiovascular:     Rate and Rhythm: Normal rate and regular rhythm.  Pulmonary:     Effort: Pulmonary effort is normal.  Abdominal:     General: Bowel sounds are normal.  Musculoskeletal:        General: Normal range of motion.  Skin:    General: Skin is warm.  Neurological:     General: No focal deficit present.  Psychiatric:        Mood and Affect: Mood normal.     Data Reviewed:  There are no new results to review at this time.  CT Head Wo Contrast CLINICAL DATA:  Mental status change.  Progressive unresponsiveness.  EXAM: CT HEAD WITHOUT CONTRAST  TECHNIQUE: Contiguous axial images were obtained from the base of the skull through the vertex without intravenous contrast.  RADIATION DOSE REDUCTION: This  exam was performed according to the departmental dose-optimization program which includes automated exposure control, adjustment of the mA and/or kV according to patient size and/or use of iterative reconstruction technique.  COMPARISON:  12/12/2023  FINDINGS: Brain: No evidence of acute infarction, hemorrhage, hydrocephalus, extra-axial collection or mass lesion/mass effect.  Vascular: No hyperdense vessel or unexpected calcification.  Skull: Normal. Negative for fracture or focal lesion.  Sinuses/Orbits: No acute finding.  Other: None.  IMPRESSION: No acute intracranial abnormalities.  Electronically Signed   By: Signa Kell M.D.   On: 03/16/2024 06:46  Lab Results  Component Value Date   WBC 6.4 03/16/2024   HGB 14.6 03/16/2024   HCT 42.5 03/16/2024   MCV 85.9 03/16/2024   PLT 207 03/16/2024   Last metabolic panel Lab Results  Component Value Date   GLUCOSE 81 03/16/2024   NA 142 03/16/2024   K 4.5 03/16/2024   CL 106 03/16/2024   CO2 26 03/16/2024   BUN 11 03/16/2024   CREATININE 0.91 03/16/2024   GFRNONAA >60 03/16/2024   CALCIUM 9.6 03/16/2024   PROT 7.3 03/16/2024   ALBUMIN 4.4 03/16/2024    LABGLOB 1.9 09/03/2021   AGRATIO 2.2 09/03/2021   BILITOT 1.1 03/16/2024   ALKPHOS 57 03/16/2024   AST 32 03/16/2024   ALT 34 03/16/2024   ANIONGAP 10 03/16/2024    Assessment and Plan: * Acute encephalopathy Positive generalized encephalopathy with concern for drug overdose-arrested at [facility]  Noted prior history of suicide attempt associated with polysubstance abuse Will monitor for now CT head within normal limits UDS  Nonfocal neuroexam Behavioral health consulted [place] PD at the bedside   Chronic obstructive pulmonary disease (HCC) Stable from a respiratory standpoint Continue home inhalers  Suicidal behavior Noted prior history of suicidal attempts associated with drug overdose Suicide precautions Behavioral health consult Follow-up recommendations      Advance Care Planning:   Code Status: Full Code   Consults: Behavioral Health  Family Communication: No family at the bedside   Severity of Illness: The appropriate patient status for this patient is OBSERVATION. Observation status is judged to be reasonable and necessary in order to provide the required intensity of service to ensure the patient's safety. The patient's presenting symptoms, physical exam findings, and initial radiographic and laboratory data in the context of their medical condition is felt to place them at decreased risk for further clinical deterioration. Furthermore, it is anticipated that the patient will be medically stable for discharge from the hospital within 2 midnights of admission.   Author: Floydene Flock, MD 03/16/2024 4:23 PM  For on call review [email or web address].

## 2024-03-16 NOTE — ED Provider Notes (Signed)
 Vitals:   03/16/24 1000 03/16/24 1030  BP: (!) 153/97 (!) 149/99  Pulse: 62 68  Resp: 19 17  Temp:    SpO2: 100% 100%    ----------------------------------------- 11:11 AM on 03/16/2024 ----------------------------------------- Patient now easily arouses to saying her name.  She opens her eyes is able to maintain eye contact and recognizes that she is at the hospital.  Condition appears much improved.  She currently is resting without distress.  Plan to trial ambulation and if she is able to ambulate well, remains with good alertness plan to discharge.   Sharyn Creamer, MD 03/16/24 [phone]

## 2024-03-16 NOTE — Assessment & Plan Note (Addendum)
 Positive generalized encephalopathy with concern for drug overdose-arrested at [facility]  Noted prior history of suicide attempt associated with polysubstance abuse Will monitor for now CT head within normal limits UDS  Nonfocal neuroexam Behavioral health consulted [place] PD at the bedside

## 2024-03-16 NOTE — ED Provider Notes (Signed)
 EKG performed at 1250 heart rate 60 QRS 90 QTc 440.  Sinus rhythm with first-degree AV block.  No evidence of acute ischemia or high heart block.  Of note patient on monitor, showing no further episodes of significant sinus bradycardia.  Question if this may have been subsequent orthostatic or vasovagal type event.  Continue to observe, will hydrate and reassess   Sharyn Creamer, MD 03/16/24 1350

## 2024-03-16 NOTE — Assessment & Plan Note (Signed)
 Stable from a respiratory standpoint Continue home inhalers

## 2024-03-16 NOTE — ED Provider Notes (Signed)
 Nursing working to ambulate patient.  Noted that when she went to get up her heart rate dropped to mid to low 30s.  Appears to be in sinus on telemetry monitoring at this time.  Will repeat EKG.  Patient asymptomatic, remains somewhat somnolent though.  Will continue to monitor   Sharyn Creamer, MD 03/16/24 1144

## 2024-03-16 NOTE — ED Provider Notes (Addendum)
 The patient is resting quite placcidly.  Even unlabored respirations.  She is very difficult to arouse.  Her GCS is currently 11.  Opens eyes to mild noxious stimuli.  Inappropriate words.  Localizes discomfort.   Vitals:   03/16/24 0630 03/16/24 0700  BP: (!) 142/92 (!) 137/92  Pulse: 74 71  Resp:  18  SpO2: 100% 99%     Her pupils are noted to be fairly constricted bilaterally.  Her respiratory rate is approximately 12, slightly shallow.  Being monitored very closely.  When she does open her eyes she does make some comments such as "my feet cold" but cannot maintain alertness for more than a 3-4 seconds. Will trial small additional dose of narcan for response.       Sharyn Creamer, MD 03/16/24 [phone]   She is noted to be protecting her airway.  Does not appear to be at high risk for aspiration.  We are continue to monitor her closely.    Sharyn Creamer, MD 03/16/24 [phone]

## 2024-03-17 ENCOUNTER — Encounter: Payer: Self-pay | Admitting: Family Medicine

## 2024-03-17 DIAGNOSIS — T1491XA Suicide attempt, initial encounter: Secondary | ICD-10-CM

## 2024-03-17 DIAGNOSIS — J449 Chronic obstructive pulmonary disease, unspecified: Secondary | ICD-10-CM | POA: Diagnosis not present

## 2024-03-17 DIAGNOSIS — G9340 Encephalopathy, unspecified: Secondary | ICD-10-CM | POA: Diagnosis not present

## 2024-03-17 DIAGNOSIS — F1910 Other psychoactive substance abuse, uncomplicated: Secondary | ICD-10-CM | POA: Diagnosis not present

## 2024-03-17 LAB — URINALYSIS, ROUTINE W REFLEX MICROSCOPIC
Bilirubin Urine: NEGATIVE
Glucose, UA: 150 mg/dL — AB
Hgb urine dipstick: NEGATIVE
Ketones, ur: 20 mg/dL — AB
Leukocytes,Ua: NEGATIVE
Nitrite: NEGATIVE
Protein, ur: NEGATIVE mg/dL
Specific Gravity, Urine: 1.019 (ref 1.005–1.030)
pH: 6 (ref 5.0–8.0)

## 2024-03-17 LAB — COMPREHENSIVE METABOLIC PANEL WITH GFR
ALT: 26 U/L (ref 0–44)
AST: 22 U/L (ref 15–41)
Albumin: 3.7 g/dL (ref 3.5–5.0)
Alkaline Phosphatase: 49 U/L (ref 38–126)
Anion gap: 7 (ref 5–15)
BUN: 7 mg/dL (ref 6–20)
CO2: 28 mmol/L (ref 22–32)
Calcium: 8.8 mg/dL — ABNORMAL LOW (ref 8.9–10.3)
Chloride: 106 mmol/L (ref 98–111)
Creatinine, Ser: 0.78 mg/dL (ref 0.44–1.00)
GFR, Estimated: >60 mL/min (ref >60)
Glucose, Bld: 99 mg/dL (ref 70–99)
Potassium: 3.6 mmol/L (ref 3.5–5.1)
Sodium: 141 mmol/L (ref 135–145)
Total Bilirubin: 0.8 mg/dL (ref 0.0–1.2)
Total Protein: 6.2 g/dL — ABNORMAL LOW (ref 6.5–8.1)

## 2024-03-17 LAB — URINE DRUG SCREEN, QUALITATIVE (ARMC ONLY)
Amphetamines, Ur Screen: NOT DETECTED
Barbiturates, Ur Screen: NOT DETECTED
Benzodiazepine, Ur Scrn: POSITIVE — AB
Cannabinoid 50 Ng, Ur ~~LOC~~: POSITIVE — AB
Cocaine Metabolite,Ur ~~LOC~~: POSITIVE — AB
MDMA (Ecstasy)Ur Screen: NOT DETECTED
Methadone Scn, Ur: NOT DETECTED
Opiate, Ur Screen: NOT DETECTED
Phencyclidine (PCP) Ur S: NOT DETECTED
Tricyclic, Ur Screen: POSITIVE — AB

## 2024-03-17 LAB — CBC
HCT: 41.5 % (ref 36.0–46.0)
Hemoglobin: 14.2 g/dL (ref 12.0–15.0)
MCH: 29.4 pg (ref 26.0–34.0)
MCHC: 34.2 g/dL (ref 30.0–36.0)
MCV: 85.9 fL (ref 80.0–100.0)
Platelets: 201 10*3/uL (ref 150–400)
RBC: 4.83 MIL/uL (ref 3.87–5.11)
RDW: 12.9 % (ref 11.5–15.5)
WBC: 7.1 10*3/uL (ref 4.0–10.5)
nRBC: 0 % (ref 0.0–0.2)

## 2024-03-17 LAB — LAMOTRIGINE LEVEL: Lamotrigine Lvl: 1 ug/mL — ABNORMAL LOW (ref 2.0–20.0)

## 2024-03-17 LAB — HIV ANTIBODY (ROUTINE TESTING W REFLEX): HIV Screen 4th Generation wRfx: NONREACTIVE

## 2024-03-17 LAB — GLUCOSE, CAPILLARY
Glucose-Capillary: 86 mg/dL (ref 70–99)
Glucose-Capillary: 87 mg/dL (ref 70–99)

## 2024-03-17 LAB — ETHANOL: Alcohol, Ethyl (B): <10 mg/dL (ref <10)

## 2024-03-17 NOTE — Plan of Care (Signed)

## 2024-03-17 NOTE — Consult Note (Signed)
 [name] Psychiatric Consult Initial  Patient Name: .[name]  MRN: [id number]  DOB: 1986/02/11  Consult Order details:  Orders (From admission, onward)     Start     Ordered   03/16/24 1453  IP CONSULT TO PSYCHIATRY       Ordering Provider: Sharyn Creamer, MD  Provider:  (Not yet assigned)  Question Answer Comment  Place call to: psych   Reason for Consult Consult   Diagnosis/Clinical Info for Consult: overdose suspected, intent unknown      03/16/24 1452             Mode of Visit: In person    Psychiatry Consult Evaluation  Service Date: March 17, 2024 LOS:  LOS: 0 days  Chief Complaint "I cracked on me"  Primary Psychiatric Diagnoses  Substance abuse 2.  Bipolar I 3.  BPD  Assessment  [name] is a 38 y.o. female admitted: Medically for 03/16/2024  5:31 AM for Substance abuse problems. She carries the psychiatric diagnoses of  Bipolar, BPD, MDD,  GAD  and suicidal ideations. .  She reports that she was brought to ED after overdosing  on crack/Cocaine. Patient states "I cracked on me". She reports that she got into an argument with her mother who is her primary support and went to use crack to the point of passing out. She reports that she tried to drive but was feeling very lethargic and went to the gas station. An employee noticed her behaviors and called for help. Patient was taken to jail then started exhibiting acute medical symptoms and was brought to the hospital. Patient reports that she was driving then pulled over as she was feeling tired "I still had my pipe in my hands".  Patient reports a hx of polysubstance abuse and has been in rehab. She reports a hx of opioid use which she stopped 7 years ago. She has been using crack/cocaine since.  Patient denies SI/HI/AVH.  Per chart review, patient was hospitalized at [name] after a suspected drug overdose.  She was stabilized and  discharged with medications. She was recommended to follow up in outpatient  services.    Diagnoses:  Active Hospital problems: Principal Problem:   Acute encephalopathy Active Problems:   Suicidal behavior   Chronic obstructive pulmonary disease (HCC)   Substance abuse (HCC)    Plan   ## Psychiatric Medication Recommendations:  Patient meets criteria for  inpatient treatment for stabilization  ## Medical Decision Making Capacity:  recommending inpatient treatment      ## Disposition:-- We recommend inpatient psychiatric hospitalization when medically cleared. Patient is under voluntary admission status at this time; please IVC if attempts to leave hospital.  ## Behavioral / Environmental: - Patients with borderline personality traits/disorder often use the language of physical pain to communicate both physical and emotional suffering. It is important to address pain complaints as they arise and attempt to identify an etiology, either organic or psychiatric. In patients with chronic pain, it is important to have a discussion with the patient about expectations about pain control.    ## Safety and Observation Level:  - Based on my clinical evaluation, I estimate the patient to be at high risk of self harm in the current setting. - At this time, we recommend  routine. This decision is based on my review of the chart including patient's history and current presentation, interview of the patient, mental status examination, and consideration of suicide risk including evaluating suicidal ideation, plan,  intent, suicidal or self-harm behaviors, risk factors, and protective factors. This judgment is based on our ability to directly address suicide risk, implement suicide prevention strategies, and develop a safety plan while the patient is in the clinical setting. Please contact our team if there is a concern that risk level has changed.  CSSR Risk Category:C-SSRS RISK CATEGORY: No Risk  Suicide Risk Assessment: Patient has following modifiable risk factors for  suicide: under treated depression  and medication noncompliance, which we are addressing  recommending treatment. Patient has following non-modifiable or demographic risk factors for suicide: history of suicide attempt, history of self harm behavior, and psychiatric hospitalization Patient has the following protective factors against suicide: Supportive family, Supportive friends, and Minor children in the home  Thank you for this consult request. Recommendations have been communicated to the primary team.  We will recommend Inpatient hospitalization upon medical clearance.    Olin Pia, NP       History of Present Illness  Relevant Aspects of [name] Course:  Admitted on 03/16/2024 for medical stabilization.    Psych ROS:  Depression: current Anxiety:  current Mania (lifetime and current): current Psychosis: (lifetime and current): NA   ROS : Patient is alert and oriented x4. Denies  respiratory distress. Denies  chest/back/abdominal pain. Denies nausea/vomiting. Denies dizziness/syncope.   Psychiatric and Social History  Psychiatric History:  Information collected from Patient  Prev Dx/Sx: Bipolar, MDD, BPD, GAD, Substance abuse Current Psych Provider: NA Home Meds (current): Wellbutrin, Flexeril, Ativan, Trazodone, Effexor Previous Med Trials: NA Therapy: current at [name]. Saw a therapist a month ago  Prior Psych Hospitalization: last hospitalization at [name]  January 2025 Prior Self Harm: reports Prior Violence: reports  Family Psych History: NA Family Hx suicide: NA  Social History:  Developmental Hx: NA Educational Hx: NA Occupational Hx: Currently unemployed. Lost employment in March 2025 Legal Hx: Patient to return to jail upon discharge Living Situation: Lives with mother and her 38 year-old son Spiritual Hx: NA Access to weapons/lethal means: NA   Substance History Alcohol: occasionally. Last use, yesterday  Type of alcohol beer Last Drink  yesterday Number of drinks per day 1 History of alcohol withdrawal seizures Denies History of DT's Denies Tobacco: Denies Illicit drugs: Crack/cocaine Prescription drug abuse: denies Rehab hx: Hx of substance abuse rehab at [employer] house  Exam Findings  Physical Exam:  Vital Signs:  Temp:  [97.7 F (36.5 C)-98.2 F (36.8 C)] 97.9 F (36.6 C) (04/11 1535) Pulse Rate:  [78-95] 78 (04/11 1535) Resp:  [18-19] 18 (04/11 1535) BP: (110-135)/(70-87) 125/81 (04/11 1535) SpO2:  [94 %-100 %] 100 % (04/11 1535) Weight:  [78 kg] 78 kg (04/10 1800) Blood pressure 125/81, pulse 78, temperature 97.9 F (36.6 C), resp. rate 18, weight 78 kg, SpO2 100%. Body mass index is 29.52 kg/m.  Physical Exam  Mental Status Exam: General Appearance: Casual  Orientation:  Full (Time, Place, and Person)  Memory:  Immediate;   Fair Recent;   Fair Remote;   Fair  Concentration:  Concentration: Fair and Attention Span: Fair  Recall:  Fair  Attention  Fair  Eye Contact:  Fair  Speech:  Clear and Coherent  Language:  Fair  Volume:  Normal  Mood: labile, irritable  Affect:  Depressed  Thought Process:  Coherent  Thought Content:  WDL  Suicidal Thoughts:  No  Homicidal Thoughts:  No  Judgement:  Fair  Insight:  Fair  Psychomotor Activity:  Restlessness  Akathisia:  NA  Fund  of Knowledge:  Fair      Assets:  Manufacturing systems engineer Desire for Improvement Housing Physical Health Social Support  Cognition:  WNL  ADL's:  Intact  AIMS (if indicated):        Other History   These have been pulled in through the EMR, reviewed, and updated if appropriate.  Family History:  The patient's family history includes ADD / ADHD in her son; Anxiety disorder in her sister; Arthritis in her mother; Cancer in her father and mother; Coronary artery disease in her mother; Coronary artery disease (age of onset: 56) in her maternal grandfather; Depression in her sister; Heart attack (age of onset: 64) in her  maternal grandfather; Hyperlipidemia in her mother.  Medical History: Past Medical History:  Diagnosis Date   Borderline personality disorder (HCC) 04/16/2013   Cannabis abuse 2014   Chondromalacia patellae, right knee    Depression    Well-controlled with Zoloft   Gestational diabetes    Has prediabetes now.   Headache    Heroin abuse (HCC)    History of drug use 12/15/2017   IDA (iron deficiency anemia)    Migraines    Morbid obesity with BMI of 40.0-44.9, adult (HCC)    Nicotine dependence with current use 06/26/2021   Osteoarthritis, knee     Surgical History: Past Surgical History:  Procedure Laterality Date   ANKLE SURGERY Left 12/07/2010   CESAREAN SECTION  12/07/2009   CESAREAN SECTION WITH BILATERAL TUBAL LIGATION Bilateral 06/15/2018   Procedure: CESAREAN SECTION WITH BILATERAL TUBAL LIGATION;  Surgeon: Suzy Bouchard, MD;  Location: [facility] ORS;  Service: Obstetrics;  Laterality: Bilateral;   CHOLECYSTECTOMY N/A 09/27/2016   Procedure: LAPAROSCOPIC CHOLECYSTECTOMY WITH INTRAOPERATIVE CHOLANGIOGRAM;  Surgeon: Lattie Haw, MD;  Location: [facility] ORS;  Service: General;  Laterality: N/A;   DILATION AND CURETTAGE OF UTERUS  12/08/2011   KNEE SURGERY Right 08/05/2022   TONSILLECTOMY  12/07/2005     Medications:   Current Facility-Administered Medications:    acetaminophen (TYLENOL) tablet 650 mg, 650 mg, Oral, Q6H PRN, Floydene Flock, MD, 650 mg at 03/17/24 1545   dextrose 5 % and 0.45 % NaCl infusion, , Intravenous, Continuous, Floydene Flock, MD, Last Rate: 100 mL/hr at 03/17/24 1524, New Bag at 03/17/24 1524   enoxaparin (LOVENOX) injection 40 mg, 40 mg, Subcutaneous, Q24H, Floydene Flock, MD, 40 mg at 03/16/24 2119   naloxone [name]) injection 0.4 mg, 0.4 mg, Intravenous, PRN, Floydene Flock, MD   ondansetron [facility]) tablet 4 mg, 4 mg, Oral, Q6H PRN **OR** ondansetron (ZOFRAN) injection 4 mg, 4 mg, Intravenous, Q6H PRN, Floydene Flock,  MD  Allergies: Allergies  Allergen Reactions   Clindamycin/Lincomycin Anaphylaxis and Rash   Gabapentin Swelling    Leg swelling   Amoxicillin Rash    Has patient had a PCN reaction causing immediate rash, facial/tongue/throat swelling, SOB or lightheadedness with hypotension: Yes Has patient had a PCN reaction causing severe rash involving mucus membranes or skin necrosis: No Has patient had a PCN reaction that required hospitalization: No Has patient had a PCN reaction occurring within the last 10 years: No If all of the above answers are "NO", then may proceed with Cephalosporin use.     Olin Pia, NP

## 2024-03-17 NOTE — Progress Notes (Signed)
 Progress Note   Patient: [name] UEA:[id number] DOB: May 25, 1986 DOA: 03/16/2024     0 DOS: the patient was seen and examined on 03/17/2024   Brief hospital course: [name] is a 38 y.o. female with medical history significant of depression, suicidal ideation, polysubstance abuse, overdose presenting with encephalopathy, suspected overdose.   Assessment and Plan: * Acute encephalopathy Most likely due to polysubstance use. Urine drug screen positive for benzos, cocaine, cannabinoid, tricyclic's. Patient is more alert awake, able to answer me appropriately. Prior h/o suicidal ideation. Psychiatry team evaluation called who advised inpatient psych facility. [place] [employer] at the bedside  Chronic obstructive pulmonary disease (HCC) Stable from a respiratory standpoint Continue home inhalers  Suicidal behavior Noted prior history of suicidal attempts associated with drug overdose Suicide precautions Behavioral health consulted. Follow-up recommendations.     Out of bed to chair. Incentive spirometry. Nursing supportive care. Fall, aspiration precautions. Diet:  Diet Orders (From admission, onward)     Start     Ordered   03/16/24 1914  Diet regular Room service appropriate? Yes; Fluid consistency: Thin  Diet effective now       Question Answer Comment  Room service appropriate? Yes   Fluid consistency: Thin      03/16/24 1914           DVT prophylaxis: enoxaparin (LOVENOX) injection 40 mg Start: 03/16/24 2200  Level of care: Telemetry Medical   Code Status: Full Code  Subjective: Patient is seen and examined today morning.  She is answering appropriately, more alert and awake.  She denies any complaints.  Eating fair.  Police at bedside.  Physical Exam: Vitals:   03/17/24 0012 03/17/24 0411 03/17/24 0919 03/17/24 1535  BP: 122/79 110/70 112/76 125/81  Pulse: 85 80 83 78  Resp: 19 19 18 18   Temp: 97.9 F (36.6 C) 98.2 F (36.8 C) 98 F (36.7  C) 97.9 F (36.6 C)  TempSrc: Oral  Oral   SpO2: 99% 94% 98% 100%  Weight:        General -  Young Caucasian female, no apparent distress HEENT - PERRLA, EOMI, atraumatic head, non tender sinuses. Lung - Clear, no rales, rhonchi, wheezes. Heart - S1, S2 heard, no murmurs, rubs, no pedal edema. Abdomen - Soft, non tender, bowel sounds good Neuro - Alert, awake and oriented x 3, non focal exam. Skin - Warm and dry.  Data Reviewed:      Latest Ref Rng & Units 03/17/2024    4:42 AM 03/16/2024    6:00 AM 12/12/2023    9:11 AM  CBC  WBC 4.0 - 10.5 K/uL 7.1  6.4  7.2   Hemoglobin 12.0 - 15.0 g/dL 47.8  29.5  62.1   Hematocrit 36.0 - 46.0 % 41.5  42.5  43.1   Platelets 150 - 400 K/uL 201  207  170       Latest Ref Rng & Units 03/17/2024    4:42 AM 03/16/2024    6:00 AM 12/12/2023    9:11 AM  BMP  Glucose 70 - 99 mg/dL 99  81  308   BUN 6 - 20 mg/dL 7  11  10    Creatinine 0.44 - 1.00 mg/dL 6.57  8.46  9.62   Sodium 135 - 145 mmol/L 141  142  138   Potassium 3.5 - 5.1 mmol/L 3.6  4.5  3.2   Chloride 98 - 111 mmol/L 106  106  105   CO2 22 -  32 mmol/L 28  26  23    Calcium 8.9 - 10.3 mg/dL 8.8  9.6  8.7    CT Head Wo Contrast Result Date: 03/16/2024 CLINICAL DATA:  Mental status change.  Progressive unresponsiveness. EXAM: CT HEAD WITHOUT CONTRAST TECHNIQUE: Contiguous axial images were obtained from the base of the skull through the vertex without intravenous contrast. RADIATION DOSE REDUCTION: This exam was performed according to the departmental dose-optimization program which includes automated exposure control, adjustment of the mA and/or kV according to patient size and/or use of iterative reconstruction technique. COMPARISON:  12/12/2023 FINDINGS: Brain: No evidence of acute infarction, hemorrhage, hydrocephalus, extra-axial collection or mass lesion/mass effect. Vascular: No hyperdense vessel or unexpected calcification. Skull: Normal. Negative for fracture or focal lesion.  Sinuses/Orbits: No acute finding. Other: None. IMPRESSION: No acute intracranial abnormalities. Electronically Signed   By: Signa Kell M.D.   On: 03/16/2024 06:46    Family Communication: Discussed with patient, she understand and agree. All questions answered.  Disposition: Status is: Observation The patient remains OBS appropriate and will d/c before 2 midnights.  Planned Discharge Destination:  inpatient psych facility     Time spent: 35 minutes  Author: Marcelino Duster, MD 03/17/2024 6:06 PM Secure chat 7am to 7pm For on call review [email or web address].

## 2024-03-18 DIAGNOSIS — J449 Chronic obstructive pulmonary disease, unspecified: Secondary | ICD-10-CM | POA: Diagnosis not present

## 2024-03-18 DIAGNOSIS — G9340 Encephalopathy, unspecified: Secondary | ICD-10-CM | POA: Diagnosis not present

## 2024-03-18 DIAGNOSIS — F1910 Other psychoactive substance abuse, uncomplicated: Secondary | ICD-10-CM | POA: Diagnosis not present

## 2024-03-18 DIAGNOSIS — G928 Other toxic encephalopathy: Secondary | ICD-10-CM

## 2024-03-18 DIAGNOSIS — F319 Bipolar disorder, unspecified: Secondary | ICD-10-CM

## 2024-03-18 NOTE — Plan of Care (Signed)

## 2024-03-18 NOTE — Discharge Summary (Addendum)
 Physician Discharge Summary   Patient: [name] MRN: [id number] DOB: 01/25/1986  Admit date:     03/16/2024  Discharge date:   Discharge Physician: Aisha Hove   PCP: Aileen Alexanders, NP   Recommendations at discharge:    PCP follow up and psychiatry follow up advised. She is being discharged to correction facility.  Discharge Diagnoses: Principal Problem:   Toxic metabolic encephalopathy Active Problems:   Cocaine abuse (HCC)   Suicidal behavior   Chronic obstructive pulmonary disease (HCC)   Bipolar disorder (HCC)   Substance abuse (HCC)  Resolved Problems:   * No resolved hospital problems. *  Hospital Course: [name] is a 38 y.o. female with medical history significant of depression, suicidal ideation, polysubstance abuse, overdose presenting with encephalopathy, suspected overdose. She is accompanied by police.  Urine drug screen positive for benzos, cocaine, cannabinoid, tricyclic. Her mental status much improved. Psychiatry advised inpatient psych facility placement. But as she is under police custody, no psych transfer advised. She is being discharged to correction facility. Advised to quit illicit drugs. I advised her to follow up with PCP, psychiatry upon discharge as instructed.   Assessment and Plan: * Acute toxic metabolic encephalopathy Due to polysubstance use. Urine drug screen positive for benzos, cocaine, cannabinoid, tricyclic's. Patient is more alert awake, able to answer me appropriately. No withdrawal symptoms. Prior h/o suicidal ideation. Psychiatry team evaluation initially advised inpatient psych facility but as she is under police custody, she is safe to be discharged to correction facility. Patient understands to follow up with PCP and psychiatry upon release from jail.   Chronic obstructive pulmonary disease (HCC) Stable from a respiratory standpoint Continue home inhalers   Suicidal behavior Noted prior history of  suicidal attempts associated with drug overdose Suicide precautions Behavioral health consulted. Follow-up recommendations.  Poysubstance abuse- Advised her to quit illicit drugs.       Consultants: Psychiatry Procedures performed: none  Disposition:  Correction facility Diet recommendation:  Discharge Diet Orders (From admission, onward)     Start     Ordered   03/18/24 0000  Diet - low sodium heart healthy        03/18/24 1043           Cardiac diet DISCHARGE MEDICATION: Allergies as of 03/18/2024       Reactions   Clindamycin/lincomycin Anaphylaxis, Rash   Gabapentin Swelling   Leg swelling   Amoxicillin Rash   Has patient had a PCN reaction causing immediate rash, facial/tongue/throat swelling, SOB or lightheadedness with hypotension: Yes Has patient had a PCN reaction causing severe rash involving mucus membranes or skin necrosis: No Has patient had a PCN reaction that required hospitalization: No Has patient had a PCN reaction occurring within the last 10 years: No If all of the above answers are "NO", then may proceed with Cephalosporin use.        Medication List     TAKE these medications    baclofen 20 MG tablet Commonly known as: LIORESAL Take 1 tablet (20 mg total) by mouth 3 (three) times daily.   buPROPion 150 MG 24 hr tablet Commonly known as: Wellbutrin XL Take 1 tablet (150 mg total) by mouth daily.   cyclobenzaprine 10 MG tablet Commonly known as: FLEXERIL Take 1 tablet by mouth 3 (three) times daily as needed.   DULoxetine 60 MG capsule Commonly known as: CYMBALTA Take 60 mg by mouth at bedtime.   lamoTRIgine 100 MG tablet Commonly known as: LAMICTAL Take 1  tablet (100 mg total) by mouth daily. What changed: how much to take   lidocaine 5 % Commonly known as: LIDODERM Place 1 patch onto the skin daily.   LORazepam 1 MG tablet Commonly known as: ATIVAN Take 1 tablet (1 mg total) by mouth in the morning, at noon, and at  bedtime.   ondansetron 4 MG disintegrating tablet Commonly known as: ZOFRAN-ODT Take 1 tablet (4 mg total) by mouth every 8 (eight) hours as needed.   traZODone 100 MG tablet Commonly known as: DESYREL Take 1 tablet (100 mg total) by mouth at bedtime.   venlafaxine XR 150 MG 24 hr capsule Commonly known as: Effexor XR Take 1 capsule (150 mg total) by mouth daily with breakfast.   Ventolin HFA 108 (90 Base) MCG/ACT inhaler Generic drug: albuterol INHALE 2 PUFFS INTO THE LUNGS EVERY 6 HOURS AS NEEDED FOR WHEEZING OR SHORTNESS OF BREATH        Follow-up Information     Aileen Alexanders, NP Follow up in 1 week(s).   Specialty: Nurse Practitioner Contact information: [address] [place] [address] [phone]                Discharge Exam: Cleavon Curls Weights   03/16/24 1800  Weight: 78 kg      03/18/2024    8:36 AM 03/18/2024    5:39 AM 03/17/2024    8:16 PM  Vitals with BMI  Systolic 130 123 403  Diastolic 83 82 85  Pulse 70 74 70    General -  Young Caucasian female, no apparent distress HEENT - PERRLA, EOMI, atraumatic head, non tender sinuses. Lung - Clear, no rales, rhonchi, wheezes. Heart - S1, S2 heard, no murmurs, rubs, no pedal edema. Abdomen - Soft, non tender, bowel sounds good Neuro - Alert, awake and oriented x 3, non focal exam. Skin - Warm and dry.  Condition at discharge: stable  The results of significant diagnostics from this hospitalization (including imaging, microbiology, ancillary and laboratory) are listed below for reference.   Imaging Studies: CT Head Wo Contrast Result Date: 03/16/2024 CLINICAL DATA:  Mental status change.  Progressive unresponsiveness. EXAM: CT HEAD WITHOUT CONTRAST TECHNIQUE: Contiguous axial images were obtained from the base of the skull through the vertex without intravenous contrast. RADIATION DOSE REDUCTION: This exam was performed according to the departmental dose-optimization program which includes  automated exposure control, adjustment of the mA and/or kV according to patient size and/or use of iterative reconstruction technique. COMPARISON:  12/12/2023 FINDINGS: Brain: No evidence of acute infarction, hemorrhage, hydrocephalus, extra-axial collection or mass lesion/mass effect. Vascular: No hyperdense vessel or unexpected calcification. Skull: Normal. Negative for fracture or focal lesion. Sinuses/Orbits: No acute finding. Other: None. IMPRESSION: No acute intracranial abnormalities. Electronically Signed   By: Kimberley Penman M.D.   On: 03/16/2024 06:46    Microbiology: Results for orders placed or performed in visit on 10/19/23  Rapid Strep screen([facility])     Status: None   Collection Time: 10/19/23  8:57 AM   Specimen: Other   Other  Result Value Ref Range Status   Strep Gp A Ag, IA W/Reflex Negative Negative Final  Culture, Group A Strep     Status: None   Collection Time: 10/19/23  8:57 AM   Other  Result Value Ref Range Status   Strep A Culture Negative  Final    Comment:  Reference Range: Negative    Labs: CBC: Recent Labs  Lab 03/16/24 0600 03/17/24 0442  WBC 6.4 7.1  NEUTROABS 3.5  --   HGB 14.6 14.2  HCT 42.5 41.5  MCV 85.9 85.9  PLT 207 201   Basic Metabolic Panel: Recent Labs  Lab 03/16/24 0600 03/17/24 0442  NA 142 141  K 4.5 3.6  CL 106 106  CO2 26 28  GLUCOSE 81 99  BUN 11 7  CREATININE 0.91 0.78  CALCIUM 9.6 8.8*  MG 2.4  --    Liver Function Tests: Recent Labs  Lab 03/16/24 0600 03/17/24 0442  AST 32 22  ALT 34 26  ALKPHOS 57 49  BILITOT 1.1 0.8  PROT 7.3 6.2*  ALBUMIN 4.4 3.7   CBG: Recent Labs  Lab 03/16/24 1814 03/16/24 1847 03/16/24 1858 03/17/24 0738 03/17/24 1539  GLUCAP 46* 57* 103* 86 87    Discharge time spent: 35 minutes.  Signed: Aisha Hove, MD [facility] 03/18/2024

## 2024-03-18 NOTE — Plan of Care (Signed)

## 2024-03-18 NOTE — Progress Notes (Signed)
 MD order received in [facility] to discharge pt back to jail/civil court today; verbally reviewed AVS with pt and the [employer] present at the bedside; no new medications ordered; no questions voiced at this time; pt discharged ambulatory accompanied by the [employer]

## 2024-03-23 NOTE — Telephone Encounter (Signed)
 Called and LVM explaining to patient that sometimes insurance with cover weight loss medications, depending on things like the patient's BMI. Advised patient that she would need an appointment to discuss this with Mariah Shines before anything can we sent in. Asked for patient to please call and schedule an appointment if she would like to discuss this with Mariah Shines.

## 2024-05-22 ENCOUNTER — Ambulatory Visit: Payer: MEDICAID | Admitting: Nurse Practitioner

## 2024-05-22 NOTE — Progress Notes (Deleted)
 There were no vitals taken for this visit.   Subjective:    Patient ID: [name], female    DOB: [birth year], 38 y.o.   MRN: [id number]  HPI: [name] is a 38 y.o. female  No chief complaint on file.  Patient has complicated psychiatric history.  Recently IVC'd for possible overdose in January.  Since discharge is back seeing [facility].  Now on Wellbutrin  150mg , Effexor  150mg , and Lamicatal 100mg .  She is also using Trazodone  at bedtime.  States she feels like this regimen is working well for her.  Denies SI.  Patient states she has been having back pain for several years.  Patient was previously on baclofen  20mg  TID from another provider.  Patient states it is low back pain.  Patient went to see [facility] and was given the option for shots or surgery.  At this time, she prefers not to do either.    Relevant past medical, surgical, family and social history reviewed and updated as indicated. Interim medical history since our last visit reviewed. Allergies and medications reviewed and updated.  Review of Systems  Musculoskeletal:  Positive for back pain.  Psychiatric/Behavioral:  Positive for dysphoric mood. The patient is nervous/anxious.     Per HPI unless specifically indicated above     Objective:    There were no vitals taken for this visit.  Wt Readings from Last 3 Encounters:  03/16/24 171 lb 15.3 oz (78 kg)  02/15/24 193 lb 9.6 oz (87.8 kg)  01/22/24 200 lb (90.7 kg)    Physical Exam Vitals and nursing note reviewed.  Constitutional:      General: She is not in acute distress.    Appearance: Normal appearance. She is normal weight. She is not ill-appearing, toxic-appearing or diaphoretic.  HENT:     Head: Normocephalic.     Right Ear: External ear normal.     Left Ear: External ear normal.     Nose: Nose normal.     Mouth/Throat:     Mouth: Mucous membranes are moist.     Pharynx: Oropharynx is clear.   Eyes:     General:        Right eye: No  discharge.        Left eye: No discharge.     Extraocular Movements: Extraocular movements intact.     Conjunctiva/sclera: Conjunctivae normal.     Pupils: Pupils are equal, round, and reactive to light.    Cardiovascular:     Rate and Rhythm: Normal rate and regular rhythm.     Heart sounds: No murmur heard. Pulmonary:     Effort: Pulmonary effort is normal. No respiratory distress.     Breath sounds: Normal breath sounds. No wheezing or rales.   Musculoskeletal:     Cervical back: Normal range of motion and neck supple.   Skin:    General: Skin is warm and dry.     Capillary Refill: Capillary refill takes less than 2 seconds.   Neurological:     General: No focal deficit present.     Mental Status: She is alert and oriented to person, place, and time. Mental status is at baseline.   Psychiatric:        Mood and Affect: Mood normal.        Behavior: Behavior normal.        Thought Content: Thought content normal.        Judgment: Judgment normal.     Results for  orders placed or performed during the hospital encounter of 03/16/24  Urine Drug Screen, Qualitative   Collection Time: 03/16/24  5:00 AM  Result Value Ref Range   Tricyclic, Ur Screen POSITIVE (A) NONE DETECTED   Amphetamines, Ur Screen NONE DETECTED NONE DETECTED   MDMA (Ecstasy)Ur Screen NONE DETECTED NONE DETECTED   Cocaine Metabolite,Ur [place] POSITIVE (A) NONE DETECTED   Opiate, Ur Screen NONE DETECTED NONE DETECTED   Phencyclidine (PCP) Ur S NONE DETECTED NONE DETECTED   Cannabinoid 50 Ng, Ur [place] POSITIVE (A) NONE DETECTED   Barbiturates, Ur Screen NONE DETECTED NONE DETECTED   Benzodiazepine, Ur Scrn POSITIVE (A) NONE DETECTED   Methadone Scn, Ur NONE DETECTED NONE DETECTED  Urinalysis, Routine w reflex microscopic -Urine, Clean Catch   Collection Time: 03/16/24  5:00 AM  Result Value Ref Range   Color, Urine YELLOW (A) YELLOW   APPearance HAZY (A) CLEAR   Specific Gravity, Urine 1.019 1.005 - 1.030    pH 6.0 5.0 - 8.0   Glucose, UA 150 (A) NEGATIVE mg/dL   Hgb urine dipstick NEGATIVE NEGATIVE   Bilirubin Urine NEGATIVE NEGATIVE   Ketones, ur 20 (A) NEGATIVE mg/dL   Protein, ur NEGATIVE NEGATIVE mg/dL   Nitrite NEGATIVE NEGATIVE   Leukocytes,Ua NEGATIVE NEGATIVE  CBC with Differential   Collection Time: 03/16/24  6:00 AM  Result Value Ref Range   WBC 6.4 4.0 - 10.5 K/uL   RBC 4.95 3.87 - 5.11 MIL/uL   Hemoglobin 14.6 12.0 - 15.0 g/dL   HCT 46.9 62.9 - 52.8 %   MCV 85.9 80.0 - 100.0 fL   MCH 29.5 26.0 - 34.0 pg   MCHC 34.4 30.0 - 36.0 g/dL   RDW 41.3 24.4 - 01.0 %   Platelets 207 150 - 400 K/uL   nRBC 0.0 0.0 - 0.2 %   Neutrophils Relative % 55 %   Neutro Abs 3.5 1.7 - 7.7 K/uL   Lymphocytes Relative 37 %   Lymphs Abs 2.4 0.7 - 4.0 K/uL   Monocytes Relative 6 %   Monocytes Absolute 0.4 0.1 - 1.0 K/uL   Eosinophils Relative 1 %   Eosinophils Absolute 0.1 0.0 - 0.5 K/uL   Basophils Relative 1 %   Basophils Absolute 0.0 0.0 - 0.1 K/uL   Immature Granulocytes 0 %   Abs Immature Granulocytes 0.01 0.00 - 0.07 K/uL  Comprehensive metabolic panel   Collection Time: 03/16/24  6:00 AM  Result Value Ref Range   Sodium 142 135 - 145 mmol/L   Potassium 4.5 3.5 - 5.1 mmol/L   Chloride 106 98 - 111 mmol/L   CO2 26 22 - 32 mmol/L   Glucose, Bld 81 70 - 99 mg/dL   BUN 11 6 - 20 mg/dL   Creatinine, Ser 2.72 0.44 - 1.00 mg/dL   Calcium 9.6 8.9 - 53.6 mg/dL   Total Protein 7.3 6.5 - 8.1 g/dL   Albumin 4.4 3.5 - 5.0 g/dL   AST 32 15 - 41 U/L   ALT 34 0 - 44 U/L   Alkaline Phosphatase 57 38 - 126 U/L   Total Bilirubin 1.1 0.0 - 1.2 mg/dL   GFR, Estimated >64 >40 mL/min   Anion gap 10 5 - 15  Acetaminophen  level   Collection Time: 03/16/24  6:00 AM  Result Value Ref Range   Acetaminophen  (Tylenol ), Serum <10 (L) 10 - 30 ug/mL  Salicylate level   Collection Time: 03/16/24  6:00 AM  Result Value Ref Range  Salicylate Lvl <7.0 (L) 7.0 - 30.0 mg/dL  Lamotrigine  level   Collection  Time: 03/16/24  6:00 AM  Result Value Ref Range   Lamotrigine  Lvl 1.0 (L) 2.0 - 20.0 ug/mL  Magnesium    Collection Time: 03/16/24  6:00 AM  Result Value Ref Range   Magnesium  2.4 1.7 - 2.4 mg/dL  Lactic acid, plasma   Collection Time: 03/16/24  6:00 AM  Result Value Ref Range   Lactic Acid, Venous 1.2 0.5 - 1.9 mmol/L  CK   Collection Time: 03/16/24  6:00 AM  Result Value Ref Range   Total CK 80 38 - 234 U/L  Glucose, capillary   Collection Time: 03/16/24  5:54 PM  Result Value Ref Range   Glucose-Capillary 49 (L) 70 - 99 mg/dL  Glucose, capillary   Collection Time: 03/16/24  6:00 PM  Result Value Ref Range   Glucose-Capillary 44 (LL) 70 - 99 mg/dL   Comment 1 Notify RN   Glucose, capillary   Collection Time: 03/16/24  6:14 PM  Result Value Ref Range   Glucose-Capillary 46 (L) 70 - 99 mg/dL  Glucose, capillary   Collection Time: 03/16/24  6:47 PM  Result Value Ref Range   Glucose-Capillary 57 (L) 70 - 99 mg/dL  Glucose, capillary   Collection Time: 03/16/24  6:58 PM  Result Value Ref Range   Glucose-Capillary 103 (H) 70 - 99 mg/dL  HIV Antibody (routine testing w rflx)   Collection Time: 03/16/24  8:03 PM  Result Value Ref Range   HIV Screen 4th Generation wRfx Non Reactive Non Reactive  CBC   Collection Time: 03/17/24  4:42 AM  Result Value Ref Range   WBC 7.1 4.0 - 10.5 K/uL   RBC 4.83 3.87 - 5.11 MIL/uL   Hemoglobin 14.2 12.0 - 15.0 g/dL   HCT 40.9 81.1 - 91.4 %   MCV 85.9 80.0 - 100.0 fL   MCH 29.4 26.0 - 34.0 pg   MCHC 34.2 30.0 - 36.0 g/dL   RDW 78.2 95.6 - 21.3 %   Platelets 201 150 - 400 K/uL   nRBC 0.0 0.0 - 0.2 %  Comprehensive metabolic panel   Collection Time: 03/17/24  4:42 AM  Result Value Ref Range   Sodium 141 135 - 145 mmol/L   Potassium 3.6 3.5 - 5.1 mmol/L   Chloride 106 98 - 111 mmol/L   CO2 28 22 - 32 mmol/L   Glucose, Bld 99 70 - 99 mg/dL   BUN 7 6 - 20 mg/dL   Creatinine, Ser 0.86 0.44 - 1.00 mg/dL   Calcium 8.8 (L) 8.9 - 10.3  mg/dL   Total Protein 6.2 (L) 6.5 - 8.1 g/dL   Albumin 3.7 3.5 - 5.0 g/dL   AST 22 15 - 41 U/L   ALT 26 0 - 44 U/L   Alkaline Phosphatase 49 38 - 126 U/L   Total Bilirubin 0.8 0.0 - 1.2 mg/dL   GFR, Estimated >57 >84 mL/min   Anion gap 7 5 - 15  Glucose, capillary   Collection Time: 03/17/24  7:38 AM  Result Value Ref Range   Glucose-Capillary 86 70 - 99 mg/dL  Glucose, capillary   Collection Time: 03/17/24  3:39 PM  Result Value Ref Range   Glucose-Capillary 87 70 - 99 mg/dL  Ethanol   Collection Time: 03/17/24  5:20 PM  Result Value Ref Range   Alcohol, Ethyl (B) <10 <10 mg/dL      Assessment & Plan:  Problem List Items Addressed This Visit       Other   Borderline personality disorder (HCC) - Primary    Chronic.  Patient has complicated psychiatric history including substance abuse.  Has been seen at [facility] in the past and currently being seen at [facility].  On Lamictal , Effexor  and Lorazepam .  Anxiety is not well controlled likely due to Lorazepam  being short acting.  Will refer patient to new psychiatrist for management of medications.  Follow up in 1 month.       Relevant Orders   Ambulatory referral to Psychiatry   Other Visit Diagnoses     Chronic left-sided low back pain without sciatica       Chronic condition. Previously on Baclofen  with Sublucaid. Came off medication while in jail. No longer seeing provider for Sublocaid but needs refill of Baclofen .  Will obtain xray of lumbar spine and refill Baclofen  today. May need referral for ortho in the future.   Relevant Medications   lamoTRIgine  (LAMICTAL ) 150 MG tablet   venlafaxine  (EFFEXOR ) 100 MG tablet   venlafaxine  (EFFEXOR ) 75 MG tablet   baclofen  (LIORESAL ) 20 MG tablet   Other Relevant Orders   DG Lumbar Spine Complete        Follow up plan: No follow-ups on file.

## 2024-06-08 ENCOUNTER — Ambulatory Visit: Payer: Self-pay

## 2024-06-08 NOTE — Telephone Encounter (Signed)
 This RN made second attempt to triage patient. No answer, LVM. Routing for additional attempts.

## 2024-06-08 NOTE — Telephone Encounter (Signed)
 Call dropped before agent could transfer. Call placed to patient-voicemail left to call back to nurse triage.   Copied from CRM [id number]. Topic: Clinical - Red Word Triage >> Jun 08, 2024 11:51 AM Kevelyn M wrote: Red Word that prompted transfer to Nurse Triage: Worsening back pain in the past two months.

## 2024-06-08 NOTE — Telephone Encounter (Signed)
 FYI Only or Action Required?: FYI only for provider.  Patient was last seen in primary care on 02/15/2024 by Melvin Pao, NP. Called Nurse Triage reporting Back Pain. Symptoms began about a month ago. Interventions attempted: Prescription medications: baclofen . Symptoms are: gradually worsening.  Triage Disposition: See PCP Within 2 Weeks  Patient/caregiver understands and will follow disposition?: Yes Reason for Disposition . Back pain is a chronic symptom (recurrent or ongoing AND present > 4 weeks)  Answer Assessment - Initial Assessment Questions 1. ONSET: When did the pain begin?      Started worsening a month ago 2. LOCATION: Where does it hurt? (upper, mid or lower back)     Lower back  3. SEVERITY: How bad is the pain?  (e.g., Scale 1-10; mild, moderate, or severe)   - MILD (1-3): Doesn't interfere with normal activities.    - MODERATE (4-7): Interferes with normal activities or awakens from sleep.    - SEVERE (8-10): Excruciating pain, unable to do any normal activities.      Rates pain a 12 at this time 4. PATTERN: Is the pain constant? (e.g., yes, no; constant, intermittent)      Constant 5. RADIATION: Does the pain shoot into your legs or somewhere else?     Denies  6. CAUSE:  What do you think is causing the back pain?      Chronic issue 7. BACK OVERUSE:  Any recent lifting of heavy objects, strenuous work or exercise?     Denies 8. MEDICINES: What have you taken so far for the pain? (e.g., nothing, acetaminophen , NSAIDS)     Prescribed baclofen  9. NEUROLOGIC SYMPTOMS: Do you have any weakness, numbness, or problems with bowel/bladder control?     Denies weakness, denied numbness 10. OTHER SYMPTOMS: Do you have any other symptoms? (e.g., fever, abdomen pain, burning with urination, blood in urine)       Denies additional symptoms    Patient stated she could not come into the office this week or next week. Scheduled patient for first  available appointment that would work with her schedule. Patient hung up before care advice could be provided.  Protocols used: Back Pain-A-AH

## 2024-06-08 NOTE — Telephone Encounter (Signed)
Noted.  Will see pt on 7/16.

## 2024-06-12 ENCOUNTER — Ambulatory Visit (INDEPENDENT_AMBULATORY_CARE_PROVIDER_SITE_OTHER): Payer: MEDICAID | Admitting: Nurse Practitioner

## 2024-06-12 ENCOUNTER — Encounter: Payer: Self-pay | Admitting: Nurse Practitioner

## 2024-06-12 VITALS — BP 122/83 | HR 87 | Temp 98.0°F | Wt 217.0 lb

## 2024-06-12 DIAGNOSIS — R635 Abnormal weight gain: Secondary | ICD-10-CM

## 2024-06-12 DIAGNOSIS — R2242 Localized swelling, mass and lump, left lower limb: Secondary | ICD-10-CM

## 2024-06-12 LAB — URINALYSIS, ROUTINE W REFLEX MICROSCOPIC
Bilirubin, UA: NEGATIVE
Glucose, UA: NEGATIVE
Ketones, UA: NEGATIVE
Leukocytes,UA: NEGATIVE
Nitrite, UA: NEGATIVE
Protein,UA: NEGATIVE
RBC, UA: NEGATIVE
Specific Gravity, UA: 1.015 (ref 1.005–1.030)
Urobilinogen, Ur: 2 mg/dL — ABNORMAL HIGH (ref 0.2–1.0)
pH, UA: 7.5 (ref 5.0–7.5)

## 2024-06-12 MED ORDER — FUROSEMIDE 20 MG PO TABS
20.0000 mg | ORAL_TABLET | Freq: Two times a day (BID) | ORAL | 0 refills | Status: DC
Start: 2024-06-12 — End: 2024-06-21

## 2024-06-12 NOTE — Progress Notes (Signed)
 BP 122/83   Pulse 87   Temp 98 F (36.7 C) (Oral)   Wt 217 lb (98.4 kg)   SpO2 97%   BMI 37.25 kg/m    Subjective:    Patient ID: [name], female    DOB: [birth year], 38 y.o.   MRN: [id number]  HPI: [name] is a 38 y.o. female  Chief Complaint  Patient presents with   Leg Swelling    Started about a week ago took mothers lasix  and it helped for awhile but the leg swelling came right back    Patient states she started having swelling in her legs that started a week ago.  She stopped taking the flexeril  due to it saying it can cause leg swelling.  It hasn't helped with the swelling.  She has been taking her mom's lasix  and it isn't helping.  States she hasn't noticed much of a difference with the lasix .  She does wear compression socks at work.  Not drinking a lot of water.  Does not add salt to her diet.  Doesn't eat fast food. Feels like she had gained 25lbs in the last week. Has gained 23lbs since March.  Patient states she feels like it is all in the last week.   Relevant past medical, surgical, family and social history reviewed and updated as indicated. Interim medical history since our last visit reviewed. Allergies and medications reviewed and updated.  Review of Systems  Cardiovascular:  Positive for leg swelling.    Per HPI unless specifically indicated above     Objective:    BP 122/83   Pulse 87   Temp 98 F (36.7 C) (Oral)   Wt 217 lb (98.4 kg)   SpO2 97%   BMI 37.25 kg/m   Wt Readings from Last 3 Encounters:  06/12/24 217 lb (98.4 kg)  02/15/24 193 lb 9.6 oz (87.8 kg)  01/22/24 200 lb (90.7 kg)    Physical Exam Vitals and nursing note reviewed.  Constitutional:      General: She is not in acute distress.    Appearance: Normal appearance. She is normal weight. She is not ill-appearing, toxic-appearing or diaphoretic.  HENT:     Head: Normocephalic.     Right Ear: External ear normal.     Left Ear: External ear normal.     Nose:  Nose normal.     Mouth/Throat:     Mouth: Mucous membranes are moist.     Pharynx: Oropharynx is clear.  Eyes:     General:        Right eye: No discharge.        Left eye: No discharge.     Extraocular Movements: Extraocular movements intact.     Conjunctiva/sclera: Conjunctivae normal.     Pupils: Pupils are equal, round, and reactive to light.  Cardiovascular:     Rate and Rhythm: Normal rate and regular rhythm.     Heart sounds: No murmur heard. Pulmonary:     Effort: Pulmonary effort is normal. No respiratory distress.     Breath sounds: Normal breath sounds. No wheezing or rales.  Musculoskeletal:     Cervical back: Normal range of motion and neck supple.     Right lower leg: 1+ Pitting Edema present.     Left lower leg: 1+ Pitting Edema present.  Skin:    General: Skin is warm and dry.     Capillary Refill: Capillary refill takes less than 2 seconds.  Neurological:  General: No focal deficit present.     Mental Status: She is alert and oriented to person, place, and time. Mental status is at baseline.  Psychiatric:        Mood and Affect: Mood normal.        Behavior: Behavior normal.        Thought Content: Thought content normal.        Judgment: Judgment normal.     Results for orders placed or performed during the hospital encounter of 12/12/23  Comprehensive metabolic panel   Collection Time: 12/12/23  9:11 AM  Result Value Ref Range   Sodium 138 135 - 145 mmol/L   Potassium 3.2 (L) 3.5 - 5.1 mmol/L   Chloride 105 98 - 111 mmol/L   CO2 23 22 - 32 mmol/L   Glucose, Bld 150 (H) 70 - 99 mg/dL   BUN 10 6 - 20 mg/dL   Creatinine, Ser 9.27 0.44 - 1.00 mg/dL   Calcium 8.7 (L) 8.9 - 10.3 mg/dL   Total Protein 6.9 6.5 - 8.1 g/dL   Albumin 4.1 3.5 - 5.0 g/dL   AST 25 15 - 41 U/L   ALT 29 0 - 44 U/L   Alkaline Phosphatase 73 38 - 126 U/L   Total Bilirubin 0.7 0.0 - 1.2 mg/dL   GFR, Estimated >39 >39 mL/min   Anion gap 10 5 - 15  Salicylate level    Collection Time: 12/12/23  9:11 AM  Result Value Ref Range   Salicylate Lvl <7.0 (L) 7.0 - 30.0 mg/dL  Acetaminophen  level   Collection Time: 12/12/23  9:11 AM  Result Value Ref Range   Acetaminophen  (Tylenol ), Serum <10 (L) 10 - 30 ug/mL  Ethanol   Collection Time: 12/12/23  9:11 AM  Result Value Ref Range   Alcohol, Ethyl (B) <10 <10 mg/dL  CBC WITH DIFFERENTIAL   Collection Time: 12/12/23  9:11 AM  Result Value Ref Range   WBC 7.2 4.0 - 10.5 K/uL   RBC 4.96 3.87 - 5.11 MIL/uL   Hemoglobin 14.3 12.0 - 15.0 g/dL   HCT 56.8 63.9 - 53.9 %   MCV 86.9 80.0 - 100.0 fL   MCH 28.8 26.0 - 34.0 pg   MCHC 33.2 30.0 - 36.0 g/dL   RDW 86.8 88.4 - 84.4 %   Platelets 170 150 - 400 K/uL   nRBC 0.0 0.0 - 0.2 %   Neutrophils Relative % 65 %   Neutro Abs 4.7 1.7 - 7.7 K/uL   Lymphocytes Relative 30 %   Lymphs Abs 2.1 0.7 - 4.0 K/uL   Monocytes Relative 4 %   Monocytes Absolute 0.3 0.1 - 1.0 K/uL   Eosinophils Relative 1 %   Eosinophils Absolute 0.1 0.0 - 0.5 K/uL   Basophils Relative 0 %   Basophils Absolute 0.0 0.0 - 0.1 K/uL   Immature Granulocytes 0 %   Abs Immature Granulocytes 0.01 0.00 - 0.07 K/uL  hCG, quantitative, pregnancy   Collection Time: 12/12/23  9:11 AM  Result Value Ref Range   hCG, Beta Chain, Quant, S <1 <5 mIU/mL  Lithium  level   Collection Time: 12/12/23  9:11 AM  Result Value Ref Range   Lithium  Lvl <0.06 (L) 0.60 - 1.20 mmol/L  CK   Collection Time: 12/12/23  9:11 AM  Result Value Ref Range   Total CK 349 (H) 38 - 234 U/L  Magnesium    Collection Time: 12/12/23  9:11 AM  Result Value Ref Range  Magnesium  2.3 1.7 - 2.4 mg/dL  CBG monitoring, ED   Collection Time: 12/12/23  9:31 AM  Result Value Ref Range   Glucose-Capillary 144 (H) 70 - 99 mg/dL  Lactic acid, plasma   Collection Time: 12/12/23 10:42 AM  Result Value Ref Range   Lactic Acid, Venous 1.5 0.5 - 1.9 mmol/L  Lamotrigine  level   Collection Time: 12/12/23 11:29 AM  Result Value Ref Range    Lamotrigine  Lvl 1.2 (L) 2.0 - 20.0 ug/mL  Acetaminophen  level   Collection Time: 12/12/23  1:42 PM  Result Value Ref Range   Acetaminophen  (Tylenol ), Serum <10 (L) 10 - 30 ug/mL  Urine Drug Screen, Qualitative   Collection Time: 12/13/23  2:44 AM  Result Value Ref Range   Tricyclic, Ur Screen POSITIVE (A) NONE DETECTED   Amphetamines, Ur Screen NONE DETECTED NONE DETECTED   MDMA (Ecstasy)Ur Screen NONE DETECTED NONE DETECTED   Cocaine Metabolite,Ur [place] NONE DETECTED NONE DETECTED   Opiate, Ur Screen NONE DETECTED NONE DETECTED   Phencyclidine (PCP) Ur S NONE DETECTED NONE DETECTED   Cannabinoid 50 Ng, Ur  POSITIVE (A) NONE DETECTED   Barbiturates, Ur Screen NONE DETECTED NONE DETECTED   Benzodiazepine, Ur Scrn POSITIVE (A) NONE DETECTED   Methadone Scn, Ur NONE DETECTED NONE DETECTED  Urinalysis, Routine w reflex microscopic -Urine, Clean Catch   Collection Time: 12/13/23  2:44 AM  Result Value Ref Range   Color, Urine YELLOW (A) YELLOW   APPearance CLOUDY (A) CLEAR   Specific Gravity, Urine 1.016 1.005 - 1.030   pH 5.0 5.0 - 8.0   Glucose, UA NEGATIVE NEGATIVE mg/dL   Hgb urine dipstick NEGATIVE NEGATIVE   Bilirubin Urine NEGATIVE NEGATIVE   Ketones, ur NEGATIVE NEGATIVE mg/dL   Protein, ur 30 (A) NEGATIVE mg/dL   Nitrite NEGATIVE NEGATIVE   Leukocytes,Ua LARGE (A) NEGATIVE   RBC / HPF 11-20 0 - 5 RBC/hpf   WBC, UA >50 0 - 5 WBC/hpf   Bacteria, UA RARE (A) NONE SEEN   Squamous Epithelial / HPF 0-5 0 - 5 /HPF   WBC Clumps PRESENT    Mucus PRESENT       Assessment & Plan:   Problem List Items Addressed This Visit   None Visit Diagnoses       Localized swelling of left lower extremity    -  Primary   Will start Lasix  20mg  BID.  Increase water intake, continue with compression socks.  Labs ordered during visit.  Referral placed for Cardiology.   Relevant Orders   Comp Met (CMET)   CBC w/Diff   Urinalysis, Routine w reflex microscopic   Ambulatory referral to  Cardiology     Weight gain       23lbs since March.  Referral placed for Cardiology.   Relevant Orders   Ambulatory referral to Cardiology        Follow up plan: Return in about 1 week (around 06/19/2024) for Lower extremity swelling.

## 2024-06-13 ENCOUNTER — Ambulatory Visit: Payer: Self-pay | Admitting: Nurse Practitioner

## 2024-06-13 LAB — CBC WITH DIFFERENTIAL/PLATELET
Basophils Absolute: 0 x10E3/uL (ref 0.0–0.2)
Basos: 1 %
EOS (ABSOLUTE): 0.1 x10E3/uL (ref 0.0–0.4)
Eos: 2 %
Hematocrit: 37.1 % (ref 34.0–46.6)
Hemoglobin: 11.7 g/dL (ref 11.1–15.9)
Immature Grans (Abs): 0 x10E3/uL (ref 0.0–0.1)
Immature Granulocytes: 0 %
Lymphocytes Absolute: 2.2 x10E3/uL (ref 0.7–3.1)
Lymphs: 39 %
MCH: 29.4 pg (ref 26.6–33.0)
MCHC: 31.5 g/dL (ref 31.5–35.7)
MCV: 93 fL (ref 79–97)
Monocytes Absolute: 0.5 x10E3/uL (ref 0.1–0.9)
Monocytes: 8 %
Neutrophils Absolute: 2.7 x10E3/uL (ref 1.4–7.0)
Neutrophils: 50 %
Platelets: 246 x10E3/uL (ref 150–450)
RBC: 3.98 x10E6/uL (ref 3.77–5.28)
RDW: 13 % (ref 11.7–15.4)
WBC: 5.5 x10E3/uL (ref 3.4–10.8)

## 2024-06-13 LAB — COMPREHENSIVE METABOLIC PANEL WITH GFR
ALT: 20 IU/L (ref 0–32)
AST: 21 IU/L (ref 0–40)
Albumin: 4 g/dL (ref 3.9–4.9)
Alkaline Phosphatase: 83 IU/L (ref 44–121)
BUN/Creatinine Ratio: 14 (ref 9–23)
BUN: 11 mg/dL (ref 6–20)
Bilirubin Total: 0.4 mg/dL (ref 0.0–1.2)
CO2: 26 mmol/L (ref 20–29)
Calcium: 8.6 mg/dL — ABNORMAL LOW (ref 8.7–10.2)
Chloride: 102 mmol/L (ref 96–106)
Creatinine, Ser: 0.78 mg/dL (ref 0.57–1.00)
Globulin, Total: 1.8 g/dL (ref 1.5–4.5)
Glucose: 56 mg/dL — ABNORMAL LOW (ref 70–99)
Potassium: 4.4 mmol/L (ref 3.5–5.2)
Sodium: 141 mmol/L (ref 134–144)
Total Protein: 5.8 g/dL — ABNORMAL LOW (ref 6.0–8.5)
eGFR: 100 mL/min/1.73 (ref >59)

## 2024-06-21 ENCOUNTER — Ambulatory Visit (INDEPENDENT_AMBULATORY_CARE_PROVIDER_SITE_OTHER): Payer: MEDICAID | Admitting: Nurse Practitioner

## 2024-06-21 ENCOUNTER — Encounter: Payer: Self-pay | Admitting: Nurse Practitioner

## 2024-06-21 VITALS — BP 118/81 | HR 82 | Temp 98.0°F | Wt 200.0 lb

## 2024-06-21 DIAGNOSIS — R2242 Localized swelling, mass and lump, left lower limb: Secondary | ICD-10-CM | POA: Diagnosis not present

## 2024-06-21 DIAGNOSIS — L0292 Furuncle, unspecified: Secondary | ICD-10-CM | POA: Diagnosis not present

## 2024-06-21 DIAGNOSIS — F603 Borderline personality disorder: Secondary | ICD-10-CM | POA: Diagnosis not present

## 2024-06-21 MED ORDER — LIDOCAINE 5 % EX PTCH: 1.0000 | MEDICATED_PATCH | Freq: Every day | CUTANEOUS | 2 refills | Status: DC

## 2024-06-21 MED ORDER — SULFAMETHOXAZOLE-TRIMETHOPRIM 800-160 MG PO TABS: 1.0000 | ORAL_TABLET | Freq: Two times a day (BID) | ORAL | 0 refills | Status: DC

## 2024-06-21 MED ORDER — TRAZODONE HCL 150 MG PO TABS: 150.0000 mg | ORAL_TABLET | Freq: Every day | ORAL | 1 refills | Status: AC

## 2024-06-21 MED ORDER — LAMOTRIGINE 150 MG PO TABS: 150.0000 mg | ORAL_TABLET | Freq: Every day | ORAL | 1 refills | Status: DC

## 2024-06-21 MED ORDER — VENLAFAXINE HCL ER 150 MG PO CP24: 150.0000 mg | ORAL_CAPSULE | Freq: Every day | ORAL | 1 refills | Status: AC

## 2024-06-21 NOTE — Progress Notes (Signed)
 BP 118/81   Pulse 82   Temp 98 F (36.7 C) (Oral)   Wt 200 lb (90.7 kg)   SpO2 98%   BMI 34.33 kg/m    Subjective:    Patient ID: [name], female    DOB: 05/09/1986, 38 y.o.   MRN: [id number]  HPI: [name] is a 38 y.o. female  Chief Complaint  Patient presents with   Follow-up   Patient states she started having swelling in her legs that started a week ago.  She stopped taking the flexeril  due to it saying it can cause leg swelling.  It hasn't helped with the swelling.  She has been taking her mom's lasix  and it isn't helping.  States she hasn't noticed much of a difference with the lasix .  She does wear compression socks at work.  Not drinking a lot of water.  Does not add salt to her diet.  Doesn't eat fast food. Feels like she had gained 25lbs in the last week. Has gained 23lbs since March.  Patient states she feels like it is all in the last week.  06/21/24: Swelling has improved from last week.  Patient has lost 17lbs since last visit.  She has been taking the Furosemide  and feels a lot better.  Not having and SOB.  Legs do swell after work now but resolves at night time.    Patient has a boil under left axillary area.  Has not come to a head. Very tender to touch with redness.   Relevant past medical, surgical, family and social history reviewed and updated as indicated. Interim medical history since our last visit reviewed. Allergies and medications reviewed and updated.  Review of Systems  Respiratory:  Negative for shortness of breath.   Cardiovascular:  Positive for leg swelling.  Skin:        boil    Per HPI unless specifically indicated above     Objective:    BP 118/81   Pulse 82   Temp 98 F (36.7 C) (Oral)   Wt 200 lb (90.7 kg)   SpO2 98%   BMI 34.33 kg/m   Wt Readings from Last 3 Encounters:  06/21/24 200 lb (90.7 kg)  06/12/24 217 lb (98.4 kg)  02/15/24 193 lb 9.6 oz (87.8 kg)    Physical Exam Vitals and nursing note  reviewed.  Constitutional:      General: She is not in acute distress.    Appearance: Normal appearance. She is normal weight. She is not ill-appearing, toxic-appearing or diaphoretic.  HENT:     Head: Normocephalic.     Right Ear: External ear normal.     Left Ear: External ear normal.     Nose: Nose normal.     Mouth/Throat:     Mouth: Mucous membranes are moist.     Pharynx: Oropharynx is clear.  Eyes:     General:        Right eye: No discharge.        Left eye: No discharge.     Extraocular Movements: Extraocular movements intact.     Conjunctiva/sclera: Conjunctivae normal.     Pupils: Pupils are equal, round, and reactive to light.  Cardiovascular:     Rate and Rhythm: Normal rate and regular rhythm.     Heart sounds: No murmur heard. Pulmonary:     Effort: Pulmonary effort is normal. No respiratory distress.     Breath sounds: Normal breath sounds. No wheezing or rales.  Musculoskeletal:     Cervical back: Normal range of motion and neck supple.  Skin:    General: Skin is warm and dry.     Capillary Refill: Capillary refill takes less than 2 seconds.      Neurological:     General: No focal deficit present.     Mental Status: She is alert and oriented to person, place, and time. Mental status is at baseline.  Psychiatric:        Mood and Affect: Mood normal.        Behavior: Behavior normal.        Thought Content: Thought content normal.        Judgment: Judgment normal.     Results for orders placed or performed in visit on 06/12/24  Urinalysis, Routine w reflex microscopic   Collection Time: 06/12/24 10:52 AM  Result Value Ref Range   Specific Gravity, UA 1.015 1.005 - 1.030   pH, UA 7.5 5.0 - 7.5   Color, UA Yellow Yellow   Appearance Ur Clear Clear   Leukocytes,UA Negative Negative   Protein,UA Negative Negative/Trace   Glucose, UA Negative Negative   Ketones, UA Negative Negative   RBC, UA Negative Negative   Bilirubin, UA Negative Negative    Urobilinogen, Ur 2.0 (H) 0.2 - 1.0 mg/dL   Nitrite, UA Negative Negative   Microscopic Examination Comment   Comp Met (CMET)   Collection Time: 06/12/24 10:54 AM  Result Value Ref Range   Glucose 56 (L) 70 - 99 mg/dL   BUN 11 6 - 20 mg/dL   Creatinine, Ser 9.21 0.57 - 1.00 mg/dL   eGFR 899 >40 fO/fpw/8.26   BUN/Creatinine Ratio 14 9 - 23   Sodium 141 134 - 144 mmol/L   Potassium 4.4 3.5 - 5.2 mmol/L   Chloride 102 96 - 106 mmol/L   CO2 26 20 - 29 mmol/L   Calcium 8.6 (L) 8.7 - 10.2 mg/dL   Total Protein 5.8 (L) 6.0 - 8.5 g/dL   Albumin 4.0 3.9 - 4.9 g/dL   Globulin, Total 1.8 1.5 - 4.5 g/dL   Bilirubin Total 0.4 0.0 - 1.2 mg/dL   Alkaline Phosphatase 83 44 - 121 IU/L   AST 21 0 - 40 IU/L   ALT 20 0 - 32 IU/L  CBC w/Diff   Collection Time: 06/12/24 10:54 AM  Result Value Ref Range   WBC 5.5 3.4 - 10.8 x10E3/uL   RBC 3.98 3.77 - 5.28 x10E6/uL   Hemoglobin 11.7 11.1 - 15.9 g/dL   Hematocrit 62.8 65.9 - 46.6 %   MCV 93 79 - 97 fL   MCH 29.4 26.6 - 33.0 pg   MCHC 31.5 31.5 - 35.7 g/dL   RDW 86.9 88.2 - 84.5 %   Platelets 246 150 - 450 x10E3/uL   Neutrophils 50 Not Estab. %   Lymphs 39 Not Estab. %   Monocytes 8 Not Estab. %   Eos 2 Not Estab. %   Basos 1 Not Estab. %   Neutrophils Absolute 2.7 1.4 - 7.0 x10E3/uL   Lymphocytes Absolute 2.2 0.7 - 3.1 x10E3/uL   Monocytes Absolute 0.5 0.1 - 0.9 x10E3/uL   EOS (ABSOLUTE) 0.1 0.0 - 0.4 x10E3/uL   Basophils Absolute 0.0 0.0 - 0.2 x10E3/uL   Immature Granulocytes 0 Not Estab. %   Immature Grans (Abs) 0.0 0.0 - 0.1 x10E3/uL      Assessment & Plan:   Problem List Items Addressed This Visit  Other   Borderline personality disorder (HCC) - Primary   Chronic.  Was followed by [facility] but has trouble following their program due to working.  Will refill medications during visit.  Patient on Lamitcal 150mg , Effexor  150mg , and Trazodone  150mg .  Follow up in 1 month.  Call sooner if concerns arise.       Other Visit Diagnoses        Localized swelling of left lower extremity       Improved with Lasix . Recommend using compression socks and increasing water intake. Elevate legs in the evenings. Make appt with Cardiology.     Boil       Will treat with bactrim .  Complete course of medications.   Relevant Medications   sulfamethoxazole -trimethoprim  (BACTRIM  DS) 800-160 MG tablet        Follow up plan: Return in about 1 month (around 07/22/2024) for Leg swelling.

## 2024-06-21 NOTE — Assessment & Plan Note (Signed)
 Chronic.  Was followed by [facility] but has trouble following their program due to working.  Will refill medications during visit.  Patient on Lamitcal 150mg , Effexor  150mg , and Trazodone  150mg .  Follow up in 1 month.  Call sooner if concerns arise.

## 2024-06-26 ENCOUNTER — Emergency Department: Admission: EM | Admit: 2024-06-26 | Discharge: 2024-06-26 | Discharge status: Against Medical Advice | Payer: MEDICAID

## 2024-06-26 ENCOUNTER — Ambulatory Visit: Payer: Self-pay

## 2024-06-26 NOTE — Telephone Encounter (Signed)
 I can do tomorrow at 3pm.

## 2024-06-26 NOTE — Telephone Encounter (Signed)
 FYI Only or Action Required?: Action required by provider: request for appointment.  Patient was last seen in primary care on 06/21/2024 by Melvin Pao, NP.  Called Nurse Triage reporting Recurrent Skin Infections.  Symptoms began a week ago.  Interventions attempted: Prescription medications: abx.  Symptoms are: gradually worsening.  Triage Disposition: See Physician Within 24 Hours  Patient/caregiver understands and will follow disposition?: yes  Copied from CRM [id number]. Topic: Clinical - Red Word Triage >> Jun 26, 2024  9:45 AM Mercedes MATSU wrote: Red Word that prompted transfer to Nurse Triage: Patient came to the doctor for a boil, she was prescribed antibiotic medication the boil has become 3xs larger and really deep and painful. Reason for Disposition  [1] Treatment (antibiotic, I&D, or moist heat) > 24 hours AND [2] symptoms WORSE (e.g., pain, spreading redness, swelling)  Answer Assessment - Initial Assessment Questions 1. APPEARANCE: What does the boil (abscess) look like?      3x size, red going all the way around it 2. LOCATION: Where is the boil located?      armpit 3. NUMBER: How many boils are there?      one 4. SIZE: How big is the boil? (e.g., inches, cm; compare to size of a coin or other object)     Golf ball 5. ONSET: When did the boil start?     A week atleast 6. PAIN: Is there any pain? If Yes, ask: How bad is the pain?  (Scale 1-10; or mild, moderate, severe)     10 7. FEVER: Do you have a fever? If Yes, ask: What is it, how was it measured, and when did it start?      denies 8. TREATMENT: What treatment did you get or are you getting for the boil? (e.g., I&D, antibiotics, moist heat)     Abx, 9. OTHER SYMPTOMS: Do you have any other symptoms? (e.g., rash elsewhere on body, shaking chills, spreading redness of nearby skin or red streaks, weakness)      denies 10. PREGNANCY: Is there any chance you are pregnant? When was your  last menstrual period?       denies  Protocols used: Boil (Skin Abscess) on Treatment Follow-up Call-[facility]

## 2024-06-26 NOTE — Telephone Encounter (Signed)
 Called patient and offered her Friday at 1120 with Darice and she stated that she can't wait that long, No one else has openings this week as of now, She wants a call back.

## 2024-06-27 NOTE — Telephone Encounter (Signed)
 Called patient and left a message for her to call back to get scheduled. Darice has agreed to see her today at 3pm if she wants to come in

## 2024-06-28 NOTE — Telephone Encounter (Signed)
 Patient went to the hospital and they took care of it

## 2024-07-11 ENCOUNTER — Encounter: Payer: Self-pay | Admitting: Oncology

## 2024-07-11 ENCOUNTER — Other Ambulatory Visit (HOSPITAL_COMMUNITY): Payer: Self-pay

## 2024-07-12 ENCOUNTER — Ambulatory Visit: Payer: MEDICAID | Attending: Nurse Practitioner | Admitting: Nurse Practitioner

## 2024-07-12 NOTE — Progress Notes (Deleted)
 Cardiology Clinic Note   Patient Name: [name] Date of Encounter: 07/12/2024  Primary Care Provider:  Melvin Pao, NP Primary Cardiologist:  None    Patient Profile    38 y.o. female with a history of palpitations, dizziness, obesity, COPD, depression, gestational diabetes/prediabetes, migraine headaches, polysubstance abuse, borderline personality disorder, history of suicide attempts  Past Medical History    Past Medical History:  Diagnosis Date   Borderline personality disorder (HCC) 04/16/2013   Cannabis abuse 2014   Chondromalacia patellae, right knee    Depression    Well-controlled with Zoloft    Gestational diabetes    Has prediabetes now.   Headache    Heroin abuse (HCC)    History of drug use 12/15/2017   IDA (iron deficiency anemia)    Migraines    Morbid obesity with BMI of 40.0-44.9, adult (HCC)    Nicotine  dependence with current use 06/26/2021   Osteoarthritis, knee    Past Surgical History:  Procedure Laterality Date   ANKLE SURGERY Left 12/07/2010   CESAREAN SECTION  12/07/2009   CESAREAN SECTION WITH BILATERAL TUBAL LIGATION Bilateral 06/15/2018   Procedure: CESAREAN SECTION WITH BILATERAL TUBAL LIGATION;  Surgeon: Lovetta Debby PARAS, MD;  Location: [facility] OR[facility];  Service: Obstetrics;  Laterality: Bilateral;   CHOLECYSTECTOMY N/A 09/27/2016   Procedure: LAPAROSCOPIC CHOLECYSTECTOMY WITH INTRAOPERATIVE CHOLANGIOGRAM;  Surgeon: Charlie FORBES Fell, MD;  Location: [facility] OR[facility];  Service: General;  Laterality: N/A;   DILATION AND CURETTAGE OF UTERUS  12/08/2011   KNEE SURGERY Right 08/05/2022   TONSILLECTOMY  12/07/2005    Allergies  Allergies  Allergen Reactions   Clindamycin/Lincomycin Anaphylaxis and Rash   Gabapentin Swelling    Leg swelling   Amoxicillin Rash    Has patient had a PCN reaction causing immediate rash, facial/tongue/throat swelling, SOB or lightheadedness with hypotension: Yes Has patient had a PCN reaction causing  severe rash involving mucus membranes or skin necrosis: No Has patient had a PCN reaction that required hospitalization: No Has patient had a PCN reaction occurring within the last 10 years: No If all of the above answers are NO, then may proceed with Cephalosporin use.     History of Present Illness      38 y.o. y/o female  Previous event monitoring in October 2022 showed predominantly sinus rhythm with an average heart rate of 93 (90-106) with rare PACs and PVCs.  There was no A-fib/flutter/SVT and only 1 triggered event which was associated with sinus rhythm.  Reassurance was provided.  She was admitted to behavioral health in January 2025 in the setting of medication overdose and reported ingesting entire bottle of medication.  She was readmitted to [facility] in April 2025 in the setting of drug overdose and encephalopathy.  Urine drug screen was positive for benzodiazepines, cocaine, cannabinoid, and tricyclics.  She was seen by psychiatry with recommendation for inpatient behavioral health admission but she was in police custody at the time and was felt to be safe for discharge to correctional facility.  In July 2025, she was seen by her primary care provider with complaint of bilateral lower extremity edema and 23 pound weight gain since March 2025.  She had been taking her mother's Lasix  at home and was subsequently prescribed Lasix , with subsequent improvement in lower extremity swelling and 17 pound weight loss in the span of a week.      Objective   Home Medications    Current Outpatient Medications  Medication Sig Dispense Refill  baclofen  (LIORESAL ) 20 MG tablet Take 1 tablet (20 mg total) by mouth 3 (three) times daily. 90 tablet 5   lamoTRIgine  (LAMICTAL ) 150 MG tablet Take 1 tablet (150 mg total) by mouth daily. 90 tablet 1   lidocaine  (LIDODERM ) 5 % Place 1 patch onto the skin daily. 30 patch 2   LORazepam  (ATIVAN ) 1 MG tablet Take 1 tablet (1 mg total) by mouth  in the morning, at noon, and at bedtime.     ondansetron  (ZOFRAN -ODT) 4 MG disintegrating tablet Take 1 tablet (4 mg total) by mouth every 8 (eight) hours as needed. 15 tablet 0   sulfamethoxazole -trimethoprim  (BACTRIM  DS) 800-160 MG tablet Take 1 tablet by mouth 2 (two) times daily. 20 tablet 0   traZODone  (DESYREL ) 150 MG tablet Take 1 tablet (150 mg total) by mouth at bedtime. 90 tablet 1   venlafaxine  XR (EFFEXOR  XR) 150 MG 24 hr capsule Take 1 capsule (150 mg total) by mouth daily with breakfast. 90 capsule 1   No current facility-administered medications for this visit.     Family History    Family History  Problem Relation Age of Onset   Arthritis Mother    Cancer Mother        skin   Hyperlipidemia Mother        takes meds   Coronary artery disease Mother        non-obstructive   Cancer Father        Lung   Anxiety disorder Sister    Depression Sister    ADD / ADHD Son    Heart attack Maternal Grandfather 32   Coronary artery disease Maternal Grandfather 60       s/p CABG   She indicated that her mother is alive. She indicated that her father is alive. She indicated that her sister is alive. She indicated that her brother is alive. She indicated that her maternal grandmother is alive. She indicated that her maternal grandfather is deceased. She indicated that her paternal grandmother is deceased. She indicated that her paternal grandfather is deceased. She indicated that both of her sons are alive.   Social History    Social History   Socioeconomic History   Marital status: Divorced    Spouse name: Not on file   Number of children: 2   Years of education: Not on file   Highest education level: Not on file  Occupational History    Employer: [employer]  Tobacco Use   Smoking status: Former    Current packs/day: 1.00    Average packs/day: 1 pack/day for 13.0 years (13.0 ttl pk-yrs)    Types: Cigarettes   Smokeless tobacco: Never  Vaping Use   Vaping  status: Every Day  Substance and Sexual Activity   Alcohol use: Yes   Drug use: Not Currently    Comment: history of heroin and marijuana use   Sexual activity: Not Currently    Birth control/protection: Surgical    Comment: Tubal Ligation  Other Topics Concern   Not on file  Social History Narrative   Divorced mother of 2 (66 & 3 - as of 09/2021)   Works @ [employer]   NO routine exercise -- active @ work   Smokes 1 PPD - not interested in quitting.   Drinks lots of coffee (drinks cold coffee most of the day)   Social Drivers of Health   Financial Resource Strain: Not on file  Food Insecurity: Patient Declined (03/16/2024)   Hunger  Vital Sign    Worried About Programme researcher, broadcasting/film/video in the Last Year: Patient declined    Ran Out of Food in the Last Year: Patient declined  Transportation Needs: Patient Declined (03/16/2024)   PRAPARE - Administrator, Civil Service (Medical): Patient declined    Lack of Transportation (Non-Medical): Patient declined  Physical Activity: Not on file  Stress: Not on file  Social Connections: Unknown (09/10/2022)   [facility]   Social Network    Social Network: Not on file  Intimate Partner Violence: Patient Declined (03/16/2024)   Humiliation, Afraid, Rape, and Kick questionnaire    Fear of Current or Ex-Partner: Patient declined    Emotionally Abused: Patient declined    Physically Abused: Patient declined    Sexually Abused: Patient declined     Review of Systems    General:  No chills, fever, night sweats or weight changes.  Cardiovascular:  No chest pain, dyspnea on exertion, edema, orthopnea, palpitations, paroxysmal nocturnal dyspnea. Dermatological: No rash, lesions/masses Respiratory: No cough, dyspnea Urologic: No hematuria, dysuria Abdominal:   No nausea, vomiting, diarrhea, bright red blood per rectum, melena, or hematemesis Neurologic:  No visual changes, wkns, changes in mental status. All other  systems reviewed and are otherwise negative except as noted above.     Physical Exam    VS:  There were no vitals taken for this visit. , BMI There is no height or weight on file to calculate BMI.        GEN: Well nourished, well developed, in no acute distress. HEENT: normal. Neck: Supple, no JVD, carotid bruits, or masses. Cardiac: RRR, no murmurs, rubs, or gallops. No clubbing, cyanosis, edema.  Radials 2+/PT 2+ and equal bilaterally.  Respiratory:  Respirations regular and unlabored, clear to auscultation bilaterally. GI: Soft, nontender, nondistended, BS + x 4. MS: no deformity or atrophy. Skin: warm and dry, no rash. Neuro:  Strength and sensation are intact. Psych: Normal affect.  Accessory Clinical Findings    ECG personally reviewed by me today-    *** - No acute changes  Lab Results  Component Value Date   WBC 5.5 06/12/2024   HGB 11.7 06/12/2024   HCT 37.1 06/12/2024   MCV 93 06/12/2024   PLT 246 06/12/2024   Lab Results  Component Value Date   CREATININE 0.78 06/12/2024   BUN 11 06/12/2024   NA 141 06/12/2024   K 4.4 06/12/2024   CL 102 06/12/2024   CO2 26 06/12/2024   Lab Results  Component Value Date   ALT 20 06/12/2024   AST 21 06/12/2024   ALKPHOS 83 06/12/2024   BILITOT 0.4 06/12/2024   Lab Results  Component Value Date   CHOL 227 (H) 08/05/2021   HDL 41 08/05/2021   LDLCALC 147 (H) 08/05/2021   TRIG 215 (H) 08/05/2021   CHOLHDL 5.5 (H) 08/05/2021    Lab Results  Component Value Date   HGBA1C 5.9 (H) 06/26/2021      Assessment & Plan   1.  ***  Lonni Meager, NP 07/12/2024, 7:51 AM

## 2024-07-26 ENCOUNTER — Ambulatory Visit: Payer: MEDICAID | Admitting: Nurse Practitioner

## 2024-07-26 NOTE — Progress Notes (Deleted)
 There were no vitals taken for this visit.   Subjective:    Patient ID: [name], female    DOB: 1986/02/05, 38 y.o.   MRN: [id number]  HPI: [name] is a 38 y.o. female  No chief complaint on file.  Patient states she started having swelling in her legs that started a week ago.  She stopped taking the flexeril  due to it saying it can cause leg swelling.  It hasn't helped with the swelling.  She has been taking her mom's lasix  and it isn't helping.  States she hasn't noticed much of a difference with the lasix .  She does wear compression socks at work.  Not drinking a lot of water.  Does not add salt to her diet.  Doesn't eat fast food. Feels like she had gained 25lbs in the last week. Has gained 23lbs since March.  Patient states she feels like it is all in the last week.  06/21/24: Swelling has improved from last week.  Patient has lost 17lbs since last visit.  She has been taking the Furosemide  and feels a lot better.  Not having and SOB.  Legs do swell after work now but resolves at night time.    Patient has a boil under left axillary area.  Has not come to a head. Very tender to touch with redness.   Relevant past medical, surgical, family and social history reviewed and updated as indicated. Interim medical history since our last visit reviewed. Allergies and medications reviewed and updated.  Review of Systems  Respiratory:  Negative for shortness of breath.   Cardiovascular:  Positive for leg swelling.  Skin:        boil    Per HPI unless specifically indicated above     Objective:    There were no vitals taken for this visit.  Wt Readings from Last 3 Encounters:  06/21/24 200 lb (90.7 kg)  06/12/24 217 lb (98.4 kg)  02/15/24 193 lb 9.6 oz (87.8 kg)    Physical Exam Vitals and nursing note reviewed.  Constitutional:      General: She is not in acute distress.    Appearance: Normal appearance. She is normal weight. She is not ill-appearing,  toxic-appearing or diaphoretic.  HENT:     Head: Normocephalic.     Right Ear: External ear normal.     Left Ear: External ear normal.     Nose: Nose normal.     Mouth/Throat:     Mouth: Mucous membranes are moist.     Pharynx: Oropharynx is clear.  Eyes:     General:        Right eye: No discharge.        Left eye: No discharge.     Extraocular Movements: Extraocular movements intact.     Conjunctiva/sclera: Conjunctivae normal.     Pupils: Pupils are equal, round, and reactive to light.  Cardiovascular:     Rate and Rhythm: Normal rate and regular rhythm.     Heart sounds: No murmur heard. Pulmonary:     Effort: Pulmonary effort is normal. No respiratory distress.     Breath sounds: Normal breath sounds. No wheezing or rales.  Musculoskeletal:     Cervical back: Normal range of motion and neck supple.  Skin:    General: Skin is warm and dry.     Capillary Refill: Capillary refill takes less than 2 seconds.      Neurological:     General: No  focal deficit present.     Mental Status: She is alert and oriented to person, place, and time. Mental status is at baseline.  Psychiatric:        Mood and Affect: Mood normal.        Behavior: Behavior normal.        Thought Content: Thought content normal.        Judgment: Judgment normal.     Results for orders placed or performed in visit on 06/12/24  Urinalysis, Routine w reflex microscopic   Collection Time: 06/12/24 10:52 AM  Result Value Ref Range   Specific Gravity, UA 1.015 1.005 - 1.030   pH, UA 7.5 5.0 - 7.5   Color, UA Yellow Yellow   Appearance Ur Clear Clear   Leukocytes,UA Negative Negative   Protein,UA Negative Negative/Trace   Glucose, UA Negative Negative   Ketones, UA Negative Negative   RBC, UA Negative Negative   Bilirubin, UA Negative Negative   Urobilinogen, Ur 2.0 (H) 0.2 - 1.0 mg/dL   Nitrite, UA Negative Negative   Microscopic Examination Comment   Comp Met (CMET)   Collection Time: 06/12/24  10:54 AM  Result Value Ref Range   Glucose 56 (L) 70 - 99 mg/dL   BUN 11 6 - 20 mg/dL   Creatinine, Ser 9.21 0.57 - 1.00 mg/dL   eGFR 899 >40 fO/fpw/8.26   BUN/Creatinine Ratio 14 9 - 23   Sodium 141 134 - 144 mmol/L   Potassium 4.4 3.5 - 5.2 mmol/L   Chloride 102 96 - 106 mmol/L   CO2 26 20 - 29 mmol/L   Calcium 8.6 (L) 8.7 - 10.2 mg/dL   Total Protein 5.8 (L) 6.0 - 8.5 g/dL   Albumin 4.0 3.9 - 4.9 g/dL   Globulin, Total 1.8 1.5 - 4.5 g/dL   Bilirubin Total 0.4 0.0 - 1.2 mg/dL   Alkaline Phosphatase 83 44 - 121 IU/L   AST 21 0 - 40 IU/L   ALT 20 0 - 32 IU/L  CBC w/Diff   Collection Time: 06/12/24 10:54 AM  Result Value Ref Range   WBC 5.5 3.4 - 10.8 x10E3/uL   RBC 3.98 3.77 - 5.28 x10E6/uL   Hemoglobin 11.7 11.1 - 15.9 g/dL   Hematocrit 62.8 65.9 - 46.6 %   MCV 93 79 - 97 fL   MCH 29.4 26.6 - 33.0 pg   MCHC 31.5 31.5 - 35.7 g/dL   RDW 86.9 88.2 - 84.5 %   Platelets 246 150 - 450 x10E3/uL   Neutrophils 50 Not Estab. %   Lymphs 39 Not Estab. %   Monocytes 8 Not Estab. %   Eos 2 Not Estab. %   Basos 1 Not Estab. %   Neutrophils Absolute 2.7 1.4 - 7.0 x10E3/uL   Lymphocytes Absolute 2.2 0.7 - 3.1 x10E3/uL   Monocytes Absolute 0.5 0.1 - 0.9 x10E3/uL   EOS (ABSOLUTE) 0.1 0.0 - 0.4 x10E3/uL   Basophils Absolute 0.0 0.0 - 0.2 x10E3/uL   Immature Granulocytes 0 Not Estab. %   Immature Grans (Abs) 0.0 0.0 - 0.1 x10E3/uL      Assessment & Plan:   Problem List Items Addressed This Visit   None     Follow up plan: No follow-ups on file.

## 2024-08-23 ENCOUNTER — Ambulatory Visit: Payer: MEDICAID | Admitting: Nurse Practitioner

## 2024-09-04 ENCOUNTER — Telehealth: Payer: Self-pay | Admitting: Nurse Practitioner

## 2024-09-04 NOTE — Telephone Encounter (Unsigned)
 Copied from CRM #[id number]. Topic: General - Other >> Sep 04, 2024 11:37 AM Donee H wrote: Reason for CRM: Patient has documents from the [facility] that needs to be signed by pcp. She states she will drop them off to day.

## 2024-09-04 NOTE — Telephone Encounter (Signed)
 Patient dropped off DMV Transportation Form to be filled out by provider. Patient is requesting a call back at [phone] within 2-5 days when completed. Document is located in providers folder.

## 2024-09-05 NOTE — Telephone Encounter (Signed)
 Pending for provider review and completion.

## 2024-09-07 NOTE — Telephone Encounter (Signed)
 Spoke with patient. She is aware her provider is not able to complete the paperwork as she does not feel patient should be released for driving. Advised she should pickup paperwork and discuss with her psych provider.

## 2024-09-28 ENCOUNTER — Other Ambulatory Visit: Payer: Self-pay | Admitting: Nurse Practitioner

## 2024-09-29 NOTE — Telephone Encounter (Signed)
 Requested Prescriptions  Pending Prescriptions Disp Refills   baclofen  (LIORESAL ) 20 MG tablet [Pharmacy Med Name: BACLOFEN  20MG  TABLETS] 90 tablet 5    Sig: TAKE 1 TABLET(20 MG) BY MOUTH THREE TIMES DAILY     Analgesics:  Muscle Relaxants - baclofen  Failed - 09/29/2024  1:59 PM      Failed - Valid encounter within last 6 months    Recent Outpatient Visits           3 months ago Borderline personality disorder [facility])   [facility] Melvin Pao, NP   3 months ago Localized swelling of left lower extremity   [facility] Melvin Pao, NP   7 months ago Borderline personality disorder [facility])   [facility] Melvin Pao, NP              Passed - Cr in normal range and within 180 days    Creatinine  Date Value Ref Range Status  04/28/2014 0.79 0.60 - 1.30 mg/dL Final   Creatinine, Ser  Date Value Ref Range Status  06/12/2024 0.78 0.57 - 1.00 mg/dL Final         Passed - eGFR is 30 or above and within 180 days    EGFR (African American)  Date Value Ref Range Status  04/28/2014 >60  Final   GFR calc Af Amer  Date Value Ref Range Status  11/22/2018 >60 >60 mL/min Final   EGFR (Non-African Amer.)  Date Value Ref Range Status  04/28/2014 >60  Final    Comment:    eGFR values <2mL/min/1.73 m2 may be an indication of chronic kidney disease (CKD). Calculated eGFR is useful in patients with stable renal function. The eGFR calculation will not be reliable in acutely ill patients when serum creatinine is changing rapidly. It is not useful in  patients on dialysis. The eGFR calculation may not be applicable to patients at the low and high extremes of body sizes, pregnant women, and vegetarians.    GFR, Estimated  Date Value Ref Range Status  03/17/2024 >60 >60 mL/min Final    Comment:    (NOTE) Calculated using the CKD-EPI Creatinine Equation (2021)    eGFR  Date Value Ref Range  Status  06/12/2024 100 >59 mL/min/1.73 Final

## 2024-10-07 ENCOUNTER — Inpatient Hospital Stay
Admission: EM | Admit: 2024-10-07 | Discharge: 2024-10-08 | DRG: 917 | Disposition: A | Discharge status: Home / Self Care | Payer: MEDICAID | Attending: Internal Medicine | Admitting: Internal Medicine

## 2024-10-07 ENCOUNTER — Emergency Department: Payer: MEDICAID

## 2024-10-07 ENCOUNTER — Other Ambulatory Visit: Payer: Self-pay

## 2024-10-07 ENCOUNTER — Encounter: Payer: Self-pay | Admitting: Oncology

## 2024-10-07 DIAGNOSIS — Z79899 Other long term (current) drug therapy: Secondary | ICD-10-CM | POA: Diagnosis not present

## 2024-10-07 DIAGNOSIS — T50904A Poisoning by unspecified drugs, medicaments and biological substances, undetermined, initial encounter: Secondary | ICD-10-CM | POA: Diagnosis not present

## 2024-10-07 DIAGNOSIS — F603 Borderline personality disorder: Secondary | ICD-10-CM | POA: Diagnosis present

## 2024-10-07 DIAGNOSIS — Z880 Allergy status to penicillin: Secondary | ICD-10-CM

## 2024-10-07 DIAGNOSIS — G928 Other toxic encephalopathy: Secondary | ICD-10-CM | POA: Diagnosis present

## 2024-10-07 DIAGNOSIS — R404 Transient alteration of awareness: Secondary | ICD-10-CM

## 2024-10-07 DIAGNOSIS — Z8632 Personal history of gestational diabetes: Secondary | ICD-10-CM | POA: Diagnosis not present

## 2024-10-07 DIAGNOSIS — F1410 Cocaine abuse, uncomplicated: Secondary | ICD-10-CM | POA: Diagnosis present

## 2024-10-07 DIAGNOSIS — Z046 Encounter for general psychiatric examination, requested by authority: Principal | ICD-10-CM

## 2024-10-07 DIAGNOSIS — E669 Obesity, unspecified: Secondary | ICD-10-CM | POA: Diagnosis not present

## 2024-10-07 DIAGNOSIS — J449 Chronic obstructive pulmonary disease, unspecified: Secondary | ICD-10-CM | POA: Diagnosis present

## 2024-10-07 DIAGNOSIS — E876 Hypokalemia: Secondary | ICD-10-CM | POA: Diagnosis present

## 2024-10-07 DIAGNOSIS — F1910 Other psychoactive substance abuse, uncomplicated: Secondary | ICD-10-CM | POA: Diagnosis present

## 2024-10-07 DIAGNOSIS — F419 Anxiety disorder, unspecified: Secondary | ICD-10-CM | POA: Diagnosis present

## 2024-10-07 DIAGNOSIS — F319 Bipolar disorder, unspecified: Secondary | ICD-10-CM | POA: Diagnosis present

## 2024-10-07 DIAGNOSIS — N390 Urinary tract infection, site not specified: Secondary | ICD-10-CM | POA: Diagnosis present

## 2024-10-07 DIAGNOSIS — Z83438 Family history of other disorder of lipoprotein metabolism and other lipidemia: Secondary | ICD-10-CM

## 2024-10-07 DIAGNOSIS — F418 Other specified anxiety disorders: Secondary | ICD-10-CM | POA: Diagnosis not present

## 2024-10-07 DIAGNOSIS — Z6830 Body mass index (BMI) 30.0-30.9, adult: Secondary | ICD-10-CM | POA: Diagnosis not present

## 2024-10-07 DIAGNOSIS — Z818 Family history of other mental and behavioral disorders: Secondary | ICD-10-CM | POA: Diagnosis not present

## 2024-10-07 DIAGNOSIS — Z8261 Family history of arthritis: Secondary | ICD-10-CM | POA: Diagnosis not present

## 2024-10-07 DIAGNOSIS — Z881 Allergy status to other antibiotic agents status: Secondary | ICD-10-CM

## 2024-10-07 DIAGNOSIS — T50901A Poisoning by unspecified drugs, medicaments and biological substances, accidental (unintentional), initial encounter: Principal | ICD-10-CM | POA: Diagnosis present

## 2024-10-07 DIAGNOSIS — E66811 Obesity, class 1: Secondary | ICD-10-CM | POA: Diagnosis present

## 2024-10-07 DIAGNOSIS — Z888 Allergy status to other drugs, medicaments and biological substances status: Secondary | ICD-10-CM

## 2024-10-07 DIAGNOSIS — T50902A Poisoning by unspecified drugs, medicaments and biological substances, intentional self-harm, initial encounter: Secondary | ICD-10-CM | POA: Diagnosis not present

## 2024-10-07 DIAGNOSIS — Z9049 Acquired absence of other specified parts of digestive tract: Secondary | ICD-10-CM | POA: Diagnosis not present

## 2024-10-07 DIAGNOSIS — R7303 Prediabetes: Secondary | ICD-10-CM | POA: Diagnosis present

## 2024-10-07 DIAGNOSIS — M1710 Unilateral primary osteoarthritis, unspecified knee: Secondary | ICD-10-CM | POA: Diagnosis present

## 2024-10-07 DIAGNOSIS — Y929 Unspecified place or not applicable: Secondary | ICD-10-CM | POA: Diagnosis not present

## 2024-10-07 DIAGNOSIS — F17200 Nicotine dependence, unspecified, uncomplicated: Secondary | ICD-10-CM | POA: Diagnosis present

## 2024-10-07 DIAGNOSIS — Z8249 Family history of ischemic heart disease and other diseases of the circulatory system: Secondary | ICD-10-CM | POA: Diagnosis not present

## 2024-10-07 LAB — COMPREHENSIVE METABOLIC PANEL WITH GFR
ALT: 18 U/L (ref 0–44)
AST: 19 U/L (ref 15–41)
Albumin: 3.9 g/dL (ref 3.5–5.0)
Alkaline Phosphatase: 53 U/L (ref 38–126)
Anion gap: 13 (ref 5–15)
BUN: 11 mg/dL (ref 6–20)
CO2: 23 mmol/L (ref 22–32)
Calcium: 8.8 mg/dL — ABNORMAL LOW (ref 8.9–10.3)
Chloride: 107 mmol/L (ref 98–111)
Creatinine, Ser: 0.86 mg/dL (ref 0.44–1.00)
GFR, Estimated: >60 mL/min (ref >60)
Glucose, Bld: 99 mg/dL (ref 70–99)
Potassium: 3.4 mmol/L — ABNORMAL LOW (ref 3.5–5.1)
Sodium: 143 mmol/L (ref 135–145)
Total Bilirubin: 1.4 mg/dL — ABNORMAL HIGH (ref 0.0–1.2)
Total Protein: 6.6 g/dL (ref 6.5–8.1)

## 2024-10-07 LAB — BLOOD GAS, VENOUS
Acid-Base Excess: 1.9 mmol/L (ref 0.0–2.0)
Bicarbonate: 29 mmol/L — ABNORMAL HIGH (ref 20.0–28.0)
O2 Saturation: 72.8 %
Patient temperature: 37
pCO2, Ven: 55 mmHg (ref 44–60)
pH, Ven: 7.33 (ref 7.25–7.43)
pO2, Ven: 44 mmHg (ref 32–45)

## 2024-10-07 LAB — ETHANOL: Alcohol, Ethyl (B): <15 mg/dL (ref <15)

## 2024-10-07 LAB — CBC
HCT: 38.7 % (ref 36.0–46.0)
Hemoglobin: 13 g/dL (ref 12.0–15.0)
MCH: 28.7 pg (ref 26.0–34.0)
MCHC: 33.6 g/dL (ref 30.0–36.0)
MCV: 85.4 fL (ref 80.0–100.0)
Platelets: 172 K/uL (ref 150–400)
RBC: 4.53 MIL/uL (ref 3.87–5.11)
RDW: 14 % (ref 11.5–15.5)
WBC: 6.3 K/uL (ref 4.0–10.5)
nRBC: 0 % (ref 0.0–0.2)

## 2024-10-07 LAB — PHOSPHORUS: Phosphorus: 4.2 mg/dL (ref 2.5–4.6)

## 2024-10-07 LAB — ACETAMINOPHEN LEVEL: Acetaminophen (Tylenol), Serum: <10 ug/mL — ABNORMAL LOW (ref 10–30)

## 2024-10-07 LAB — CBG MONITORING, ED: Glucose-Capillary: 112 mg/dL — ABNORMAL HIGH (ref 70–99)

## 2024-10-07 LAB — HCG, QUANTITATIVE, PREGNANCY: hCG, Beta Chain, Quant, S: 1 m[IU]/mL (ref <5)

## 2024-10-07 LAB — MAGNESIUM: Magnesium: 2.2 mg/dL (ref 1.7–2.4)

## 2024-10-07 LAB — SALICYLATE LEVEL: Salicylate Lvl: <7 mg/dL — ABNORMAL LOW (ref 7.0–30.0)

## 2024-10-07 MED ORDER — SODIUM CHLORIDE 0.9 % IV SOLN: INTRAVENOUS | Status: DC

## 2024-10-07 MED ORDER — DROPERIDOL 2.5 MG/ML IJ SOLN
2.5000 mg | Freq: Once | INTRAMUSCULAR | Status: AC
  Administered 2024-10-07: 2.5 mg via INTRAMUSCULAR

## 2024-10-07 MED ORDER — SODIUM CHLORIDE 0.9 % IV BOLUS
1000.0000 mL | Freq: Once | INTRAVENOUS | Status: AC
  Administered 2024-10-07: 1000 mL via INTRAVENOUS

## 2024-10-07 MED ORDER — ACETAMINOPHEN 325 MG RE SUPP: 650.0000 mg | Freq: Four times a day (QID) | RECTAL | Status: DC | PRN

## 2024-10-07 MED ORDER — OLANZAPINE 10 MG IM SOLR: 2.5000 mg | Freq: Once | INTRAMUSCULAR | Status: DC | PRN

## 2024-10-07 MED ORDER — FENTANYL 2500MCG IN NS 250ML (10MCG/ML) PREMIX INFUSION: 0.0000 ug/h | INTRAVENOUS | Status: DC

## 2024-10-07 MED ORDER — MIDAZOLAM HCL (PF) 2 MG/2ML IJ SOLN
2.0000 mg | Freq: Once | INTRAMUSCULAR | Status: AC
  Administered 2024-10-07: 2 mg via INTRAVENOUS
  Filled 2024-10-07: qty 2

## 2024-10-07 MED ORDER — ALBUTEROL SULFATE (2.5 MG/3ML) 0.083% IN NEBU: 2.5000 mg | INHALATION_SOLUTION | RESPIRATORY_TRACT | Status: DC | PRN

## 2024-10-07 MED ORDER — ENOXAPARIN SODIUM 40 MG/0.4ML IJ SOSY
40.0000 mg | PREFILLED_SYRINGE | INTRAMUSCULAR | Status: DC
  Administered 2024-10-07: 40 mg via SUBCUTANEOUS
  Filled 2024-10-07: qty 0.4

## 2024-10-07 MED ORDER — NALOXONE HCL 2 MG/2ML IJ SOSY
2.0000 mg | PREFILLED_SYRINGE | INTRAMUSCULAR | Status: AC
  Administered 2024-10-07: 2 mg via INTRAVENOUS
  Filled 2024-10-07: qty 2

## 2024-10-07 MED ORDER — NALOXONE HCL 2 MG/2ML IJ SOSY
2.0000 mg | PREFILLED_SYRINGE | INTRAMUSCULAR | Status: AC | PRN
  Administered 2024-10-07: 2 mg via INTRAVENOUS
  Filled 2024-10-07: qty 2

## 2024-10-07 MED ORDER — ONDANSETRON HCL 4 MG/2ML IJ SOLN: 4.0000 mg | Freq: Three times a day (TID) | INTRAMUSCULAR | Status: DC | PRN

## 2024-10-07 MED ORDER — POTASSIUM CHLORIDE 10 MEQ/100ML IV SOLN
10.0000 meq | INTRAVENOUS | Status: AC
  Administered 2024-10-07 (×2): 10 meq via INTRAVENOUS
  Filled 2024-10-07 (×2): qty 100

## 2024-10-07 MED ORDER — ROCURONIUM BROMIDE 10 MG/ML (PF) SYRINGE
1.0000 mg/kg | PREFILLED_SYRINGE | Freq: Once | INTRAVENOUS | Status: DC
  Filled 2024-10-07: qty 10

## 2024-10-07 MED ORDER — KETAMINE HCL 50 MG/5ML IJ SOSY
1.0000 mg/kg | PREFILLED_SYRINGE | Freq: Once | INTRAMUSCULAR | Status: DC
  Filled 2024-10-07: qty 10

## 2024-10-07 MED ORDER — MIDAZOLAM-SODIUM CHLORIDE 100-0.9 MG/100ML-% IV SOLN: 0.5000 mg/h | INTRAVENOUS | Status: DC

## 2024-10-07 NOTE — ED Triage Notes (Signed)
 Pt BIB [facility] from [facility] due to a possible overdose. Pt was being transferd to jail after being arrested for intoxication. Officer at bedside reports pt was speaking Erratically  and then became unresponsive in the back  of the patrol car. EMS gave 2mg  Narcan  and sternal rubs. Vitals WDL.  133/88 97% RA 17 RR

## 2024-10-07 NOTE — ED Provider Notes (Signed)
 [facility] Provider Note    Event Date/Time   First MD Initiated Contact with Patient 10/07/24 1744     (approximate)   History   Drug Overdose  EM caveat unresponsive  HPI  [name] is a 38 y.o. female     Evidently went unresponsive with [employer].        Physical Exam   Triage Vital Signs: ED Triage Vitals  Encounter Vitals Group     BP 10/07/24 1738 127/85     Girls Systolic BP Percentile --      Girls Diastolic BP Percentile --      Boys Systolic BP Percentile --      Boys Diastolic BP Percentile --      Pulse Rate 10/07/24 1738 71     Resp 10/07/24 1738 14     Temp --      Temp src --      SpO2 10/07/24 1738 98 %     Weight 10/07/24 1739 199 lb 15.3 oz (90.7 kg)     Height 10/07/24 1739 5' 4 (1.626 m)     Head Circumference --      Peak Flow --      Pain Score 10/07/24 1739 Asleep     Pain Loc --      Pain Education --      Exclude from Growth Chart --     Most recent vital signs: Vitals:   10/07/24 2233 10/08/24 0012  BP: (!) 141/85   Pulse: (!) 53   Resp: 12   Temp:  97.6 F (36.4 C)  SpO2: 100%      General: Unresponsive to all stimuli with exception to sternal rub Normocephalic atraumatic.  Even unlabored respirations and appears in no acute distress CV:  Good peripheral perfusion.  Normal tones Resp:  Normal effort.  Clear bilateral with normal respiratory rate as well as excursion.  Resting flaccidly but notably unresponsive Abd:  No distention.  Soft nontender Other:  No tremulousness.  Flaccid in all extremities.  Nursing discovered patient had a glass pipe in the gluteal cleft, packet of white powder tucked into the vagina. [Nursing placed these into bag and urine cup, notified [employer] again]   ED Results / Procedures / Treatments   Labs (all labs ordered are listed, but only abnormal results are displayed) Labs Reviewed  COMPREHENSIVE METABOLIC PANEL WITH GFR - Abnormal;  Notable for the following components:      Result Value   Potassium 3.4 (*)    Calcium 8.8 (*)    Total Bilirubin 1.4 (*)    All other components within normal limits  SALICYLATE LEVEL - Abnormal; Notable for the following components:   Salicylate Lvl <7.0 (*)    All other components within normal limits  ACETAMINOPHEN  LEVEL - Abnormal; Notable for the following components:   Acetaminophen  (Tylenol ), Serum <10 (*)    All other components within normal limits  BLOOD GAS, VENOUS - Abnormal; Notable for the following components:   Bicarbonate 29.0 (*)    All other components within normal limits  CBG MONITORING, ED - Abnormal; Notable for the following components:   Glucose-Capillary 112 (*)    All other components within normal limits  CBC  ETHANOL  HCG, QUANTITATIVE, PREGNANCY  MAGNESIUM   PHOSPHORUS  LAMOTRIGINE  LEVEL  URINALYSIS, ROUTINE W REFLEX MICROSCOPIC  URINE DRUG SCREEN, QUALITATIVE ([facility] ONLY)  BASIC METABOLIC PANEL WITH GFR  CBC     EKG  And interpreted by me at 1740 heart rate 75 QRS 110 QTc 460 Normal sinus rhythm, probable mild early repolarization.  No evidence of obvious frank ischemia.  QT is appropriate no terminal R wave elevation in aVR.  No obvious toxic component noted on this ECG  Morphology appears quite similar to ECG from January 7 of this year  RADIOLOGY  CT Cervical Spine Wo Contrast Result Date: 10/07/2024 EXAM: CT CERVICAL SPINE WITHOUT CONTRAST 10/07/2024 07:11:49 PM TECHNIQUE: CT of the cervical spine was performed without the administration of intravenous contrast. Multiplanar reformatted images are provided for review. Automated exposure control, iterative reconstruction, and/or weight based adjustment of the mA/kV was utilized to reduce the radiation dose to as low as reasonably achievable. COMPARISON: 11/01/2023 CLINICAL HISTORY: Cervical radiculopathy, no red flags; Patient presenting with unresponsiveness, etiology unknown. FINDINGS:  CERVICAL SPINE: BONES AND ALIGNMENT: No acute fracture or traumatic malalignment. DEGENERATIVE CHANGES: No significant degenerative changes. SOFT TISSUES: No prevertebral soft tissue swelling. IMPRESSION: 1. No acute abnormality of the cervical spine. Electronically signed by: Franky Crease MD 10/07/2024 07:32 PM EDT RP Workstation: [id number]   CT Head Wo Contrast Result Date: 10/07/2024 EXAM: CT HEAD WITHOUT CONTRAST 10/07/2024 07:11:49 PM TECHNIQUE: CT of the head was performed without the administration of intravenous contrast. Automated exposure control, iterative reconstruction, and/or weight based adjustment of the mA/kV was utilized to reduce the radiation dose to as low as reasonably achievable. COMPARISON: 03/16/2024 CLINICAL HISTORY: Delirium FINDINGS: BRAIN AND VENTRICLES: No acute hemorrhage. No evidence of acute infarct. No hydrocephalus. No extra-axial collection. No mass effect or midline shift. ORBITS: No acute abnormality. SINUSES: No acute abnormality. SOFT TISSUES AND SKULL: No acute soft tissue abnormality. No skull fracture. IMPRESSION: 1. No acute intracranial abnormality. Electronically signed by: Franky Crease MD 10/07/2024 07:31 PM EDT RP Workstation: [id number]   DG Chest Portable 1 View Result Date: 10/07/2024 CLINICAL DATA:  Unresponsive, intubation EXAM: PORTABLE CHEST 1 VIEW COMPARISON:  02/04/2022 FINDINGS: Single frontal view of the chest demonstrates an unremarkable cardiac silhouette. No airspace disease, effusion, or pneumothorax. There is no evidence of endotracheal tube on this exam. No acute bony abnormalities. IMPRESSION: 1. No acute intrathoracic process. 2. Endotracheal tube, if present, is not identified on this study. Electronically Signed   By: Ozell Daring M.D.   On: 10/07/2024 19:04      PROCEDURES:  Critical Care performed: Yes, see critical care procedure note(s)  CRITICAL CARE Performed by: Oneil Budge   Total critical care time: 35  minutes  Critical care time was exclusive of separately billable procedures and treating other patients.  Critical care was necessary to treat or prevent imminent or life-threatening deterioration.  Critical care was time spent personally by me on the following activities: development of treatment plan with patient and/or surrogate as well as nursing, discussions with consultants, evaluation of patient's response to treatment, examination of patient, obtaining history from patient or surrogate, ordering and performing treatments and interventions, ordering and review of laboratory studies, ordering and review of radiographic studies, pulse oximetry and re-evaluation of patient's condition.  Procedures   MEDICATIONS ORDERED IN ED: Medications  0.9 %  sodium chloride  infusion ( Intravenous New Bag/Given 10/07/24 1953)  albuterol  (PROVENTIL ) (2.5 MG/3ML) 0.083% nebulizer solution 2.5 mg (has no administration in time range)  ondansetron  (ZOFRAN ) injection 4 mg (has no administration in time range)  acetaminophen  (TYLENOL ) suppository 650 mg (has no administration in time range)  enoxaparin  (LOVENOX ) injection 40 mg (40 mg Subcutaneous Given 10/07/24 2340)  OLANZapine (  ZYPREXA) injection 2.5 mg (has no administration in time range)  naloxone  (NARCAN ) injection 2 mg (2 mg Intravenous Given 10/07/24 1754)  naloxone  (NARCAN ) injection 2 mg (2 mg Intravenous Given 10/07/24 1801)  sodium chloride  0.9 % bolus 1,000 mL (0 mLs Intravenous Stopped 10/08/24 0004)  potassium chloride 10 mEq in 100 mL IVPB (0 mEq Intravenous Stopped 10/07/24 2337)  droperidol (INAPSINE) 2.5 MG/ML injection 2.5 mg (2.5 mg Intramuscular Given 10/07/24 2003)  droperidol (INAPSINE) 2.5 MG/ML injection 2.5 mg (2.5 mg Intramuscular Given 10/07/24 2039)  midazolam  PF (VERSED ) injection 2 mg (2 mg Intravenous Given 10/07/24 2040)     IMPRESSION / MDM / ASSESSMENT AND PLAN / ED COURSE  I reviewed the triage vital signs and the nursing  notes.                              Differential diagnosis includes, but is not limited to, given the setting and report by prehospital, high suspicion for an intentional overdose.  Patient is now completely unresponsive except slight movements to noxious stimuli.  After observation mental status improving, patient coughing, clearing airway, moves and withdraws to discomfort and is protecting the airway much better as of about 6:15 PM.  Initially had planned to perform intubation, but ultimately did not require as mental status improving.  Initial lab tests reveal potassium 3.4.  Hemoglobin normal CBC normal.  Initial acetaminophen  and salicylate levels are normal.  Not pregnant.  CT head and cervical spine interpreted, negative for obvious acute injury  Low probability of trauma.  Based on the presentation as well as review of patient's previous clinical history high concern for intentional overdose or medication/drug effect is notable.  There is no report of trauma and the patient was evidently pulled over due to abnormal irregular driving type activities etc.  Patient's presentation is most consistent with acute presentation with potential threat to life or bodily function.   The patient is on the cardiac monitor to evaluate for evidence of arrhythmia and/or significant heart rate changes.   Clinical Course as of 10/08/24 0024  Sat Oct 07, 2024  1806 Patient given 4 mg of naloxone  to this point, remains completely unresponsive.  No gag reflex.  Still respirating evenly.  At this juncture I felt the patient is not protecting her airway sufficiently.  Further workup pending.  Awaiting labs, also looking at medications potential overdoses baclofen  etc. would be considerations as well as potential concern for illicit substances.  Not showing any improvement with treatment with naloxone   [MQ]  1826 No was preparing for intubation, but patient now responding to tactile stimuli.  Patient able to  cough, clear his throat, opens eyes briefly.  Responds to stimuli at this time and is protecting airway much better.  Responsiveness seems to have improved markedly.  Will continue observe closely and continue workup but at this point patient guarding and protecting airway appropriately.  Patient not requiring intubation at this time [MQ]  1826 I will place the patient under involuntary commitment at this point, high concern for a potential intentional overdose given the situation.  Additionally the patient was found to have a powdery substance in a baggy in her vaginal canal on exam, as well as a glass pipe in her gluteal cleft [MQ]  1826 Glass pipe [MQ]  1904 Consulted with Dr. Hilma.  He will see and evaluate the patient, advises if any concerns he will also consult ICU team.  Patient is showing improvement in mental status at this point and I certainly discussed with our hospitalist I think stepdown would be an appropriate setting, if the patient shows any further decline in mental status then potentially would require intubation mechanical ventilation, but at this point is showing improvement in mental status. [MQ]  1904 CT head pending [MQ]  2030 Patient becoming extremely agitated.  Received intramuscular droperidol earlier as she was becoming agitated, but at this time agitation is still progressing.  She is interfering with her attempts at care, thrashing about, agitated with staff.  Will give additional droperidol and midazolam  at this time due to degree of agitation.  Nursing advises the inpatient team is also aware [MQ]    Clinical Course User Index [MQ] Dicky Anes, MD     FINAL CLINICAL IMPRESSION(S) / ED DIAGNOSES   Final diagnoses:  Involuntary commitment  Unresponsive episode     Rx / DC Orders   ED Discharge Orders     None        Note:  This document was prepared using Dragon voice recognition software and may include unintentional dictation errors.   Dicky Anes,  MD 10/08/24 BEATRIS

## 2024-10-07 NOTE — ED Notes (Addendum)
 Pt becoming increasingly combative. Pulling at lines and moving erratically in the bed. Medications ordered for patient and staff safety.

## 2024-10-07 NOTE — ED Notes (Signed)
 This NT was assigned to sit with the pt for safety. Pt is now asleep and non-combative . Falls bundle is in place.

## 2024-10-07 NOTE — ED Notes (Signed)
 Fall risk bundle initiated.

## 2024-10-07 NOTE — ED Notes (Signed)
 Attempted to call CCMD x2 with no answer.

## 2024-10-07 NOTE — H&P (Signed)
 History and Physical    MARVELOUS WOOLFORD FMW:[id number] DOB: [birth year] DOA: 10/07/2024  Referring MD/NP/PA:   PCP: Melvin Pao, NP   Patient coming from:  The patient is coming from home.     Chief Complaint:   HPI: [name] is a 38 y.o. female with medical history significant of      Data reviewed independently and ED Course: pt was found to have     ***       EKG: I have personally reviewed.  Not done in ED, will get one.   ***   Review of Systems:   General: no fevers, chills, no body weight gain, has poor appetite, has fatigue HEENT: no blurry vision, hearing changes or sore throat Respiratory: no dyspnea, coughing, wheezing CV: no chest pain, no palpitations GI: no nausea, vomiting, abdominal pain, diarrhea, constipation GU: no dysuria, burning on urination, increased urinary frequency, hematuria  Ext: no leg edema Neuro: no unilateral weakness, numbness, or tingling, no vision change or hearing loss Skin: no rash, no skin tear. MSK: No muscle spasm, no deformity, no limitation of range of movement in spin Heme: No easy bruising.  Travel history: No recent long distant travel.   Allergy:  Allergies  Allergen Reactions   Clindamycin/Lincomycin Anaphylaxis and Rash   Gabapentin Swelling    Leg swelling   Clindamycin    Amoxicillin Rash    Has patient had a PCN reaction causing immediate rash, facial/tongue/throat swelling, SOB or lightheadedness with hypotension: Yes Has patient had a PCN reaction causing severe rash involving mucus membranes or skin necrosis: No Has patient had a PCN reaction that required hospitalization: No Has patient had a PCN reaction occurring within the last 10 years: No If all of the above answers are NO, then may proceed with Cephalosporin use.     Past Medical History:  Diagnosis Date   Borderline personality disorder (HCC) 04/16/2013   Cannabis abuse 2014   Chondromalacia patellae, right knee    Depression     Well-controlled with Zoloft    Gestational diabetes    Has prediabetes now.   Headache    Heroin abuse (HCC)    History of drug use 12/15/2017   IDA (iron deficiency anemia)    Migraines    Morbid obesity with BMI of 40.0-44.9, adult (HCC)    Nicotine  dependence with current use 06/26/2021   Osteoarthritis, knee     Past Surgical History:  Procedure Laterality Date   ANKLE SURGERY Left 12/07/2010   CESAREAN SECTION  12/07/2009   CESAREAN SECTION WITH BILATERAL TUBAL LIGATION Bilateral 06/15/2018   Procedure: CESAREAN SECTION WITH BILATERAL TUBAL LIGATION;  Surgeon: Lovetta Debby PARAS, MD;  Location: [facility] ORS;  Service: Obstetrics;  Laterality: Bilateral;   CHOLECYSTECTOMY N/A 09/27/2016   Procedure: LAPAROSCOPIC CHOLECYSTECTOMY WITH INTRAOPERATIVE CHOLANGIOGRAM;  Surgeon: Charlie FORBES Fell, MD;  Location: [facility] ORS;  Service: General;  Laterality: N/A;   DILATION AND CURETTAGE OF UTERUS  12/08/2011   KNEE SURGERY Right 08/05/2022   TONSILLECTOMY  12/07/2005    Social History:  reports that she has quit smoking. Her smoking use included cigarettes. She has a 13 pack-year smoking history. She has never used smokeless tobacco. She reports current alcohol use. She reports that she does not currently use drugs.  Family History:  Family History  Problem Relation Age of Onset   Arthritis Mother    Cancer Mother        skin   Hyperlipidemia Mother  takes meds   Coronary artery disease Mother        non-obstructive   Cancer Father        Lung   Anxiety disorder Sister    Depression Sister    ADD / ADHD Son    Heart attack Maternal Grandfather 32   Coronary artery disease Maternal Grandfather 85       s/p CABG     Prior to Admission medications   Medication Sig Start Date End Date Taking? Authorizing Provider  cephALEXin  (KEFLEX ) 500 MG capsule Take 500 mg by mouth every 12 (twelve) hours. 09/10/24  Yes [provider]  VALIUM 10 MG tablet Take 10 mg by  mouth every 8 (eight) hours as needed for anxiety. 09/13/24  Yes [provider]  ADDERALL 15 MG tablet Take 1 tablet by mouth 2 (two) times daily.    [provider]  ADDERALL XR 15 MG 24 hr capsule Take 15 mg by mouth every morning.    [provider]  baclofen  (LIORESAL ) 20 MG tablet TAKE 1 TABLET(20 MG) BY MOUTH THREE TIMES DAILY 09/29/24   Melvin Pao, NP  lamoTRIgine  (LAMICTAL ) 150 MG tablet Take 1 tablet (150 mg total) by mouth daily. 06/21/24   Melvin Pao, NP  lidocaine  (LIDODERM ) 5 % Place 1 patch onto the skin daily. 06/21/24   Melvin Pao, NP  LORazepam  (ATIVAN ) 1 MG tablet Take 1 tablet (1 mg total) by mouth in the morning, at noon, and at bedtime. 02/15/24   Melvin Pao, NP  ondansetron  (ZOFRAN -ODT) 4 MG disintegrating tablet Take 1 tablet (4 mg total) by mouth every 8 (eight) hours as needed. 01/22/24   Arvis Huxley B, PA-C  sulfamethoxazole -trimethoprim  (BACTRIM  DS) 800-160 MG tablet Take 1 tablet by mouth 2 (two) times daily. 06/21/24   Melvin Pao, NP  traZODone  (DESYREL ) 150 MG tablet Take 1 tablet (150 mg total) by mouth at bedtime. 06/21/24   Melvin Pao, NP  venlafaxine  XR (EFFEXOR  XR) 150 MG 24 hr capsule Take 1 capsule (150 mg total) by mouth daily with breakfast. 06/21/24   Melvin Pao, NP    Physical Exam: Vitals:   10/07/24 1806 10/07/24 1817 10/07/24 1818 10/07/24 1819  BP:      Pulse:  60 62 62  Resp:  12 15 15   SpO2:  100% 100% 100%  Weight: 80 kg     Height:       General: Not in acute distress HEENT:       Eyes: PERRL, EOMI, no jaundice       ENT: No discharge from the ears and nose, no pharynx injection, no tonsillar enlargement.        Neck: No JVD, no bruit, no mass felt. Heme: No neck lymph node enlargement. Cardiac: S1/S2, RRR, No murmurs, No gallops or rubs. Respiratory: No rales, wheezing, rhonchi or rubs. GI: Soft, nondistended, nontender, no rebound pain, no organomegaly, BS  present. GU: No hematuria Ext: No pitting leg edema bilaterally. 1+DP/PT pulse bilaterally. Musculoskeletal: No joint deformities, No joint redness or warmth, no limitation of ROM in spin. Skin: No rashes.  Neuro: Alert, oriented X3, cranial nerves II-XII grossly intact, moves all extremities normally. Muscle strength 5/5 in all extremities, sensation to light touch intact. Brachial reflex 2+ bilaterally. Knee reflex 1+ bilaterally. Negative Babinski's sign. Normal finger to nose test. Psych: Patient is not psychotic, no suicidal or hemocidal ideation.  Labs on Admission: I have personally reviewed following labs and imaging studies  CBC: Recent  Labs  Lab 10/07/24 1745  WBC 6.3  HGB 13.0  HCT 38.7  MCV 85.4  PLT 172   Basic Metabolic Panel: Recent Labs  Lab 10/07/24 1745  NA 143  K 3.4*  CL 107  CO2 23  GLUCOSE 99  BUN 11  CREATININE 0.86  CALCIUM 8.8*   GFR: Estimated Creatinine Clearance: 90.7 mL/min (by C-G formula based on SCr of 0.86 mg/dL). Liver Function Tests: Recent Labs  Lab 10/07/24 1745  AST 19  ALT 18  ALKPHOS 53  BILITOT 1.4*  PROT 6.6  ALBUMIN 3.9   No results for input(s): LIPASE, AMYLASE in the last 168 hours. No results for input(s): AMMONIA  in the last 168 hours. Coagulation Profile: No results for input(s): INR, PROTIME in the last 168 hours. Cardiac Enzymes: No results for input(s): CKTOTAL, CKMB, CKMBINDEX, TROPONINI in the last 168 hours. BNP (last 3 results) No results for input(s): PROBNP in the last 8760 hours. HbA1C: No results for input(s): HGBA1C in the last 72 hours. CBG: Recent Labs  Lab 10/07/24 1754  GLUCAP 112*   Lipid Profile: No results for input(s): CHOL, HDL, LDLCALC, TRIG, CHOLHDL, LDLDIRECT in the last 72 hours. Thyroid  Function Tests: No results for input(s): TSH, T4TOTAL, FREET4, T3FREE, THYROIDAB in the last 72 hours. Anemia Panel: No results for input(s):  VITAMINB12, FOLATE, FERRITIN, TIBC, IRON, RETICCTPCT in the last 72 hours. Urine analysis:    Component Value Date/Time   COLORURINE YELLOW (A) 03/16/2024 0500   APPEARANCEUR Clear 06/12/2024 1052   LABSPEC 1.019 03/16/2024 0500   LABSPEC 1.017 04/28/2014 1724   PHURINE 6.0 03/16/2024 0500   GLUCOSEU Negative 06/12/2024 1052   GLUCOSEU Negative 04/28/2014 1724   HGBUR NEGATIVE 03/16/2024 0500   BILIRUBINUR Negative 06/12/2024 1052   BILIRUBINUR Negative 04/28/2014 1724   KETONESUR 20 (A) 03/16/2024 0500   PROTEINUR Negative 06/12/2024 1052   PROTEINUR NEGATIVE 03/16/2024 0500   NITRITE Negative 06/12/2024 1052   NITRITE NEGATIVE 03/16/2024 0500   LEUKOCYTESUR Negative 06/12/2024 1052   LEUKOCYTESUR NEGATIVE 03/16/2024 0500   LEUKOCYTESUR Negative 04/28/2014 1724   Sepsis Labs: @LABRCNTIP (procalcitonin:4,lacticidven:4) )No results found for this or any previous visit (from the past 240 hours).   Radiological Exams on Admission:   Assessment/Plan Active Problems:   * No active hospital problems. *   Assessment and Plan: No notes have been filed under this hospital service. Service: Hospitalist      Active Problems:   * No active hospital problems. *    DVT ppx: SQ Heparin          SQ Lovenox   Code Status: Full code   ***  Family Communication:     not done, no family member is at bed side.              Yes, patient's    at bed side.       by phone   ***  Disposition Plan:  Anticipate discharge back to previous environment  Consults called:    Admission status and Level of care: :    for obs as inpt        Dispo: The patient is from: {From:23814}              Anticipated d/c is to: {To:23815}              Anticipated d/c date is: {Days:23816}              Patient currently {Medically stable:23817}    Severity of  Illness:  {Observation/Inpatient:21159}       Date of Service 10/07/2024    Caleb Exon [facility]   If  7PM-7AM, please contact night-coverage [email or web address] 10/07/2024, 6:56 PM

## 2024-10-07 NOTE — ED Notes (Signed)
 While undressing patient white substance in clear baggy found in between patient's labia and glass cylinder pipe (broken in two pieces) found in patient's buttocks. MD aware. Charge RN called [employer] to make highway patrol aware.

## 2024-10-07 NOTE — ED Notes (Signed)
 Pt responsive at bedside when about to intubate. Pt started coughing and is responsive to pain. Per Dr. Barbarann at bedside d/c meds and no intubation.

## 2024-10-07 NOTE — ED Notes (Signed)
 Pt continues to thrash in bed every 30 seconds to 1 min. During periods of alertness, pt is able to speak and say it hurts or stop but not responding to questions and not-redirectable. MD made aware. Pt noted to have dislodged her left peripheral IV with K+ infusing. Pharmacy and MD notified. Aspirated and removed PIV and warm compress applied per instructions. Pulses present distally

## 2024-10-07 NOTE — H&P (Incomplete)
 History and Physical    [name] FMW:[id number] DOB: 1986-05-15 DOA: 10/07/2024  Referring MD/NP/PA:   PCP: Melvin Pao, NP   Patient coming from:  The patient is coming from [place]     Chief Complaint: Altered mental status  HPI: [name] is a 38 y.o. female with medical history significant of polysubstance abuse (cocaine, heroin, marijuana), benzo overdose, COPD, former smoker, depression, bipolar, borderline personality disorder, who presents with altered mental status.  Patient has AMS, and is unable to provide accurate medical history, therefore, most of the history is obtained by discussing the case with ED physician, per EMS report, and with the nursing staff.  Per report, pt is from [facility] due to possible overdose.  Officer at bedside reported that pt was speaking Erratically and then became unresponsive in the back of the patrol car when pt was being transferd to jail after being arrested for intoxication. EMS gave 2mg  Narcan  and sternal rubs, no improvement.  Patient was given Narcan  2 mg x 2 in ED without improvement. Per RN report, while undressing patient white substance in clear baggy found in between patient's labia and glass cylinder pipe (broken in two pieces) found in patient's buttocks. Per EDP, when about to intubate the patient, she started coughing and responsive to pain stimuli, so no intubation was performed.  When I saw pt in Ed, she is confused, combative, moving erratically in bed.  No active respiratory distress, cough noted.  No active nausea vomiting, diarrhea noted.  Not sure if patient has chest pain or abdominal pain.  Data reviewed independently and ED Course: pt was found to have WBC 6.3, GFR> 60, potassium 3.4, magnesium  2.2, phosphorus of 4.2, Tylenol  level less than 10, salicylate level less than 7, alcohol level less than 15.  CT of head negative.  CT of C-spine negative for acute injury.  VBG with pH 7.33, CO2 55 and O2 44.   Patient is admitted to stepdown as inpatient.  Patient is IVC 8.   EKG: I have personally reviewed.  Sinus rhythm, QTc 446, T wave inversion in V1-V2, low voltage.   Review of Systems: Could not be reviewed due to altered mental status.   Allergy:  Allergies  Allergen Reactions  . Clindamycin/Lincomycin Anaphylaxis and Rash  . Gabapentin Swelling    Leg swelling  . Clindamycin   . Amoxicillin Rash    Has patient had a PCN reaction causing immediate rash, facial/tongue/throat swelling, SOB or lightheadedness with hypotension: Yes Has patient had a PCN reaction causing severe rash involving mucus membranes or skin necrosis: No Has patient had a PCN reaction that required hospitalization: No Has patient had a PCN reaction occurring within the last 10 years: No If all of the above answers are NO, then may proceed with Cephalosporin use.     Past Medical History:  Diagnosis Date  . Borderline personality disorder (HCC) 04/16/2013  . Cannabis abuse 2014  . Chondromalacia patellae, right knee   . Depression    Well-controlled with Zoloft   . Gestational diabetes    Has prediabetes now.  . Headache   . Heroin abuse (HCC)   . History of drug use 12/15/2017  . IDA (iron deficiency anemia)   . Migraines   . Morbid obesity with BMI of 40.0-44.9, adult (HCC)   . Nicotine  dependence with current use 06/26/2021  . Osteoarthritis, knee     Past Surgical History:  Procedure Laterality Date  . ANKLE SURGERY Left 12/07/2010  .  CESAREAN SECTION  12/07/2009  . CESAREAN SECTION WITH BILATERAL TUBAL LIGATION Bilateral 06/15/2018   Procedure: CESAREAN SECTION WITH BILATERAL TUBAL LIGATION;  Surgeon: Lovetta Debby PARAS, MD;  Location: [facility] ORS;  Service: Obstetrics;  Laterality: Bilateral;  . CHOLECYSTECTOMY N/A 09/27/2016   Procedure: LAPAROSCOPIC CHOLECYSTECTOMY WITH INTRAOPERATIVE CHOLANGIOGRAM;  Surgeon: Charlie FORBES Fell, MD;  Location: [facility] ORS;  Service: General;  Laterality:  N/A;  . DILATION AND CURETTAGE OF UTERUS  12/08/2011  . KNEE SURGERY Right 08/05/2022  . TONSILLECTOMY  12/07/2005    Social History:  reports that she has quit smoking. Her smoking use included cigarettes. She has a 13 pack-year smoking history. She has never used smokeless tobacco. She reports current alcohol use. She reports that she does not currently use drugs.  Family History:  Family History  Problem Relation Age of Onset  . Arthritis Mother   . Cancer Mother        skin  . Hyperlipidemia Mother        takes meds  . Coronary artery disease Mother        non-obstructive  . Cancer Father        Lung  . Anxiety disorder Sister   . Depression Sister   . ADD / ADHD Son   . Heart attack Maternal Grandfather 32  . Coronary artery disease Maternal Grandfather 32       s/p CABG     Prior to Admission medications   Medication Sig Start Date End Date Taking? Authorizing Provider  cephALEXin  (KEFLEX ) 500 MG capsule Take 500 mg by mouth every 12 (twelve) hours. 09/10/24  Yes [provider]  VALIUM 10 MG tablet Take 10 mg by mouth every 8 (eight) hours as needed for anxiety. 09/13/24  Yes [provider]  ADDERALL 15 MG tablet Take 1 tablet by mouth 2 (two) times daily.    [provider]  ADDERALL XR 15 MG 24 hr capsule Take 15 mg by mouth every morning.    [provider]  baclofen  (LIORESAL ) 20 MG tablet TAKE 1 TABLET(20 MG) BY MOUTH THREE TIMES DAILY 09/29/24   Melvin Pao, NP  lamoTRIgine  (LAMICTAL ) 150 MG tablet Take 1 tablet (150 mg total) by mouth daily. 06/21/24   Melvin Pao, NP  lidocaine  (LIDODERM ) 5 % Place 1 patch onto the skin daily. 06/21/24   Melvin Pao, NP  LORazepam  (ATIVAN ) 1 MG tablet Take 1 tablet (1 mg total) by mouth in the morning, at noon, and at bedtime. 02/15/24   Melvin Pao, NP  ondansetron  (ZOFRAN -ODT) 4 MG disintegrating tablet Take 1 tablet (4 mg total) by mouth every 8 (eight) hours as needed.  01/22/24   Arvis Jolan NOVAK, PA-C  sulfamethoxazole -trimethoprim  (BACTRIM  DS) 800-160 MG tablet Take 1 tablet by mouth 2 (two) times daily. 06/21/24   Melvin Pao, NP  traZODone  (DESYREL ) 150 MG tablet Take 1 tablet (150 mg total) by mouth at bedtime. 06/21/24   Melvin Pao, NP  venlafaxine  XR (EFFEXOR  XR) 150 MG 24 hr capsule Take 1 capsule (150 mg total) by mouth daily with breakfast. 06/21/24   Melvin Pao, NP    Physical Exam: Vitals:   10/07/24 1819 10/07/24 1858 10/07/24 1909 10/07/24 2233  BP:  129/86  (!) 141/85  Pulse: 62 (!) 59  (!) 53  Resp: 15 15  12   SpO2: 100% 100% 100% 100%  Weight:      Height:       General: Not in acute distress HEENT:  Eyes: PERRL, EOMI, no jaundice       ENT: No discharge from the ears and nose       Neck: No JVD, no bruit, no mass felt. Heme: No neck lymph node enlargement. Cardiac: S1/S2, RRR, No murmurs, No gallops or rubs. Respiratory: No rales, wheezing, rhonchi or rubs. GI: Soft, nondistended, nontender, no organomegaly, BS present. GU: No hematuria Ext: No pitting leg edema bilaterally. 1+DP/PT pulse bilaterally. Musculoskeletal: No joint deformities, No joint redness or warmth, no limitation of ROM in spin. Skin: No rashes.  Neuro: Confused, not arousable, not following command, cranial nerves II-XII grossly intact, moves all extremities  Psych: Could not be reviewed.  Patient is confused, agitated and combative.  Labs on Admission: I have personally reviewed following labs and imaging studies  CBC: Recent Labs  Lab 10/07/24 1745  WBC 6.3  HGB 13.0  HCT 38.7  MCV 85.4  PLT 172   Basic Metabolic Panel: Recent Labs  Lab 10/07/24 1745  NA 143  K 3.4*  CL 107  CO2 23  GLUCOSE 99  BUN 11  CREATININE 0.86  CALCIUM 8.8*  MG 2.2  PHOS 4.2   GFR: Estimated Creatinine Clearance: 90.7 mL/min (by C-G formula based on SCr of 0.86 mg/dL). Liver Function Tests: Recent Labs  Lab 10/07/24 1745  AST 19  ALT  18  ALKPHOS 53  BILITOT 1.4*  PROT 6.6  ALBUMIN 3.9   No results for input(s): LIPASE, AMYLASE in the last 168 hours. No results for input(s): AMMONIA  in the last 168 hours. Coagulation Profile: No results for input(s): INR, PROTIME in the last 168 hours. Cardiac Enzymes: No results for input(s): CKTOTAL, CKMB, CKMBINDEX, TROPONINI in the last 168 hours. BNP (last 3 results) No results for input(s): PROBNP in the last 8760 hours. HbA1C: No results for input(s): HGBA1C in the last 72 hours. CBG: Recent Labs  Lab 10/07/24 1754  GLUCAP 112*   Lipid Profile: No results for input(s): CHOL, HDL, LDLCALC, TRIG, CHOLHDL, LDLDIRECT in the last 72 hours. Thyroid  Function Tests: No results for input(s): TSH, T4TOTAL, FREET4, T3FREE, THYROIDAB in the last 72 hours. Anemia Panel: No results for input(s): VITAMINB12, FOLATE, FERRITIN, TIBC, IRON, RETICCTPCT in the last 72 hours. Urine analysis:    Component Value Date/Time   COLORURINE YELLOW (A) 03/16/2024 0500   APPEARANCEUR Clear 06/12/2024 1052   LABSPEC 1.019 03/16/2024 0500   LABSPEC 1.017 04/28/2014 1724   PHURINE 6.0 03/16/2024 0500   GLUCOSEU Negative 06/12/2024 1052   GLUCOSEU Negative 04/28/2014 1724   HGBUR NEGATIVE 03/16/2024 0500   BILIRUBINUR Negative 06/12/2024 1052   BILIRUBINUR Negative 04/28/2014 1724   KETONESUR 20 (A) 03/16/2024 0500   PROTEINUR Negative 06/12/2024 1052   PROTEINUR NEGATIVE 03/16/2024 0500   NITRITE Negative 06/12/2024 1052   NITRITE NEGATIVE 03/16/2024 0500   LEUKOCYTESUR Negative 06/12/2024 1052   LEUKOCYTESUR NEGATIVE 03/16/2024 0500   LEUKOCYTESUR Negative 04/28/2014 1724   Sepsis Labs: @LABRCNTIP (procalcitonin:4,lacticidven:4) )No results found for this or any previous visit (from the past 240 hours).   Radiological Exams on Admission:   Assessment/Plan Principal Problem:   Overdose Active Problems:   Toxic metabolic  encephalopathy   Polysubstance abuse (HCC)   Chronic obstructive pulmonary disease (HCC)   Hypokalemia   Borderline personality disorder (HCC)   Bipolar disorder (HCC)   Depression with anxiety   Obesity (BMI 30-39.9)   Assessment and Plan:  Principal Problem:   Overdose Active Problems:   Toxic metabolic encephalopathy   Polysubstance abuse (  HCC)   Chronic obstructive pulmonary disease (HCC)   Hypokalemia   Borderline personality disorder (HCC)   Bipolar disorder (HCC)   Depression with anxiety   Obesity (BMI 30-39.9)       DVT ppx:  SQ Lovenox   Code Status: Full code    Family Communication:     not done, no family member is at bed side.       Disposition Plan: To be determined  Consults called: None  Admission status and Level of care: Stepdown:  as inpt        Dispo: The patient is from: [place]              Anticipated d/c is to: To be determined              Anticipated d/c date is: 2 days              Patient currently is not medically stable to d/c.    Severity of Illness:  The appropriate patient status for this patient is INPATIENT. Inpatient status is judged to be reasonable and necessary in order to provide the required intensity of service to ensure the patient's safety. The patient's presenting symptoms, physical exam findings, and initial radiographic and laboratory data in the context of their chronic comorbidities is felt to place them at high risk for further clinical deterioration. Furthermore, it is not anticipated that the patient will be medically stable for discharge from the hospital within 2 midnights of admission.   * I certify that at the point of admission it is my clinical judgment that the patient will require inpatient hospital care spanning beyond 2 midnights from the point of admission due to high intensity of service, high risk for further deterioration and high frequency of surveillance required.*       Date of Service  10/07/2024    Caleb Exon [facility]   If 7PM-7AM, please contact night-coverage [email or web address] 10/07/2024, 11:52 PM

## 2024-10-08 DIAGNOSIS — F603 Borderline personality disorder: Secondary | ICD-10-CM | POA: Diagnosis not present

## 2024-10-08 DIAGNOSIS — F319 Bipolar disorder, unspecified: Secondary | ICD-10-CM

## 2024-10-08 DIAGNOSIS — Z046 Encounter for general psychiatric examination, requested by authority: Principal | ICD-10-CM

## 2024-10-08 DIAGNOSIS — E876 Hypokalemia: Secondary | ICD-10-CM

## 2024-10-08 DIAGNOSIS — T50902A Poisoning by unspecified drugs, medicaments and biological substances, intentional self-harm, initial encounter: Secondary | ICD-10-CM

## 2024-10-08 DIAGNOSIS — T50904A Poisoning by unspecified drugs, medicaments and biological substances, undetermined, initial encounter: Secondary | ICD-10-CM | POA: Diagnosis not present

## 2024-10-08 DIAGNOSIS — J449 Chronic obstructive pulmonary disease, unspecified: Secondary | ICD-10-CM | POA: Diagnosis not present

## 2024-10-08 LAB — BASIC METABOLIC PANEL WITH GFR
Anion gap: 12 (ref 5–15)
BUN: 10 mg/dL (ref 6–20)
CO2: 19 mmol/L — ABNORMAL LOW (ref 22–32)
Calcium: 8.5 mg/dL — ABNORMAL LOW (ref 8.9–10.3)
Chloride: 112 mmol/L — ABNORMAL HIGH (ref 98–111)
Creatinine, Ser: 0.82 mg/dL (ref 0.44–1.00)
GFR, Estimated: >60 mL/min (ref >60)
Glucose, Bld: 62 mg/dL — ABNORMAL LOW (ref 70–99)
Potassium: 3.8 mmol/L (ref 3.5–5.1)
Sodium: 143 mmol/L (ref 135–145)

## 2024-10-08 LAB — URINE DRUG SCREEN, QUALITATIVE (ARMC ONLY)
Amphetamines, Ur Screen: POSITIVE — AB
Barbiturates, Ur Screen: NOT DETECTED
Benzodiazepine, Ur Scrn: POSITIVE — AB
Cannabinoid 50 Ng, Ur ~~LOC~~: NOT DETECTED
Cocaine Metabolite,Ur ~~LOC~~: POSITIVE — AB
MDMA (Ecstasy)Ur Screen: NOT DETECTED
Methadone Scn, Ur: NOT DETECTED
Opiate, Ur Screen: NOT DETECTED
Phencyclidine (PCP) Ur S: NOT DETECTED
Tricyclic, Ur Screen: NOT DETECTED

## 2024-10-08 LAB — URINALYSIS, ROUTINE W REFLEX MICROSCOPIC
Bilirubin Urine: NEGATIVE
Glucose, UA: NEGATIVE mg/dL
Hgb urine dipstick: NEGATIVE
Ketones, ur: 20 mg/dL — AB
Leukocytes,Ua: NEGATIVE
Nitrite: POSITIVE — AB
Protein, ur: NEGATIVE mg/dL
Specific Gravity, Urine: 1.011 (ref 1.005–1.030)
pH: 6 (ref 5.0–8.0)

## 2024-10-08 LAB — CBC
HCT: 42.3 % (ref 36.0–46.0)
Hemoglobin: 13.9 g/dL (ref 12.0–15.0)
MCH: 28.6 pg (ref 26.0–34.0)
MCHC: 32.9 g/dL (ref 30.0–36.0)
MCV: 87 fL (ref 80.0–100.0)
Platelets: 163 K/uL (ref 150–400)
RBC: 4.86 MIL/uL (ref 3.87–5.11)
RDW: 14.3 % (ref 11.5–15.5)
WBC: 6.4 K/uL (ref 4.0–10.5)
nRBC: 0 % (ref 0.0–0.2)

## 2024-10-08 LAB — CBG MONITORING, ED
Glucose-Capillary: 57 mg/dL — ABNORMAL LOW (ref 70–99)
Glucose-Capillary: 76 mg/dL (ref 70–99)

## 2024-10-08 LAB — POC URINE PREG, ED: Preg Test, Ur: NEGATIVE

## 2024-10-08 MED ORDER — TRAZODONE HCL 50 MG PO TABS: 150.0000 mg | ORAL_TABLET | Freq: Every day | ORAL | Status: DC

## 2024-10-08 MED ORDER — DIAZEPAM 5 MG PO TABS: 10.0000 mg | ORAL_TABLET | Freq: Three times a day (TID) | ORAL | Status: DC | PRN

## 2024-10-08 MED ORDER — CIPROFLOXACIN HCL 500 MG PO TABS: 500.0000 mg | ORAL_TABLET | Freq: Two times a day (BID) | ORAL | 0 refills | Status: AC

## 2024-10-08 MED ORDER — VENLAFAXINE HCL ER 150 MG PO CP24: 150.0000 mg | ORAL_CAPSULE | Freq: Every day | ORAL | Status: DC

## 2024-10-08 MED ORDER — SODIUM CHLORIDE 0.9 % IV SOLN
2.0000 g | INTRAVENOUS | Status: DC
  Administered 2024-10-08: 2 g via INTRAVENOUS
  Filled 2024-10-08: qty 20

## 2024-10-08 MED ORDER — LAMOTRIGINE 25 MG PO TABS
150.0000 mg | ORAL_TABLET | Freq: Every day | ORAL | Status: DC
  Filled 2024-10-08: qty 2

## 2024-10-08 NOTE — ED Notes (Signed)
 Assisted pt to the toilet, helped back in bed and put back on the monitor. Stretcher locked in lowest position.

## 2024-10-08 NOTE — ED Notes (Signed)
 This tech and Sage the NT assisted pt to the side toilet in the room, pt tolerated walking well and collected urine sample. Urine sample sent for analysis. RN made aware.

## 2024-10-08 NOTE — Discharge Summary (Signed)
 Physician Discharge Summary  [name] FMW:[id number] DOB: 1986/08/17 DOA: 10/07/2024  PCP: Melvin Pao, NP  Admit date: 10/07/2024 Discharge date: 10/08/2024  Admitted From: Home Disposition:  Home  Recommendations for Outpatient Follow-up:  Follow up with PCP in 1-2 weeks   Home Health:No Equipment/Devices:None   Discharge Condition:Stable  CODE STATUS:FULL  Diet recommendation: Reg  Brief/Interim Summary:   38 y.o. female with medical history significant of polysubstance abuse (cocaine, heroin, marijuana), benzo overdose, COPD, former smoker, depression, bipolar, borderline personality disorder, who presents with altered mental status.   Patient has AMS, and is unable to provide accurate medical history, therefore, most of the history is obtained by discussing the case with ED physician, per EMS report, and with the nursing staff.   Per report, pt is from [facility] due to possible overdose.  Officer at bedside reported that pt was speaking Erratically and then became unresponsive in the back of the patrol car when pt was being transferd to jail after being arrested for intoxication. EMS gave 2mg  Narcan  and sternal rubs, no improvement.  Patient was given Narcan  2 mg x 2 in ED without improvement. Per RN report, while undressing patient white substance in clear baggy found in between patient's labia and glass cylinder pipe (broken in two pieces) found in patient's buttocks. Per EDP, when about to intubate the patient, she started coughing and responsive to pain stimuli, so no intubation was performed.    Psychiatry consult was engaged.  No indication for inpatient psychiatric consultation.  No contraindications to discharge from psychiatry standpoint.  Patient is otherwise medically stable.  Vital signs within normal limits.  Patient on room air.  Laboratory vesication reassuring.  Patient does have element of nitrite positivity in the UA so we will treat empirically with  3-day course of antibiotics for UTI.  Patient is stable for discharge.  IVC has been lifted prior to DC.    Discharge Diagnoses:  Principal Problem:   Overdose Active Problems:   Toxic metabolic encephalopathy   Polysubstance abuse (HCC)   Chronic obstructive pulmonary disease (HCC)   Hypokalemia   Borderline personality disorder (HCC)   Bipolar disorder (HCC)   Depression with anxiety   Obesity (BMI 30-39.9)  Toxic metabolic encephalopathy and suspected overdose: her altered mental status is likely due to overdose.  Urine drug screen positive for amphetamines, benzodiazepines, cocaine.  Patient was agitated on arrival to the ED and received droperidol and Versed  Plan: Patient is medically stable for discharge home.  Cleared from psychiatry standpoint.  Can resume home psychiatric drug regimen.  Advised on cessation of illicit substances.    Discharge Instructions  Discharge Instructions     Diet - low sodium heart healthy   Complete by: As directed    Increase activity slowly   Complete by: As directed       Allergies as of 10/08/2024       Reactions   Clindamycin/lincomycin Anaphylaxis, Rash   Gabapentin Swelling   Leg swelling   Clindamycin    Amoxicillin Rash   Has patient had a PCN reaction causing immediate rash, facial/tongue/throat swelling, SOB or lightheadedness with hypotension: Yes Has patient had a PCN reaction causing severe rash involving mucus membranes or skin necrosis: No Has patient had a PCN reaction that required hospitalization: No Has patient had a PCN reaction occurring within the last 10 years: No If all of the above answers are NO, then may proceed with Cephalosporin use.  Medication List     STOP taking these medications    cephALEXin  500 MG capsule Commonly known as: KEFLEX    lamoTRIgine  150 MG tablet Commonly known as: LaMICtal    lidocaine  5 % Commonly known as: LIDODERM    LORazepam  1 MG tablet Commonly known as:  ATIVAN    sulfamethoxazole -trimethoprim  800-160 MG tablet Commonly known as: BACTRIM  DS       TAKE these medications    Adderall XR 15 MG 24 hr capsule Generic drug: amphetamine-dextroamphetamine Take 15 mg by mouth every morning.   Adderall 15 MG tablet Generic drug: amphetamine-dextroamphetamine Take 1 tablet by mouth 2 (two) times daily.   baclofen  20 MG tablet Commonly known as: LIORESAL  TAKE 1 TABLET(20 MG) BY MOUTH THREE TIMES DAILY   ciprofloxacin  500 MG tablet Commonly known as: Cipro  Take 1 tablet (500 mg total) by mouth 2 (two) times daily for 3 days.   ondansetron  4 MG disintegrating tablet Commonly known as: ZOFRAN -ODT Take 1 tablet (4 mg total) by mouth every 8 (eight) hours as needed.   traZODone  150 MG tablet Commonly known as: DESYREL  Take 1 tablet (150 mg total) by mouth at bedtime.   Valium 10 MG tablet Generic drug: diazepam Take 10 mg by mouth every 8 (eight) hours as needed for anxiety.   venlafaxine  XR 150 MG 24 hr capsule Commonly known as: Effexor  XR Take 1 capsule (150 mg total) by mouth daily with breakfast.        Allergies  Allergen Reactions   Clindamycin/Lincomycin Anaphylaxis and Rash   Gabapentin Swelling    Leg swelling   Clindamycin    Amoxicillin Rash    Has patient had a PCN reaction causing immediate rash, facial/tongue/throat swelling, SOB or lightheadedness with hypotension: Yes Has patient had a PCN reaction causing severe rash involving mucus membranes or skin necrosis: No Has patient had a PCN reaction that required hospitalization: No Has patient had a PCN reaction occurring within the last 10 years: No If all of the above answers are NO, then may proceed with Cephalosporin use.     Consultations: Psychiatry   Procedures/Studies: CT Cervical Spine Wo Contrast Result Date: 10/07/2024 EXAM: CT CERVICAL SPINE WITHOUT CONTRAST 10/07/2024 07:11:49 PM TECHNIQUE: CT of the cervical spine was performed without the  administration of intravenous contrast. Multiplanar reformatted images are provided for review. Automated exposure control, iterative reconstruction, and/or weight based adjustment of the mA/kV was utilized to reduce the radiation dose to as low as reasonably achievable. COMPARISON: 11/01/2023 CLINICAL HISTORY: Cervical radiculopathy, no red flags; Patient presenting with unresponsiveness, etiology unknown. FINDINGS: CERVICAL SPINE: BONES AND ALIGNMENT: No acute fracture or traumatic malalignment. DEGENERATIVE CHANGES: No significant degenerative changes. SOFT TISSUES: No prevertebral soft tissue swelling. IMPRESSION: 1. No acute abnormality of the cervical spine. Electronically signed by: Franky Crease MD 10/07/2024 07:32 PM EDT RP Workstation: [id number]   CT Head Wo Contrast Result Date: 10/07/2024 EXAM: CT HEAD WITHOUT CONTRAST 10/07/2024 07:11:49 PM TECHNIQUE: CT of the head was performed without the administration of intravenous contrast. Automated exposure control, iterative reconstruction, and/or weight based adjustment of the mA/kV was utilized to reduce the radiation dose to as low as reasonably achievable. COMPARISON: 03/16/2024 CLINICAL HISTORY: Delirium FINDINGS: BRAIN AND VENTRICLES: No acute hemorrhage. No evidence of acute infarct. No hydrocephalus. No extra-axial collection. No mass effect or midline shift. ORBITS: No acute abnormality. SINUSES: No acute abnormality. SOFT TISSUES AND SKULL: No acute soft tissue abnormality. No skull fracture. IMPRESSION: 1. No acute intracranial abnormality. Electronically signed by: Franky  Dover MD 10/07/2024 07:31 PM EDT RP Workstation: [id number]   DG Chest Portable 1 View Result Date: 10/07/2024 CLINICAL DATA:  Unresponsive, intubation EXAM: PORTABLE CHEST 1 VIEW COMPARISON:  02/04/2022 FINDINGS: Single frontal view of the chest demonstrates an unremarkable cardiac silhouette. No airspace disease, effusion, or pneumothorax. There is no evidence of  endotracheal tube on this exam. No acute bony abnormalities. IMPRESSION: 1. No acute intrathoracic process. 2. Endotracheal tube, if present, is not identified on this study. Electronically Signed   By: Ozell Daring M.D.   On: 10/07/2024 19:04      Subjective: Seen and examined the day of discharge.  Stable no distress.  Appropriate discharge home.  Discharge Exam: Vitals:   10/08/24 1230 10/08/24 1245  BP:    Pulse: 91 (!) 50  Resp: (!) 21 12  Temp:    SpO2: 99% 100%   Vitals:   10/08/24 1200 10/08/24 1215 10/08/24 1230 10/08/24 1245  BP: 118/74     Pulse: 64 75 91 (!) 50  Resp: 11 (!) 22 (!) 21 12  Temp:      TempSrc:      SpO2: 98% 100% 99% 100%  Weight:      Height:        General: Pt is alert, awake, not in acute distress Cardiovascular: RRR, S1/S2 +, no rubs, no gallops Respiratory: CTA bilaterally, no wheezing, no rhonchi Abdominal: Soft, NT, ND, bowel sounds + Extremities: no edema, no cyanosis    The results of significant diagnostics from this hospitalization (including imaging, microbiology, ancillary and laboratory) are listed below for reference.     Microbiology: No results found for this or any previous visit (from the past 240 hours).   Labs: BNP (last 3 results) No results for input(s): BNP in the last 8760 hours. Basic Metabolic Panel: Recent Labs  Lab 10/07/24 1745 10/08/24 0500  NA 143 143  K 3.4* 3.8  CL 107 112*  CO2 23 19*  GLUCOSE 99 62*  BUN 11 10  CREATININE 0.86 0.82  CALCIUM 8.8* 8.5*  MG 2.2  --   PHOS 4.2  --    Liver Function Tests: Recent Labs  Lab 10/07/24 1745  AST 19  ALT 18  ALKPHOS 53  BILITOT 1.4*  PROT 6.6  ALBUMIN 3.9   No results for input(s): LIPASE, AMYLASE in the last 168 hours. No results for input(s): AMMONIA  in the last 168 hours. CBC: Recent Labs  Lab 10/07/24 1745 10/08/24 0500  WBC 6.3 6.4  HGB 13.0 13.9  HCT 38.7 42.3  MCV 85.4 87.0  PLT 172 163   Cardiac Enzymes: No  results for input(s): CKTOTAL, CKMB, CKMBINDEX, TROPONINI in the last 168 hours. BNP: Invalid input(s): POCBNP CBG: Recent Labs  Lab 10/07/24 1754 10/08/24 1044 10/08/24 1155  GLUCAP 112* 57* 76   D-Dimer No results for input(s): DDIMER in the last 72 hours. Hgb A1c No results for input(s): HGBA1C in the last 72 hours. Lipid Profile No results for input(s): CHOL, HDL, LDLCALC, TRIG, CHOLHDL, LDLDIRECT in the last 72 hours. Thyroid  function studies No results for input(s): TSH, T4TOTAL, T3FREE, THYROIDAB in the last 72 hours.  Invalid input(s): FREET3 Anemia work up No results for input(s): VITAMINB12, FOLATE, FERRITIN, TIBC, IRON, RETICCTPCT in the last 72 hours. Urinalysis    Component Value Date/Time   COLORURINE YELLOW (A) 10/08/2024 0149   APPEARANCEUR CLEAR (A) 10/08/2024 0149   APPEARANCEUR Clear 06/12/2024 1052   LABSPEC 1.011 10/08/2024 0149   LABSPEC  1.017 04/28/2014 1724   PHURINE 6.0 10/08/2024 0149   GLUCOSEU NEGATIVE 10/08/2024 0149   GLUCOSEU Negative 04/28/2014 1724   HGBUR NEGATIVE 10/08/2024 0149   BILIRUBINUR NEGATIVE 10/08/2024 0149   BILIRUBINUR Negative 06/12/2024 1052   BILIRUBINUR Negative 04/28/2014 1724   KETONESUR 20 (A) 10/08/2024 0149   PROTEINUR NEGATIVE 10/08/2024 0149   NITRITE POSITIVE (A) 10/08/2024 0149   LEUKOCYTESUR NEGATIVE 10/08/2024 0149   LEUKOCYTESUR Negative 04/28/2014 1724   Sepsis Labs Recent Labs  Lab 10/07/24 1745 10/08/24 0500  WBC 6.3 6.4   Microbiology No results found for this or any previous visit (from the past 240 hours).   Time coordinating discharge: 40 minutes  SIGNED:   Calvin KATHEE Robson, MD  [facility] 10/08/2024, 2:35 PM Pager   If 7PM-7AM, please contact night-coverage

## 2024-10-08 NOTE — Consult Note (Signed)
 [facility] Psychiatric Consult Initial  Patient Name: .[name]  MRN: [id number]  DOB: 1986/03/12  Consult Order details:  Orders (From admission, onward)     Start     Ordered   10/07/24 1811  IP CONSULT TO PSYCHIATRY       Ordering Provider: Dicky Anes, MD  Provider:  (Not yet assigned)  Question Answer Comment  Reason for consult: Other (see comments)   Comments: Possible overdose of unknown intent      10/07/24 1810             Mode of Visit: In person    Psychiatry Consult Evaluation  Service Date: October 08, 2024 LOS:  LOS: 1 day  Chief Complaint I don't know.   Primary Psychiatric Diagnoses  Borderline Personality Disorder   Assessment  [name] is a 38 y.o. female admitted: Presented to ED Brief Note:  38 y.o. female with medical history significant of polysubstance abuse (cocaine, heroin, marijuana), benzo overdose, COPD, former smoker, depression, bipolar, borderline personality disorder, who presents with altered mental status.   Patient has AMS, and is unable to provide accurate medical history, therefore, most of the history is obtained by discussing the case with ED physician, per EMS report, and with the nursing staff.   Per report, pt is from [facility] due to possible overdose.  Officer at bedside reported that pt was speaking Erratically and then became unresponsive in the back of the patrol car when pt was being transferd to jail after being arrested for intoxication. EMS gave 2mg  Narcan  and sternal rubs, no improvement.  Patient was given Narcan  2 mg x 2 in ED without improvement. Per RN report, while undressing patient white substance in clear baggy found in between patient's labia and glass cylinder pipe (broken in two pieces) found in patient's buttocks. Per EDP, when about to intubate the patient, she started coughing and responsive to pain stimuli, so no intubation was performed.  Initial Consult: Patient is assessed in the  ED and continues to be somnolent and requires frequent verbal redirection to awake up. She is oriented x4. She explains that she has history of Borderline Personality Disorder however has not had psychiatric provider for some time. Previously treated at [facility] however does not remember the last time that she was seen there. Began using illicit substances about 8 years ago off and on and crack cocaine being her drug of choice. Her last use was a couple of days ago. [name] explains that did hide crack cocaine 'between my legs.   Endorses history of anxiety and depression and takes venlafaxine , adderall, trazodone ,  and diazepam by [facility] in [place]. Currently, lives in [place] with her mother and is a safe place for her. She denies hx of current thoughts of suicide. Denies cravings at this time. Historically does not sleep well and takes trazodone  that assists with sleep. Denies AVH current or history.     Diagnoses:  Active Hospital problems: Principal Problem:   Overdose Active Problems:   Borderline personality disorder (HCC)   Chronic obstructive pulmonary disease (HCC)   Bipolar disorder (HCC)   Toxic metabolic encephalopathy   Polysubstance abuse (HCC)   Depression with anxiety   Hypokalemia   Obesity (BMI 30-39.9)    Plan   ## Psychiatric Medication Recommendations:  Restart venlafaxine  150 mg (was taking consistently prior to arrival to hospital)  ## Medical Decision Making Capacity: Not specifically addressed in this encounter  ##  Further Work-up:   -- most recent EKG on 10/07/2024 had QtC of 446 -- Pertinent labwork reviewed earlier this admission includes: CBC, CMP, UA, UDS   ## Disposition:-- There are no psychiatric contraindications to discharge at this time  ## Behavioral / Environmental: -Utilize compassion and acknowledge the patient's experiences while setting clear and realistic expectations for care.    ## Safety and  Observation Level:  - Based on my clinical evaluation, I estimate the patient to be at LOW risk of self harm in the current setting. - At this time, we recommend  routine. This decision is based on my review of the chart including patient's history and current presentation, interview of the patient, mental status examination, and consideration of suicide risk including evaluating suicidal ideation, plan, intent, suicidal or self-harm behaviors, risk factors, and protective factors. This judgment is based on our ability to directly address suicide risk, implement suicide prevention strategies, and develop a safety plan while the patient is in the clinical setting. Please contact our team if there is a concern that risk level has changed.  CSSR Risk Category:C-SSRS RISK CATEGORY: No Risk  Suicide Risk Assessment: Patient has following modifiable risk factors for suicide: recklessness, which we are addressing by encouraging therapy and communication of mental health needs. Patient has following non-modifiable or demographic risk factors for suicide: NA Patient has the following protective factors against suicide: Access to outpatient mental health care, Supportive family, and no history of suicide attempts  Thank you for this consult request. Recommendations have been communicated to the primary team.  We will recommend to restart venlafaxine , will sign off on case at this time.   Waver Dibiasio B Sophiea Ueda, NP       History of Present Illness  Relevant Aspects of Hospital ED   Patient Report:  Patient is assessed in the ED and continues to be somnolent and requires frequent verbal redirection to awake up. She is oriented x4. She explains that she has history of Borderline Personality Disorder however has not had psychiatric provider for some time. Previously treated at [facility] however does not remember the last time that she was seen there. Began using illicit substances about 8 years ago off and on and crack  cocaine being her drug of choice. Her last use was a couple of days ago. [name] explains that did hide crack cocaine 'between my legs.   Endorses history of anxiety and depression and takes venlafaxine , adderall, trazodone ,  and diazepam by [facility] in [place]. Currently, lives in [place] with her mother and is a safe place for her. She denies hx of current thoughts of suicide. Denies cravings at this time. Historically does not sleep well and takes trazodone  that assists with sleep. Denies AVH current or history.   Psych ROS:  Depression: endorses Anxiety:  endorses Mania (lifetime and current): denies Psychosis: (lifetime and current): denies  Collateral information:  Patient does not give permission    Psychiatric and Social History  Psychiatric History:  Information collected from patient and chart  Prev Dx/Sx: Borderline Personality Disorder, Bipolar Disorder, MDD, GAD, Cocaine abuse Current Psych Provider: PCP Home Meds (current): see note Previous Med Trials: unknown Therapy: hx of therapy, none current  Prior Psych Hospitalization: denies  Prior Self Harm: hx  Prior Violence: denies  Family Psych History: denies Family Hx suicide: denies  Social History:   Educational Hx: graduated from high school Occupational Hx: unemployed [employer] Hx: current charges pending Living Situation: with mother Spiritual  Hx: denies Access to weapons/lethal means: denies   Substance History Alcohol: denies  Type of alcohol denies Last Drink denies Number of drinks per day denies History of alcohol withdrawal seizures denies History of DT's denies Tobacco: current every day smoker Illicit drugs: cocaine Prescription drug abuse: denies Rehab hx: denies  Exam Findings  Physical Exam: I have reviewed and agree with previous provider exam Vital Signs:  Temp:  [97.6 F (36.4 C)-98.1 F (36.7 C)] 98.1 F (36.7 C) (11/02 1040) Pulse Rate:   [53-84] 84 (11/02 0523) Resp:  [12-17] 17 (11/02 0523) BP: (127-141)/(75-87) 127/77 (11/02 0523) SpO2:  [97 %-100 %] 97 % (11/02 0523) Weight:  [80 kg-90.7 kg] 80 kg (11/01 1806) Blood pressure 127/77, pulse 84, temperature 98.1 F (36.7 C), temperature source Oral, resp. rate 17, height 5' 4 (1.626 m), weight 80 kg, SpO2 97%. Body mass index is 30.27 kg/m.    Mental Status Exam: General Appearance: Fairly Groomed  Orientation:  Full (Time, Place, and Person)  Memory:  Immediate;   Good Recent;   Good Remote;   Good  Concentration:  Concentration: Fair and Attention Span: Fair  Recall:  Good  Attention  Fair  Eye Contact:  Good  Speech:  Clear and Coherent  Language:  Good  Volume:  Normal  Mood: sad  Affect:  Appropriate  Thought Process:  Goal Directed  Thought Content:  WDL  Suicidal Thoughts:  No  Homicidal Thoughts:  No  Judgement:  Fair  Insight:  Fair  Psychomotor Activity:  Normal  Akathisia:  No  Fund of Knowledge:  Good      Assets:  Communication Skills Housing Social Support  Cognition:  WNL  ADL's:  Intact  AIMS (if indicated):        Other History   These have been pulled in through the EMR, reviewed, and updated if appropriate.  Family History:  The patient's family history includes ADD / ADHD in her son; Anxiety disorder in her sister; Arthritis in her mother; Cancer in her father and mother; Coronary artery disease in her mother; Coronary artery disease (age of onset: 67) in her maternal grandfather; Depression in her sister; Heart attack (age of onset: 99) in her maternal grandfather; Hyperlipidemia in her mother.  Medical History: Past Medical History:  Diagnosis Date  . Borderline personality disorder (HCC) 04/16/2013  . Cannabis abuse 2014  . Chondromalacia patellae, right knee   . Depression    Well-controlled with Zoloft   . Gestational diabetes    Has prediabetes now.  . Headache   . Heroin abuse (HCC)   . History of drug use  12/15/2017  . IDA (iron deficiency anemia)   . Migraines   . Morbid obesity with BMI of 40.0-44.9, adult (HCC)   . Nicotine  dependence with current use 06/26/2021  . Osteoarthritis, knee     Surgical History: Past Surgical History:  Procedure Laterality Date  . ANKLE SURGERY Left 12/07/2010  . CESAREAN SECTION  12/07/2009  . CESAREAN SECTION WITH BILATERAL TUBAL LIGATION Bilateral 06/15/2018   Procedure: CESAREAN SECTION WITH BILATERAL TUBAL LIGATION;  Surgeon: Lovetta Debby PARAS, MD;  Location: [facility] ORS;  Service: Obstetrics;  Laterality: Bilateral;  . CHOLECYSTECTOMY N/A 09/27/2016   Procedure: LAPAROSCOPIC CHOLECYSTECTOMY WITH INTRAOPERATIVE CHOLANGIOGRAM;  Surgeon: Charlie FORBES Fell, MD;  Location: [facility] ORS;  Service: General;  Laterality: N/A;  . DILATION AND CURETTAGE OF UTERUS  12/08/2011  . KNEE SURGERY Right 08/05/2022  . TONSILLECTOMY  12/07/2005     Medications:  Current Facility-Administered Medications:  .  0.9 %  sodium chloride  infusion, , Intravenous, Continuous, Niu, Xilin, MD, Last Rate: 100 mL/hr at 10/07/24 1953, New Bag at 10/07/24 1953 .  acetaminophen  (TYLENOL ) suppository 650 mg, 650 mg, Rectal, Q6H PRN, Niu, Xilin, MD .  albuterol  (PROVENTIL ) (2.5 MG/3ML) 0.083% nebulizer solution 2.5 mg, 2.5 mg, Nebulization, Q4H PRN, Niu, Xilin, MD .  cefTRIAXone (ROCEPHIN) 2 g in sodium chloride  0.9 % 100 mL IVPB, 2 g, Intravenous, Q24H, Sreenath, Sudheer B, MD, Stopped at 10/08/24 1055 .  enoxaparin  (LOVENOX ) injection 40 mg, 40 mg, Subcutaneous, Q24H, Niu, Xilin, MD, 40 mg at 10/07/24 2340 .  OLANZapine (ZYPREXA) injection 2.5 mg, 2.5 mg, Intravenous, Once PRN, Niu, Xilin, MD .  ondansetron  (ZOFRAN ) injection 4 mg, 4 mg, Intravenous, Q8H PRN, Niu, Xilin, MD  Current Outpatient Medications:  .  cephALEXin  (KEFLEX ) 500 MG capsule, Take 500 mg by mouth every 12 (twelve) hours., Disp: , Rfl:  .  lamoTRIgine  (LAMICTAL ) 150 MG tablet, Take 1 tablet (150 mg total) by  mouth daily., Disp: 90 tablet, Rfl: 1 .  traZODone  (DESYREL ) 150 MG tablet, Take 1 tablet (150 mg total) by mouth at bedtime., Disp: 90 tablet, Rfl: 1 .  VALIUM 10 MG tablet, Take 10 mg by mouth every 8 (eight) hours as needed for anxiety., Disp: , Rfl:  .  venlafaxine  XR (EFFEXOR  XR) 150 MG 24 hr capsule, Take 1 capsule (150 mg total) by mouth daily with breakfast., Disp: 90 capsule, Rfl: 1 .  ADDERALL 15 MG tablet, Take 1 tablet by mouth 2 (two) times daily., Disp: , Rfl:  .  ADDERALL XR 15 MG 24 hr capsule, Take 15 mg by mouth every morning., Disp: , Rfl:  .  baclofen  (LIORESAL ) 20 MG tablet, TAKE 1 TABLET(20 MG) BY MOUTH THREE TIMES DAILY, Disp: 90 tablet, Rfl: 0 .  lidocaine  (LIDODERM ) 5 %, Place 1 patch onto the skin daily. (Patient not taking: Reported on 10/07/2024), Disp: 30 patch, Rfl: 2 .  LORazepam  (ATIVAN ) 1 MG tablet, Take 1 tablet (1 mg total) by mouth in the morning, at noon, and at bedtime. (Patient not taking: Reported on 10/07/2024), Disp: , Rfl:  .  ondansetron  (ZOFRAN -ODT) 4 MG disintegrating tablet, Take 1 tablet (4 mg total) by mouth every 8 (eight) hours as needed. (Patient not taking: Reported on 10/07/2024), Disp: 15 tablet, Rfl: 0 .  sulfamethoxazole -trimethoprim  (BACTRIM  DS) 800-160 MG tablet, Take 1 tablet by mouth 2 (two) times daily. (Patient not taking: Reported on 10/07/2024), Disp: 20 tablet, Rfl: 0  Allergies: Allergies  Allergen Reactions  . Clindamycin/Lincomycin Anaphylaxis and Rash  . Gabapentin Swelling    Leg swelling  . Clindamycin   . Amoxicillin Rash    Has patient had a PCN reaction causing immediate rash, facial/tongue/throat swelling, SOB or lightheadedness with hypotension: Yes Has patient had a PCN reaction causing severe rash involving mucus membranes or skin necrosis: No Has patient had a PCN reaction that required hospitalization: No Has patient had a PCN reaction occurring within the last 10 years: No If all of the above answers are NO, then  may proceed with Cephalosporin use.     Kyon Bentler B Rozetta Stumpp, NP

## 2024-10-08 NOTE — ED Notes (Signed)
 Pt provided lunch tray.

## 2024-10-08 NOTE — Progress Notes (Signed)
 PROGRESS NOTE    [name]  FMW:[id number] DOB: Mar 25, 1986 DOA: 10/07/2024 PCP: Melvin Pao, NP    Brief Narrative:   38 y.o. female with medical history significant of polysubstance abuse (cocaine, heroin, marijuana), benzo overdose, COPD, former smoker, depression, bipolar, borderline personality disorder, who presents with altered mental status.   Patient has AMS, and is unable to provide accurate medical history, therefore, most of the history is obtained by discussing the case with ED physician, per EMS report, and with the nursing staff.   Per report, pt is from [facility] due to possible overdose.  Officer at bedside reported that pt was speaking Erratically and then became unresponsive in the back of the patrol car when pt was being transferd to jail after being arrested for intoxication. EMS gave 2mg  Narcan  and sternal rubs, no improvement.  Patient was given Narcan  2 mg x 2 in ED without improvement. Per RN report, while undressing patient white substance in clear baggy found in between patient's labia and glass cylinder pipe (broken in two pieces) found in patient's buttocks. Per EDP, when about to intubate the patient, she started coughing and responsive to pain stimuli, so no intubation was performed.    Assessment & Plan:   Principal Problem:   Overdose Active Problems:   Toxic metabolic encephalopathy   Polysubstance abuse (HCC)   Chronic obstructive pulmonary disease (HCC)   Hypokalemia   Borderline personality disorder (HCC)   Bipolar disorder (HCC)   Depression with anxiety   Obesity (BMI 30-39.9)  Toxic metabolic encephalopathy and suspected overdose: her altered mental status is likely due to overdose.  Urine drug screen positive for amphetamines, benzodiazepines, cocaine.  Patient was agitated on arrival to the ED and received droperidol and Versed  Plan: Can downgrade to telemetry Continue to hold home medications for now including baclofen ,  Lamictal , Effexor , Ativan , trazodone  Psychiatry consultation appreciated Okay for diet Continue fluids Follow-up psychiatry recommendations IVC for now    Polysubstance abuse (HCC) UDS positive for amphetamines, cocaine, benzodiazepines   Chronic obstructive pulmonary disease (HCC): Currently no wheezing -As needed albuterol  nebulizer   Hypokalemia: Potassium 3.4, magnesium  2.2, phosphorus of 4.2. Monitor and replace as necessary   Borderline personality disorder, Bipolar disorder, Depression with anxiety - Continue to hold all home psych medications now due to possible overdose some of these meds.   Obesity (BMI 30-39.9): Patient has Obesity Class I, with body weight 80 Kg and BMI 30.2  kg/m2.  - need to encourage losing weight when mental status improves - Continue to exercise and healthy diet  Urinary tract infection UA nitrate positive Empiric Rocephin x 3 days     DVT prophylaxis: SQL Code Status: Full Family Communication: None Disposition Plan: Status is: Inpatient Remains inpatient appropriate because: Toxic metabolic encephalopathy and drug overdose   Level of care: Telemetry  Consultants:  Psychiatry  Procedures:  None  Antimicrobials: Ceftriaxone   Subjective: Seen and examined.  Sleepy lethargic.  Difficult to arouse.  Objective: Vitals:   10/08/24 0012 10/08/24 0241 10/08/24 0523 10/08/24 1040  BP:  129/75 127/77   Pulse:  65 84   Resp:  13 17   Temp: 97.6 F (36.4 C) 97.8 F (36.6 C)  98.1 F (36.7 C)  TempSrc: Axillary Oral  Oral  SpO2:  98% 97%   Weight:      Height:        Intake/Output Summary (Last 24 hours) at 10/08/2024 1149 Last data filed at 10/08/2024 0004 Gross per 24  hour  Intake 1196.67 ml  Output --  Net 1196.67 ml   Filed Weights   10/07/24 1739 10/07/24 1806  Weight: 90.7 kg 80 kg    Examination:  General exam: Appears lethargic Respiratory system: Clear to auscultation. Respiratory effort  normal. Cardiovascular system: S1-S2, RRR, no murmurs, no pedal edema Gastrointestinal system: Soft, dissociating, normal bowel sounds Central nervous system: Awake but lethargic.  Unable to assess orientation Extremities: Symmetric 5 x 5 power. Skin: No rashes, lesions or ulcers Psychiatry: Unable to assess    Data Reviewed: I have personally reviewed following labs and imaging studies  CBC: Recent Labs  Lab 10/07/24 1745 10/08/24 0500  WBC 6.3 6.4  HGB 13.0 13.9  HCT 38.7 42.3  MCV 85.4 87.0  PLT 172 163   Basic Metabolic Panel: Recent Labs  Lab 10/07/24 1745 10/08/24 0500  NA 143 143  K 3.4* 3.8  CL 107 112*  CO2 23 19*  GLUCOSE 99 62*  BUN 11 10  CREATININE 0.86 0.82  CALCIUM 8.8* 8.5*  MG 2.2  --   PHOS 4.2  --    GFR: Estimated Creatinine Clearance: 95.2 mL/min (by C-G formula based on SCr of 0.82 mg/dL). Liver Function Tests: Recent Labs  Lab 10/07/24 1745  AST 19  ALT 18  ALKPHOS 53  BILITOT 1.4*  PROT 6.6  ALBUMIN 3.9   No results for input(s): LIPASE, AMYLASE in the last 168 hours. No results for input(s): AMMONIA  in the last 168 hours. Coagulation Profile: No results for input(s): INR, PROTIME in the last 168 hours. Cardiac Enzymes: No results for input(s): CKTOTAL, CKMB, CKMBINDEX, TROPONINI in the last 168 hours. BNP (last 3 results) No results for input(s): PROBNP in the last 8760 hours. HbA1C: No results for input(s): HGBA1C in the last 72 hours. CBG: Recent Labs  Lab 10/07/24 1754 10/08/24 1044  GLUCAP 112* 57*   Lipid Profile: No results for input(s): CHOL, HDL, LDLCALC, TRIG, CHOLHDL, LDLDIRECT in the last 72 hours. Thyroid  Function Tests: No results for input(s): TSH, T4TOTAL, FREET4, T3FREE, THYROIDAB in the last 72 hours. Anemia Panel: No results for input(s): VITAMINB12, FOLATE, FERRITIN, TIBC, IRON, RETICCTPCT in the last 72 hours. Sepsis Labs: No results for  input(s): PROCALCITON, LATICACIDVEN in the last 168 hours.  No results found for this or any previous visit (from the past 240 hours).       Radiology Studies: CT Cervical Spine Wo Contrast Result Date: 10/07/2024 EXAM: CT CERVICAL SPINE WITHOUT CONTRAST 10/07/2024 07:11:49 PM TECHNIQUE: CT of the cervical spine was performed without the administration of intravenous contrast. Multiplanar reformatted images are provided for review. Automated exposure control, iterative reconstruction, and/or weight based adjustment of the mA/kV was utilized to reduce the radiation dose to as low as reasonably achievable. COMPARISON: 11/01/2023 CLINICAL HISTORY: Cervical radiculopathy, no red flags; Patient presenting with unresponsiveness, etiology unknown. FINDINGS: CERVICAL SPINE: BONES AND ALIGNMENT: No acute fracture or traumatic malalignment. DEGENERATIVE CHANGES: No significant degenerative changes. SOFT TISSUES: No prevertebral soft tissue swelling. IMPRESSION: 1. No acute abnormality of the cervical spine. Electronically signed by: Franky Crease MD 10/07/2024 07:32 PM EDT RP Workstation: [id number]   CT Head Wo Contrast Result Date: 10/07/2024 EXAM: CT HEAD WITHOUT CONTRAST 10/07/2024 07:11:49 PM TECHNIQUE: CT of the head was performed without the administration of intravenous contrast. Automated exposure control, iterative reconstruction, and/or weight based adjustment of the mA/kV was utilized to reduce the radiation dose to as low as reasonably achievable. COMPARISON: 03/16/2024 CLINICAL HISTORY: Delirium FINDINGS: BRAIN  AND VENTRICLES: No acute hemorrhage. No evidence of acute infarct. No hydrocephalus. No extra-axial collection. No mass effect or midline shift. ORBITS: No acute abnormality. SINUSES: No acute abnormality. SOFT TISSUES AND SKULL: No acute soft tissue abnormality. No skull fracture. IMPRESSION: 1. No acute intracranial abnormality. Electronically signed by: Franky Crease MD 10/07/2024 07:31 PM  EDT RP Workstation: [id number]   DG Chest Portable 1 View Result Date: 10/07/2024 CLINICAL DATA:  Unresponsive, intubation EXAM: PORTABLE CHEST 1 VIEW COMPARISON:  02/04/2022 FINDINGS: Single frontal view of the chest demonstrates an unremarkable cardiac silhouette. No airspace disease, effusion, or pneumothorax. There is no evidence of endotracheal tube on this exam. No acute bony abnormalities. IMPRESSION: 1. No acute intrathoracic process. 2. Endotracheal tube, if present, is not identified on this study. Electronically Signed   By: Ozell Daring M.D.   On: 10/07/2024 19:04        Scheduled Meds:  enoxaparin  (LOVENOX ) injection  40 mg Subcutaneous Q24H   Continuous Infusions:  sodium chloride  100 mL/hr at 10/07/24 1953   cefTRIAXone (ROCEPHIN)  IV Stopped (10/08/24 1055)     LOS: 1 day    Calvin KATHEE Robson, MD [facility]   If 7PM-7AM, please contact night-coverage  10/08/2024, 11:49 AM

## 2024-10-09 LAB — LAMOTRIGINE LEVEL: Lamotrigine Lvl: <1 ug/mL — ABNORMAL LOW (ref 2.0–20.0)

## 2024-11-20 ENCOUNTER — Other Ambulatory Visit: Payer: Self-pay | Admitting: Nurse Practitioner

## 2024-11-22 NOTE — Telephone Encounter (Signed)
 Requested Prescriptions  Pending Prescriptions Disp Refills   baclofen  (LIORESAL ) 20 MG tablet [Pharmacy Med Name: BACLOFEN  20MG  TABLETS] 90 tablet 0    Sig: TAKE 1 TABLET(20 MG) BY MOUTH THREE TIMES DAILY     Analgesics:  Muscle Relaxants - baclofen  Failed - 11/22/2024  3:25 PM      Failed - Valid encounter within last 6 months    Recent Outpatient Visits           5 months ago Borderline personality disorder [facility])   [place] [facility] Melvin Pao, NP   5 months ago Localized swelling of left lower extremity   [facility] Melvin Pao, NP   9 months ago Borderline personality disorder [facility])   [place] [facility] Melvin Pao, NP              Passed - Cr in normal range and within 180 days    Creatinine  Date Value Ref Range Status  04/28/2014 0.79 0.60 - 1.30 mg/dL Final   Creatinine, Ser  Date Value Ref Range Status  10/08/2024 0.82 0.44 - 1.00 mg/dL Final         Passed - eGFR is 30 or above and within 180 days    EGFR (African American)  Date Value Ref Range Status  04/28/2014 >60  Final   GFR calc Af Amer  Date Value Ref Range Status  11/22/2018 >60 >60 mL/min Final   EGFR (Non-African Amer.)  Date Value Ref Range Status  04/28/2014 >60  Final    Comment:    eGFR values <72mL/min/1.73 m2 may be an indication of chronic kidney disease (CKD). Calculated eGFR is useful in patients with stable renal function. The eGFR calculation will not be reliable in acutely ill patients when serum creatinine is changing rapidly. It is not useful in  patients on dialysis. The eGFR calculation may not be applicable to patients at the low and high extremes of body sizes, pregnant women, and vegetarians.    GFR, Estimated  Date Value Ref Range Status  10/08/2024 >60 >60 mL/min Final    Comment:    (NOTE) Calculated using the CKD-EPI Creatinine Equation (2021)    eGFR  Date Value Ref Range  Status  06/12/2024 100 >59 mL/min/1.73 Final

## 2024-11-27 NOTE — Telephone Encounter (Signed)
 Pt scheduled next available appt, wants a supply called in to bridge her until her upcoming appt. Please advise

## 2024-12-06 ENCOUNTER — Ambulatory Visit
